# Patient Record
Sex: Female | Born: 1985 | Race: White | Hispanic: No | Smoking: Former smoker
Health system: Southern US, Community
[De-identification: ages and names within clinical notes are randomized; demographics above are authoritative.]

## PROBLEM LIST (undated history)

## (undated) ENCOUNTER — Inpatient Hospital Stay (HOSPITAL_COMMUNITY): Payer: Self-pay

## (undated) DIAGNOSIS — F32A Depression, unspecified: Secondary | ICD-10-CM

## (undated) DIAGNOSIS — I499 Cardiac arrhythmia, unspecified: Secondary | ICD-10-CM

## (undated) DIAGNOSIS — R569 Unspecified convulsions: Secondary | ICD-10-CM

## (undated) DIAGNOSIS — F419 Anxiety disorder, unspecified: Secondary | ICD-10-CM

## (undated) DIAGNOSIS — F329 Major depressive disorder, single episode, unspecified: Secondary | ICD-10-CM

## (undated) DIAGNOSIS — R5383 Other fatigue: Secondary | ICD-10-CM

## (undated) DIAGNOSIS — K648 Other hemorrhoids: Secondary | ICD-10-CM

## (undated) DIAGNOSIS — K8020 Calculus of gallbladder without cholecystitis without obstruction: Secondary | ICD-10-CM

## (undated) DIAGNOSIS — F4310 Post-traumatic stress disorder, unspecified: Secondary | ICD-10-CM

## (undated) DIAGNOSIS — R51 Headache: Secondary | ICD-10-CM

## (undated) DIAGNOSIS — E039 Hypothyroidism, unspecified: Secondary | ICD-10-CM

## (undated) DIAGNOSIS — O24419 Gestational diabetes mellitus in pregnancy, unspecified control: Secondary | ICD-10-CM

## (undated) HISTORY — DX: Calculus of gallbladder without cholecystitis without obstruction: K80.20

## (undated) HISTORY — DX: Other hemorrhoids: K64.8

## (undated) HISTORY — DX: Headache: R51

## (undated) HISTORY — DX: Hypothyroidism, unspecified: E03.9

## (undated) HISTORY — PX: CHOLECYSTECTOMY: SHX55

## (undated) HISTORY — DX: Depression, unspecified: F32.A

## (undated) HISTORY — PX: WISDOM TOOTH EXTRACTION: SHX21

## (undated) HISTORY — DX: Anxiety disorder, unspecified: F41.9

## (undated) HISTORY — DX: Major depressive disorder, single episode, unspecified: F32.9

## (undated) HISTORY — DX: Cardiac arrhythmia, unspecified: I49.9

## (undated) HISTORY — DX: Other fatigue: R53.83

## (undated) HISTORY — DX: Post-traumatic stress disorder, unspecified: F43.10

---
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
## 2005-02-12 ENCOUNTER — Emergency Department (HOSPITAL_COMMUNITY): Admission: EM | Admit: 2005-02-12 | Discharge: 2005-02-12 | Payer: Self-pay | Admitting: Emergency Medicine

## 2011-05-21 ENCOUNTER — Emergency Department (HOSPITAL_COMMUNITY)
Admission: EM | Admit: 2011-05-21 | Discharge: 2011-05-22 | Disposition: A | Discharge status: Home / Self Care | Payer: Federal, State, Local not specified - PPO | Attending: Emergency Medicine | Admitting: Emergency Medicine

## 2011-05-21 DIAGNOSIS — E669 Obesity, unspecified: Secondary | ICD-10-CM | POA: Insufficient documentation

## 2011-05-21 DIAGNOSIS — R079 Chest pain, unspecified: Secondary | ICD-10-CM | POA: Insufficient documentation

## 2011-05-21 DIAGNOSIS — G43909 Migraine, unspecified, not intractable, without status migrainosus: Secondary | ICD-10-CM | POA: Insufficient documentation

## 2011-05-21 DIAGNOSIS — E039 Hypothyroidism, unspecified: Secondary | ICD-10-CM | POA: Insufficient documentation

## 2011-05-21 DIAGNOSIS — R42 Dizziness and giddiness: Secondary | ICD-10-CM | POA: Insufficient documentation

## 2011-05-21 DIAGNOSIS — F411 Generalized anxiety disorder: Secondary | ICD-10-CM | POA: Insufficient documentation

## 2011-05-21 DIAGNOSIS — I498 Other specified cardiac arrhythmias: Secondary | ICD-10-CM | POA: Insufficient documentation

## 2011-05-22 ENCOUNTER — Emergency Department (HOSPITAL_COMMUNITY): Payer: Federal, State, Local not specified - PPO

## 2011-05-22 LAB — HCG, SERUM, QUALITATIVE: Preg, Serum: NEGATIVE

## 2011-05-22 LAB — POCT I-STAT, CHEM 8
BUN: 8 mg/dL (ref 6–23)
Calcium, Ion: 1.05 mmol/L — ABNORMAL LOW (ref 1.12–1.32)
Chloride: 110 mEq/L (ref 96–112)
Creatinine, Ser: 0.7 mg/dL (ref 0.50–1.10)
Glucose, Bld: 102 mg/dL — ABNORMAL HIGH (ref 70–99)
HCT: 35 % — ABNORMAL LOW (ref 36.0–46.0)
Hemoglobin: 11.9 g/dL — ABNORMAL LOW (ref 12.0–15.0)
Potassium: 3.8 mEq/L (ref 3.5–5.1)
Sodium: 138 mEq/L (ref 135–145)
TCO2: 18 mmol/L (ref 0–100)

## 2011-05-22 MED ORDER — IOHEXOL 300 MG/ML  SOLN
100.0000 mL | Freq: Once | INTRAMUSCULAR | Status: AC | PRN
  Administered 2011-05-22: 100 mL via INTRAVENOUS

## 2011-07-20 ENCOUNTER — Ambulatory Visit (INDEPENDENT_AMBULATORY_CARE_PROVIDER_SITE_OTHER): Payer: Federal, State, Local not specified - PPO | Admitting: Psychology

## 2011-07-20 DIAGNOSIS — F4310 Post-traumatic stress disorder, unspecified: Secondary | ICD-10-CM

## 2011-07-29 ENCOUNTER — Encounter (HOSPITAL_COMMUNITY): Payer: Federal, State, Local not specified - PPO | Admitting: Psychology

## 2011-08-03 ENCOUNTER — Encounter (INDEPENDENT_AMBULATORY_CARE_PROVIDER_SITE_OTHER): Payer: Federal, State, Local not specified - PPO | Admitting: Psychology

## 2011-08-03 DIAGNOSIS — F4310 Post-traumatic stress disorder, unspecified: Secondary | ICD-10-CM

## 2011-08-19 ENCOUNTER — Other Ambulatory Visit (HOSPITAL_COMMUNITY): Payer: Self-pay

## 2011-08-22 ENCOUNTER — Ambulatory Visit (INDEPENDENT_AMBULATORY_CARE_PROVIDER_SITE_OTHER): Payer: Federal, State, Local not specified - PPO | Admitting: Physician Assistant

## 2011-08-22 ENCOUNTER — Encounter (HOSPITAL_COMMUNITY): Payer: Self-pay | Admitting: Physician Assistant

## 2011-08-22 DIAGNOSIS — F4310 Post-traumatic stress disorder, unspecified: Secondary | ICD-10-CM

## 2011-08-22 DIAGNOSIS — F332 Major depressive disorder, recurrent severe without psychotic features: Secondary | ICD-10-CM

## 2011-08-22 DIAGNOSIS — F339 Major depressive disorder, recurrent, unspecified: Secondary | ICD-10-CM | POA: Insufficient documentation

## 2011-08-22 DIAGNOSIS — E039 Hypothyroidism, unspecified: Secondary | ICD-10-CM

## 2011-08-22 MED ORDER — ARIPIPRAZOLE 5 MG PO TABS: 5.0000 mg | ORAL_TABLET | Freq: Every day | ORAL | Status: DC

## 2011-08-22 MED ORDER — HYDROXYZINE HCL 25 MG PO TABS: 25.0000 mg | ORAL_TABLET | Freq: Three times a day (TID) | ORAL | Status: DC | PRN

## 2011-08-22 NOTE — Patient Instructions (Signed)
Pt. To get her labs today as well as her medication. She has card to contact us if her situation worsens.
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
## 2011-08-23 ENCOUNTER — Encounter (HOSPITAL_COMMUNITY): Payer: Self-pay | Admitting: Psychology

## 2011-08-23 ENCOUNTER — Ambulatory Visit (INDEPENDENT_AMBULATORY_CARE_PROVIDER_SITE_OTHER): Payer: Federal, State, Local not specified - PPO | Admitting: Psychology

## 2011-08-23 DIAGNOSIS — F4310 Post-traumatic stress disorder, unspecified: Secondary | ICD-10-CM
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
## 2011-08-30 ENCOUNTER — Telehealth (HOSPITAL_COMMUNITY): Payer: Self-pay | Admitting: Physician Assistant

## 2011-08-30 ENCOUNTER — Other Ambulatory Visit (HOSPITAL_COMMUNITY): Payer: Self-pay | Admitting: Physician Assistant

## 2011-08-31 LAB — CBC WITH DIFFERENTIAL/PLATELET
Basophils Relative: 0 % (ref 0–1)
Eosinophils Absolute: 0.1 10*3/uL (ref 0.0–0.7)
Lymphs Abs: 3.2 10*3/uL (ref 0.7–4.0)
MCH: 27.7 pg (ref 26.0–34.0)
Neutro Abs: 3.6 10*3/uL (ref 1.7–7.7)
Neutrophils Relative %: 49 % (ref 43–77)
Platelets: 333 10*3/uL (ref 150–400)
RBC: 4.59 MIL/uL (ref 3.87–5.11)

## 2011-08-31 LAB — COMPREHENSIVE METABOLIC PANEL
ALT: 26 U/L (ref 0–35)
Alkaline Phosphatase: 100 U/L (ref 39–117)
Sodium: 140 mEq/L (ref 135–145)
Total Bilirubin: 0.2 mg/dL — ABNORMAL LOW (ref 0.3–1.2)
Total Protein: 6.1 g/dL (ref 6.0–8.3)

## 2011-09-01 ENCOUNTER — Ambulatory Visit (INDEPENDENT_AMBULATORY_CARE_PROVIDER_SITE_OTHER): Payer: Federal, State, Local not specified - PPO | Admitting: Physician Assistant

## 2011-09-01 DIAGNOSIS — F4310 Post-traumatic stress disorder, unspecified: Secondary | ICD-10-CM

## 2011-09-01 MED ORDER — VITAMIN D3 1.25 MG (50000 UT) PO CAPS: 50000.0000 [IU] | ORAL_CAPSULE | ORAL | Status: DC

## 2011-09-01 MED ORDER — LEVOTHYROXINE SODIUM 112 MCG PO TABS: 100.0000 ug | ORAL_TABLET | Freq: Every day | ORAL | Status: DC

## 2011-09-06 ENCOUNTER — Ambulatory Visit (INDEPENDENT_AMBULATORY_CARE_PROVIDER_SITE_OTHER): Payer: Federal, State, Local not specified - PPO | Admitting: Psychology

## 2011-09-06 DIAGNOSIS — F4310 Post-traumatic stress disorder, unspecified: Secondary | ICD-10-CM

## 2011-09-06 NOTE — Progress Notes (Signed)
   THERAPIST PROGRESS NOTE  Session Time: 10.35am-11:20am  Participation Level: Active  Behavioral Response: Well GroomedAlertEuthymic  Type of Therapy: Individual Therapy  Treatment Goals addressed: Diagnosis: PTSD and goal1.   Interventions: CBT and Strength-based  Summary: Tekoa Hamor Rafati is a 25 y.o. female who presents with full and bright affect.  Pt reports improvement w/ her mood- less anxious, less depressed.  Pt informed that she has been sleeping better w/in the past week- sleeping at night, awake during the day- accomplished by keeping self up during the day.  Pt expressed insight that have mom out of town has been beneficial for her as feels like wanting to do things, feeling useful today w/ completing a morning routine and not worry about how mom is going to react to her going out.  Pt discussed feeling guilty when mom around following mom commenting on leaving her alone.  Pt committed to having self care activities w/ increased social activities this week.    Suicidal/Homicidal: Nowithout intent/plan  Therapist Response: assessed pt current functioning per her report.  Reflected pt improved mood and assisted in identifying contributing factors- including improved sleep.  Processed w/ pt interactions w/ mom and effect having on her mood and how to focus on her positive self care for her benefit w/ mood.   Plan: Return again in 1 weeks.  Diagnosis: Axis I: Post Traumatic Stress Disorder    Axis II: Deferred    Jaanai Salemi, LPC 09/06/2011

## 2011-09-13 ENCOUNTER — Ambulatory Visit (INDEPENDENT_AMBULATORY_CARE_PROVIDER_SITE_OTHER): Payer: Federal, State, Local not specified - PPO | Admitting: Psychology

## 2011-09-13 ENCOUNTER — Encounter (HOSPITAL_COMMUNITY): Payer: Self-pay | Admitting: Psychology

## 2011-09-13 DIAGNOSIS — F4310 Post-traumatic stress disorder, unspecified: Secondary | ICD-10-CM

## 2011-09-13 NOTE — Progress Notes (Signed)
   THERAPIST PROGRESS NOTE  Session Time: 11.30am-12:25pm  Participation Level: Active  Behavioral Response: Well GroomedAlertAnxious  Type of Therapy: Individual Therapy  Treatment Goals addressed: Diagnosis: PTSD and goal 1.  Interventions: CBT and Reframing  Summary: Maelys Kinnick Rafati is a 25 y.o. female who presents with full and bright affect.  Pt reports feeling stressed and still experiencing worry.  Pt reported w/ mom returning from out of town more stress as 'can't do anything right".  Pt reported on mom irritability and critical of what she is saying and doing.  Pt was able to increase awareness that this potential more reflection on mom's mood than on pt's actions.  Pt reported enjoying contact w/ her friend last week and felt very good to be social- w/ lack of transportation less ability this week.  Pt wants to return to school and financial barriers that getting in the way of making steps towards.  Pt discussed lack of joy about upcoming holidays as can't buy gifts for others.  Pt receptive to exploring other ways of giving that not material and creating a positive for self for the holidays.   Suicidal/Homicidal: Nowithout intent/plan  Therapist Response: Assessed pt current functioning per her report.  Processed w/pt interactions between pt and mom and explored external factors that pt not in control of.  Explored w/ pt positive self care w/ social interaction last week and how to best maintain given lack of transportation.  Processed w/ pt financial barriers and other options for assistance of attempting goals of returning to education.  Encourage pt to identify things grateful for and creating experiences for self that can look forward to.   Plan: Return again in 2-3 weeks.  Diagnosis: Axis I: Post Traumatic Stress Disorder    Axis II: Deferred    YATES,LEANNE, LPC 09/13/2011

## 2011-10-03 ENCOUNTER — Ambulatory Visit (INDEPENDENT_AMBULATORY_CARE_PROVIDER_SITE_OTHER): Payer: Federal, State, Local not specified - PPO | Admitting: Physician Assistant

## 2011-10-03 DIAGNOSIS — F4310 Post-traumatic stress disorder, unspecified: Secondary | ICD-10-CM

## 2011-10-03 MED ORDER — HYDROXYZINE HCL 25 MG PO TABS: 25.0000 mg | ORAL_TABLET | Freq: Three times a day (TID) | ORAL | Status: DC | PRN

## 2011-10-03 MED ORDER — FLUOXETINE HCL 40 MG PO CAPS: 40.0000 mg | ORAL_CAPSULE | Freq: Two times a day (BID) | ORAL | Status: DC

## 2011-10-03 NOTE — Progress Notes (Signed)
   [facility] Follow-up Outpatient Visit  Michele Armstrong 07-05-1986  Date: 10/03/11   Subjective: Michele Armstrong presents today to followup on her PTSD. She reports that she is having difficulty sleeping and when she does sleep she has nightmares. She is also having a difficult time going out in public. She especially has trouble being around men. The only social outlet she is participating in is to see her friend on a weekly basis, and when her friend's boyfriend comes over, Michele Armstrong feels the need to leave. She reports that she is once a day and that usually consists of a salad or other vegetables. She denies any suicidal or homicidal ideation. She denies any auditory or visual hallucinations. She is taking the Vistaril 3 times daily, but reports that it does not help with anxiety. She does find that it helps with her allergies. The Topamax prescribed to her by her primary care provider is for migraine prophylaxis. She has been on 40 mg of Prozac for about 7 months. She has tried Paxil in the past, but reports that that caused her to become irritable and aggressive. She also has tried Lexapro, but had side effects to it that were intolerable. She denies trying Celexa or Zoloft in the past  There were no vitals filed for this visit.  Mental Status Examination  Appearance: Well groomed and casually dressed Alert: Yes Attention: good  Cooperative: Yes Eye Contact: Good Speech: Clear and even Psychomotor Activity: Normal Memory/Concentration: Intact Oriented: person, place, time/date and situation Mood: Anxious Affect: Full Range Thought Processes and Associations: Linear Fund of Knowledge: Good Thought Content:  Insight: Fair Judgement: Good  Diagnosis: PTSD  Treatment Plan: We will increase her Prozac to a total of 80 mg a day and Michele Armstrong will return for followup in one month at that time, we may consider changing Prozac to another SSRI, and/or adding Minipress.  Jeren Dufrane, PA

## 2011-10-08 ENCOUNTER — Telehealth (HOSPITAL_COMMUNITY): Payer: Self-pay | Admitting: Physician Assistant

## 2011-10-10 NOTE — Telephone Encounter (Signed)
Left msg. For patient to call unit on Monday for results. Rona Ravens. Maninder Deboer [facility]  10/09/2011

## 2011-10-12 ENCOUNTER — Ambulatory Visit (INDEPENDENT_AMBULATORY_CARE_PROVIDER_SITE_OTHER): Payer: Federal, State, Local not specified - PPO | Admitting: Psychology

## 2011-10-12 ENCOUNTER — Other Ambulatory Visit (HOSPITAL_COMMUNITY): Payer: Self-pay | Admitting: Physician Assistant

## 2011-10-12 DIAGNOSIS — F4310 Post-traumatic stress disorder, unspecified: Secondary | ICD-10-CM

## 2011-10-12 MED ORDER — FLUOXETINE HCL 40 MG PO CAPS: 40.0000 mg | ORAL_CAPSULE | Freq: Two times a day (BID) | ORAL | Status: DC

## 2011-10-12 NOTE — Progress Notes (Addendum)
   THERAPIST PROGRESS NOTE  Session Time: 1pm- 1:50pm  Participation Level: Active  Behavioral Response: Well GroomedAlertEuthymic  Type of Therapy: Individual Therapy  Treatment Goals addressed: Diagnosis: PTSD and goal 1.  Interventions: CBT, Reframing and Other: Challenging Cognitive Distortions and Strength Based Tx.  Summary: Michele Armstrong is a 26 y.o. female who presents with full and bright affect.  Pt reports still struggling w/ anxiety, feeling also worthless and pt also reported some increased compulsive symptoms of counting.  Pt reported that she has maintained contact w/ her friend and reported visiting 2 x in past week- improvement.  Pt reported also positive interaction w/ 2 friends from past that were positive during the holidays.  Pt gained insight into the positive effects of social interactions as well as recent dancing for mood.  Pt made several statements of negative self talk in session about expectations had for self and not meeting "feelign as if failure".  Pt was able to increased awareness of negative impact of these statements, and reframe about efforts she is making.  Suicidal/Homicidal: Nowithout intent/plan  Therapist Response: Assessed pt current functioning per her report.  Processed w/pt interactions w/ friends and related to benefits for mood.  Explored w/ pt other positives that she can focus on for self to recognize efforts and challenge her cognitive distortions.  Reflected negative self statements in session pt making and encouraged pt reframing.  Plan: Return again in 1 weeks.  Diagnosis: Axis I: Post Traumatic Stress Disorder    Axis II: Deferred    Margretta Sidle 10/12/2011  [facility] Outpatient Therapist Documentation Restriction  Forde Radon, [facility] 11/15/2011

## 2011-10-19 ENCOUNTER — Ambulatory Visit (HOSPITAL_COMMUNITY): Payer: Federal, State, Local not specified - PPO | Admitting: Psychology

## 2011-10-19 DIAGNOSIS — F4310 Post-traumatic stress disorder, unspecified: Secondary | ICD-10-CM

## 2011-10-26 NOTE — Progress Notes (Signed)
Addended by: Webber Michiels M on: 10/26/2011 10:09 AM   Modules accepted: Level of Service  

## 2011-11-03 ENCOUNTER — Ambulatory Visit (INDEPENDENT_AMBULATORY_CARE_PROVIDER_SITE_OTHER): Payer: Federal, State, Local not specified - PPO | Admitting: Physician Assistant

## 2011-11-03 DIAGNOSIS — F4310 Post-traumatic stress disorder, unspecified: Secondary | ICD-10-CM

## 2011-11-03 MED ORDER — PRAZOSIN HCL 1 MG PO CAPS: 1.0000 mg | ORAL_CAPSULE | Freq: Every day | ORAL | Status: DC

## 2011-11-03 MED ORDER — SERTRALINE HCL 50 MG PO TABS: ORAL_TABLET | ORAL | Status: DC

## 2011-11-03 MED ORDER — HYDROXYZINE HCL 25 MG PO TABS: 25.0000 mg | ORAL_TABLET | Freq: Three times a day (TID) | ORAL | Status: DC | PRN

## 2011-11-07 NOTE — Progress Notes (Signed)
   [facility] Follow-up Outpatient Visit  Michele Armstrong [birth year]  Date: 11/03/11   Subjective: Michele Armstrong presents today to follow up on her medications for PTSD. She endorses increased depression and anxiety since her last visit. She reports she is not sleeping well and having nightmares. She reports that she has begun to drink thought to, approximately 1-1/2 cups, daily to assist with sleep. She denies any suicidal or homicidal ideation she denies any auditory or visual hallucinations.  There were no vitals filed for this visit.  Mental Status Examination  Appearance: Well groomed and casually dressed Alert: Yes Attention: good  Cooperative: Yes Eye Contact: Good Speech: Clear and even Psychomotor Activity: Normal Memory/Concentration: Intact Oriented: person, place, time/date and situation Mood: Anxious and Depressed Affect: Non-Congruent Thought Processes and Associations: Circumstantial and Logical Fund of Knowledge: Good Thought Content:  Insight: Fair Judgement: Good  Diagnosis: PTSD  Treatment Plan: We will change her Prozac to Zoloft, and a cross taper. We will also start her on prazosin for nightmares. She will followup in one month  Jacquan Savas, PA

## 2011-11-08 ENCOUNTER — Ambulatory Visit (INDEPENDENT_AMBULATORY_CARE_PROVIDER_SITE_OTHER): Payer: Federal, State, Local not specified - PPO | Admitting: Psychology

## 2011-11-08 DIAGNOSIS — F4310 Post-traumatic stress disorder, unspecified: Secondary | ICD-10-CM

## 2011-11-08 DIAGNOSIS — F339 Major depressive disorder, recurrent, unspecified: Secondary | ICD-10-CM
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
## 2011-11-22 ENCOUNTER — Ambulatory Visit (INDEPENDENT_AMBULATORY_CARE_PROVIDER_SITE_OTHER): Payer: Federal, State, Local not specified - PPO | Admitting: Psychology

## 2011-11-22 DIAGNOSIS — F4310 Post-traumatic stress disorder, unspecified: Secondary | ICD-10-CM

## 2011-11-22 DIAGNOSIS — F339 Major depressive disorder, recurrent, unspecified: Secondary | ICD-10-CM

## 2011-11-22 NOTE — Progress Notes (Signed)
   THERAPIST PROGRESS NOTE  Session Time: 10.35am-11:25am  Participation Level: Active  Behavioral Response: Well GroomedAlertDepressed  Type of Therapy: Individual Therapy  Treatment Goals addressed: Diagnosis: PTSD and goal 1.  Interventions: CBT and Reframing  Summary: Williemae Muriel Rafati is a 26 y.o. female who presents with depressed mood and affect.  Pt reported that struggling after recently reconnecting w/ a friend through face book and learning of his good fortunes this past year.  Pt in session was comparing herself to him and putting self down for not making same progress this year.  Pt was able to refocus w/ counselor assistance to recognize cognitive distortions, challenge self on these, identify progresses she is making and recognize positives of recent.  Pt discussed positive of getting past anxiety to get onto facebook and had good awareness of not reconnecting w/ unhealthy relationships, potential need to block others and how she is reconnecting w/ her positive supports.  Suicidal/Homicidal: Nowithout intent/plan  Therapist Response: Assessed pt current functioning per her report.  Processed w/ pt recent contact w/ friend through facebook, had pt identify cognitive distortions and assisted in reframing to focus on positives.  Warned against reconnecting w/ unhealthy relationships.  Plan: Return again in 1 weeks.  Diagnosis: Axis I: Major Depression, Recurrent  and Post Traumatic Stress Disorder    Axis II: No diagnosis    Rena Hunke, LPC 11/22/2011

## 2011-11-29 ENCOUNTER — Ambulatory Visit (HOSPITAL_COMMUNITY): Payer: Self-pay | Admitting: Psychology

## 2011-12-06 ENCOUNTER — Ambulatory Visit (INDEPENDENT_AMBULATORY_CARE_PROVIDER_SITE_OTHER): Payer: Federal, State, Local not specified - PPO | Admitting: Physician Assistant

## 2011-12-06 DIAGNOSIS — F331 Major depressive disorder, recurrent, moderate: Secondary | ICD-10-CM

## 2011-12-06 DIAGNOSIS — F4310 Post-traumatic stress disorder, unspecified: Secondary | ICD-10-CM

## 2011-12-06 MED ORDER — ALPRAZOLAM 0.5 MG PO TABS: 0.5000 mg | ORAL_TABLET | Freq: Two times a day (BID) | ORAL | Status: AC | PRN

## 2011-12-06 MED ORDER — PRAZOSIN HCL 1 MG PO CAPS: 1.0000 mg | ORAL_CAPSULE | Freq: Every day | ORAL | Status: DC

## 2011-12-06 MED ORDER — SERTRALINE HCL 100 MG PO TABS: 200.0000 mg | ORAL_TABLET | Freq: Every day | ORAL | Status: DC
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
## 2011-12-08 ENCOUNTER — Telehealth (HOSPITAL_COMMUNITY): Payer: Self-pay | Admitting: Psychology

## 2011-12-08 ENCOUNTER — Ambulatory Visit (HOSPITAL_COMMUNITY): Payer: Self-pay | Admitting: Psychology

## 2011-12-08 NOTE — Telephone Encounter (Signed)
Pt called crying and stated she is sorry to miss appointment this morning.  Pt was in hysterics when first on phone call and difficult to understand.  Pt was able to calm during phone call w/ counselor assistance.  She expressed struggling w/ a lot of anxiety anytime she attempts to leave the house.  Pt felt might need to be in hospital as anxiety so heightened.  Pt denied any SI or thoughts of life not worth living.  Counselor did inform of crisis services through assessment and how to access. Counselor encouraged pt to reschedule appointment and perhaps have a support accompany her.  Pt agreed.

## 2011-12-19 ENCOUNTER — Ambulatory Visit (INDEPENDENT_AMBULATORY_CARE_PROVIDER_SITE_OTHER): Payer: Federal, State, Local not specified - PPO | Admitting: Psychology

## 2011-12-19 DIAGNOSIS — F339 Major depressive disorder, recurrent, unspecified: Secondary | ICD-10-CM

## 2011-12-19 DIAGNOSIS — F4310 Post-traumatic stress disorder, unspecified: Secondary | ICD-10-CM
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
## 2011-12-26 ENCOUNTER — Ambulatory Visit (INDEPENDENT_AMBULATORY_CARE_PROVIDER_SITE_OTHER): Payer: Federal, State, Local not specified - PPO | Admitting: Psychology

## 2011-12-26 DIAGNOSIS — F4310 Post-traumatic stress disorder, unspecified: Secondary | ICD-10-CM

## 2011-12-26 DIAGNOSIS — F339 Major depressive disorder, recurrent, unspecified: Secondary | ICD-10-CM

## 2011-12-26 NOTE — Progress Notes (Signed)
   THERAPIST PROGRESS NOTE  Session Time: 11.25am-12:18pm  Participation Level: Active  Behavioral Response: CasualAlertDepressed and Euthymic  Type of Therapy: Individual Therapy  Treatment Goals addressed: Diagnosis: MDD, PTSD, and goal 1.  Interventions: CBT, Motivational Interviewing and Other: Positive Self Care.  Summary: Michele Armstrong is a 26 y.o. female who presents with generally bright affect.  Pt reported just getting over the stomach flu over the weekend and so feeling better, but first time out of the house.  Pt reported no longer feeling strong sexually urges.  Pt denies any manic symptoms.  Pt reports still feeling depressed moods- when counselor reflected bright affect- and not comfortable driving.  Pt did report enjoying watching criminal case on t.v. And feeling motivate to complete school and dream of forensic psychologist.  Pt however has difficulty acknowledging not making choices towards wellness w/ continued drug-alcohol use and that will eventual "pass this phase".  Pt agreed to use of positive self care w/ enjoyable activity today and need to foster contact w/ relationships of those who value her.  Suicidal/Homicidal: Nowithout intent/plan  Therapist Response: Assessed pt current functioning per her report.  Reflected pt brighter affect and potential contributing factors.  Discussed pt goals and barriers to reaching those goals.  Focused pt on identifying positive self care actions for the present and discussed need for healthy relationships.  Plan: Return again in 2 weeks. Pt to complete financial assistance paperwork for [place] and inquire about medicaid.  Diagnosis: Axis I: Post Traumatic Stress Disorder and MDD    Axis II: Deferred    Crystal Ellwood, LPC 12/26/2011

## 2012-01-09 ENCOUNTER — Ambulatory Visit (INDEPENDENT_AMBULATORY_CARE_PROVIDER_SITE_OTHER): Payer: Federal, State, Local not specified - PPO | Admitting: Psychology

## 2012-01-09 DIAGNOSIS — F339 Major depressive disorder, recurrent, unspecified: Secondary | ICD-10-CM

## 2012-01-09 DIAGNOSIS — F4310 Post-traumatic stress disorder, unspecified: Secondary | ICD-10-CM

## 2012-01-09 NOTE — Progress Notes (Signed)
   THERAPIST PROGRESS NOTE  Session Time: 10.30am-11:15am  Participation Level: Active  Behavioral Response: Well GroomedAlertEuthymic  Type of Therapy: Individual Therapy  Treatment Goals addressed: Diagnosis: MDD, PTSD and goal 1  Interventions: CBT and Other: Boundaries.  Summary: Michele Armstrong is a 26 y.o. female who presents with full and bright affect.  Pt reported that she is looking forward to the day and that it is a beautiful day. Pt reported that she has been "talking" w/ a guy met online for couple weeks now and face to face interactions for about 1.5 weeks.  Pt discussed relationship and enjoying connecting w/ him and feels could be serious relationship.  Pt denied any red flags w/ him- was aware of some w/ his mom who is around and pt reports w/ interactions and stories heard she has poor boundaries.  Pt reported that the relationship hasn't become sexual- showing affection w/ kissing and cuddling.  Pt expressed want to take slow in relationship and feels that he is receptive to this.  Pt reported wanting to get out of the house more, except when sick w/ virus, but improved self care w/ hygiene as well.  Pt denied any intrusive thoughts of trauma or feeling "on edge".    Suicidal/Homicidal: Nowithout intent/plan  Therapist Response: Assessed pt current functioning per her report.  Explored w/pt relationship developing that is potential boyfriend relationship.  Explored w/ pt boundaries and how pt is approaching relationship and any "redflags".    Plan: Return again in 2 weeks.  Diagnosis: Axis I: Major Depression, Recurrent  and Post Traumatic Stress Disorder    Axis II: Deferred    Kainen Struckman, LPC 01/09/2012

## 2012-01-10 ENCOUNTER — Ambulatory Visit (INDEPENDENT_AMBULATORY_CARE_PROVIDER_SITE_OTHER): Payer: Federal, State, Local not specified - PPO | Admitting: Physician Assistant

## 2012-01-10 DIAGNOSIS — F4310 Post-traumatic stress disorder, unspecified: Secondary | ICD-10-CM

## 2012-01-10 NOTE — Progress Notes (Signed)
   [facility] Follow-up Outpatient Visit  Michele Armstrong 02/26/1986  Date: 01/10/2012   Subjective: Michele Armstrong presents today to followup on medications prescribed for PTSD. She states that she is feeling much better since we increased her Zoloft to 200 mg daily. She states that her anxiety is much decreased and she rarely has to take a Xanax. She denies any symptoms of withdrawal from Xanax. She states that her sleep is great, and she eats one or 2 meals per day when she feels hungry. She is seeing a guy, but they have agreed to take it slow and there has been no sexual activity. She is watching a murder trial on CNN that has her very interested, and she is interested in becoming a Ecologist. She denies any suicidal or homicidal ideation she denies any auditory or visual hallucinations.  There were no vitals filed for this visit.  Mental Status Examination  Appearance: Well groomed and dressed Alert: Yes Attention: good  Cooperative: Yes Eye Contact: Good Speech: Clear and even Psychomotor Activity: Increased Memory/Concentration: Intact Oriented: person, place, time/date and situation Mood: Euphoric Affect: Bright Thought Processes and Associations: Goal Directed and Linear Fund of Knowledge: Good Thought Content: Normal Insight: Good Judgement: Good  Diagnosis: PTSD  Treatment Plan: We will continue the Zoloft at 200 mg daily, and prazosin 1 mg at bedtime. She can continue to take Xanax on an as-needed basis, with hopes that she will discontinue it. She will followup in 2 months. Angelica Wix, PA-C

## 2012-01-23 ENCOUNTER — Ambulatory Visit (INDEPENDENT_AMBULATORY_CARE_PROVIDER_SITE_OTHER): Payer: Federal, State, Local not specified - PPO | Admitting: Psychology

## 2012-01-23 DIAGNOSIS — F339 Major depressive disorder, recurrent, unspecified: Secondary | ICD-10-CM

## 2012-01-23 DIAGNOSIS — F4310 Post-traumatic stress disorder, unspecified: Secondary | ICD-10-CM

## 2012-01-23 NOTE — Progress Notes (Signed)
   THERAPIST PROGRESS NOTE  Session Time: 1:50pm-2:40pm  Participation Level: Active  Behavioral Response: Well GroomedAlertEuthymic  Type of Therapy: Individual Therapy  Treatment Goals addressed: Diagnosis: PTSD, MDD and goal 1.  Interventions: CBT and Other: Healthy Boundaries.  Summary: Michele Armstrong is a 26 y.o. female who presents with full and bright affect.  Pt reports she went to [facility] to visit a hookah bar for a reunion event of hookah bar peers from [place]. Pt reported some initial anxiety about w/ negative self talk about image and concern of being around others from past that ended relationships w/ negatively.  Pt reported w/ support of feeling connecting w/ positive friendships form past decided to go and had a very positive experience.  Pt discussed how positive interactions at reunion and had opportunity to reconnect w/ some positive supports that plans to maintain.  Pt denied any trauma related thoughts and reported feeling more social and getting out of the house.  Pt did report that her she has become sexually active w/ female who she has been hanging out w/ and interested in, but still feels that maintaining taking things slowly.    Suicidal/Homicidal: Nowithout intent/plan  Therapist Response: Assessed pt current functioning per pt report.  Processed w/pt feelings and outcomes of attending reunion and reflected positive connections made.  Explored potential for any negative interactions from past and whether any triggers for PTSD symptoms.  Encouraged pt to continue positive social interactions while maintaining healthy boundaries for safety planning and recovery.  Plan: Return again in 2 weeks.  Diagnosis: Axis I: PTSD and MDD    Axis II: Deferred    Demetrice Combes, LPC 01/23/2012

## 2012-02-06 ENCOUNTER — Ambulatory Visit (HOSPITAL_COMMUNITY): Payer: Self-pay | Admitting: Psychology

## 2012-03-12 ENCOUNTER — Ambulatory Visit (HOSPITAL_COMMUNITY): Payer: Self-pay | Admitting: Physician Assistant

## 2012-07-17 ENCOUNTER — Encounter (HOSPITAL_COMMUNITY): Payer: Self-pay | Admitting: Psychology

## 2012-07-17 DIAGNOSIS — F4310 Post-traumatic stress disorder, unspecified: Secondary | ICD-10-CM

## 2012-07-17 DIAGNOSIS — F339 Major depressive disorder, recurrent, unspecified: Secondary | ICD-10-CM

## 2012-07-17 NOTE — Progress Notes (Signed)
Outpatient Therapist Discharge Summary  DOROTHYMAE MACIVER    [birth year]   Admission Date: 07/20/11   Discharge Date:  07/17/12 Reason for Discharge:  Not active with treatment, multiple missed appointments. Diagnosis:    1. Depression, major, recurrent   2. PTSD (post-traumatic stress disorder)      Comments:  Last attended session on 01/09/12  Forde Radon

## 2014-04-23 NOTE — Progress Notes (Signed)
Patient has been dismissed.

## 2014-12-21 ENCOUNTER — Emergency Department (HOSPITAL_BASED_OUTPATIENT_CLINIC_OR_DEPARTMENT_OTHER)
Admission: EM | Admit: 2014-12-21 | Discharge: 2014-12-21 | Disposition: A | Discharge status: Home / Self Care | Payer: Federal, State, Local not specified - PPO | Attending: Emergency Medicine | Admitting: Emergency Medicine

## 2014-12-21 ENCOUNTER — Encounter (HOSPITAL_BASED_OUTPATIENT_CLINIC_OR_DEPARTMENT_OTHER): Payer: Self-pay | Admitting: Emergency Medicine

## 2014-12-21 DIAGNOSIS — Z3202 Encounter for pregnancy test, result negative: Secondary | ICD-10-CM | POA: Insufficient documentation

## 2014-12-21 DIAGNOSIS — F419 Anxiety disorder, unspecified: Secondary | ICD-10-CM | POA: Insufficient documentation

## 2014-12-21 DIAGNOSIS — Z79899 Other long term (current) drug therapy: Secondary | ICD-10-CM | POA: Insufficient documentation

## 2014-12-21 DIAGNOSIS — Z87891 Personal history of nicotine dependence: Secondary | ICD-10-CM | POA: Insufficient documentation

## 2014-12-21 DIAGNOSIS — K2970 Gastritis, unspecified, without bleeding: Secondary | ICD-10-CM

## 2014-12-21 DIAGNOSIS — F329 Major depressive disorder, single episode, unspecified: Secondary | ICD-10-CM | POA: Insufficient documentation

## 2014-12-21 DIAGNOSIS — E079 Disorder of thyroid, unspecified: Secondary | ICD-10-CM | POA: Insufficient documentation

## 2014-12-21 LAB — CBC WITH DIFFERENTIAL/PLATELET
BASOS ABS: 0 10*3/uL (ref 0.0–0.1)
Basophils Relative: 0 % (ref 0–1)
EOS ABS: 0 10*3/uL (ref 0.0–0.7)
EOS PCT: 0 % (ref 0–5)
HEMATOCRIT: 40.1 % (ref 36.0–46.0)
HEMOGLOBIN: 13.1 g/dL (ref 12.0–15.0)
LYMPHS PCT: 14 % (ref 12–46)
Lymphs Abs: 1.6 10*3/uL (ref 0.7–4.0)
MCH: 29.4 pg (ref 26.0–34.0)
MCHC: 32.7 g/dL (ref 30.0–36.0)
MCV: 89.9 fL (ref 78.0–100.0)
MONOS PCT: 6 % (ref 3–12)
Monocytes Absolute: 0.6 10*3/uL (ref 0.1–1.0)
NEUTROS ABS: 9.3 10*3/uL — AB (ref 1.7–7.7)
Neutrophils Relative %: 80 % — ABNORMAL HIGH (ref 43–77)
PLATELETS: 316 10*3/uL (ref 150–400)
RBC: 4.46 MIL/uL (ref 3.87–5.11)
RDW: 13.4 % (ref 11.5–15.5)
WBC: 11.6 10*3/uL — ABNORMAL HIGH (ref 4.0–10.5)

## 2014-12-21 LAB — URINALYSIS, ROUTINE W REFLEX MICROSCOPIC
BILIRUBIN URINE: NEGATIVE
GLUCOSE, UA: NEGATIVE mg/dL
Hgb urine dipstick: NEGATIVE
KETONES UR: 15 mg/dL — AB
LEUKOCYTES UA: NEGATIVE
Nitrite: NEGATIVE
Protein, ur: NEGATIVE mg/dL
SPECIFIC GRAVITY, URINE: 1.029 (ref 1.005–1.030)
Urobilinogen, UA: 0.2 mg/dL (ref 0.0–1.0)
pH: 6 (ref 5.0–8.0)

## 2014-12-21 LAB — COMPREHENSIVE METABOLIC PANEL
ALBUMIN: 3.6 g/dL (ref 3.5–5.2)
ALT: 14 U/L (ref 0–35)
ANION GAP: 13 (ref 5–15)
AST: 28 U/L (ref 0–37)
Alkaline Phosphatase: 75 U/L (ref 39–117)
BUN: 11 mg/dL (ref 6–23)
CALCIUM: 9.1 mg/dL (ref 8.4–10.5)
CHLORIDE: 106 mmol/L (ref 96–112)
CO2: 19 mmol/L (ref 19–32)
Creatinine, Ser: 0.68 mg/dL (ref 0.50–1.10)
GFR calc Af Amer: >90 mL/min (ref >90)
GFR calc non Af Amer: >90 mL/min (ref >90)
GLUCOSE: 163 mg/dL — AB (ref 70–99)
Potassium: 3.6 mmol/L (ref 3.5–5.1)
Sodium: 138 mmol/L (ref 135–145)
Total Bilirubin: 0.2 mg/dL — ABNORMAL LOW (ref 0.3–1.2)
Total Protein: 7.2 g/dL (ref 6.0–8.3)

## 2014-12-21 LAB — LIPASE, BLOOD: Lipase: 35 U/L (ref 11–59)

## 2014-12-21 LAB — PREGNANCY, URINE: Preg Test, Ur: NEGATIVE

## 2014-12-21 MED ORDER — LORAZEPAM 2 MG/ML IJ SOLN
1.0000 mg | Freq: Once | INTRAMUSCULAR | Status: AC
  Administered 2014-12-21: 1 mg via INTRAVENOUS
  Filled 2014-12-21: qty 1

## 2014-12-21 MED ORDER — SUCRALFATE 1 G PO TABS: 1.0000 g | ORAL_TABLET | Freq: Three times a day (TID) | ORAL | Status: DC

## 2014-12-21 MED ORDER — GI COCKTAIL ~~LOC~~
30.0000 mL | Freq: Once | ORAL | Status: AC
  Administered 2014-12-21: 30 mL via ORAL
  Filled 2014-12-21: qty 30

## 2014-12-21 MED ORDER — SODIUM CHLORIDE 0.9 % IV BOLUS (SEPSIS)
1000.0000 mL | Freq: Once | INTRAVENOUS | Status: AC
  Administered 2014-12-21: 1000 mL via INTRAVENOUS

## 2014-12-21 MED ORDER — FAMOTIDINE 20 MG PO TABS: 20.0000 mg | ORAL_TABLET | Freq: Two times a day (BID) | ORAL | Status: DC

## 2014-12-21 MED ORDER — PANTOPRAZOLE SODIUM 40 MG IV SOLR
40.0000 mg | Freq: Once | INTRAVENOUS | Status: AC
  Administered 2014-12-21: 40 mg via INTRAVENOUS
  Filled 2014-12-21: qty 40

## 2014-12-21 NOTE — ED Provider Notes (Signed)
CSN: [id number]     Arrival date & time 12/21/14  0746 History   First MD Initiated Contact with Patient 12/21/14 0809     Chief Complaint  Patient presents with  . Chest Pain     (Consider location/radiation/quality/duration/timing/severity/associated sxs/prior Treatment) HPI Comments: Patient presents with upper abdominal and chest pain. She has a history of anxiety and reflux. She states that she was previously taking anxiety medicine but she lost her insurance and is not prescribed her Klonopin anymore. She has been having an increase in anxiety but she denies any suicidal or homicidal ideations. She's been having some pain across her upper abdomen that radiates up into her chest with a burning into the center of her chest. She does have a history of gastroesophageal reflux disease. She's had some nausea and vomiting associated with the discomfort. She's had prior history of gallstones and is status post cholecystectomy. She denies any lower abdominal pain. She denies any urinary symptoms. She denies any change in her bowel habits.  Patient is a 29 y.o. female presenting with chest pain.  Chest Pain Associated symptoms: abdominal pain, nausea and vomiting   Associated symptoms: no back pain, no cough, no diaphoresis, no dizziness, no fatigue, no fever, no headache, no numbness, no shortness of breath and no weakness     Past Medical History  Diagnosis Date  . Hypothyroid   . Anxiety   . Arrhythmia   . Depression   . Fatigue   . Headache(784.0)   . PTSD (post-traumatic stress disorder)    Past Surgical History  Procedure Laterality Date  . Cholecystectomy     Family History  Problem Relation Age of Onset  . Alcohol abuse Father   . Drug abuse Father    History  Substance Use Topics  . Smoking status: Former Smoker    Types: Cigarettes  . Smokeless tobacco: Not on file  . Alcohol Use: 1.2 oz/week    2 Glasses of wine per week     Comment: Pt. notes that she smokes about  2 x a month, and has 1-2 glasses of wine every other week or so.   OB History    No data available     Review of Systems  Constitutional: Negative for fever, chills, diaphoresis and fatigue.  HENT: Negative for congestion, rhinorrhea and sneezing.   Eyes: Negative.   Respiratory: Negative for cough, chest tightness and shortness of breath.   Cardiovascular: Positive for chest pain. Negative for leg swelling.  Gastrointestinal: Positive for nausea, vomiting and abdominal pain. Negative for diarrhea and blood in stool.  Genitourinary: Negative for frequency, hematuria, flank pain and difficulty urinating.  Musculoskeletal: Negative for back pain and arthralgias.  Skin: Negative for rash.  Neurological: Negative for dizziness, speech difficulty, weakness, numbness and headaches.  Psychiatric/Behavioral: The patient is nervous/anxious.       Allergies  Morphine and related  Home Medications   Prior to Admission medications   Medication Sig Start Date End Date Taking? Authorizing Provider  Cholecalciferol (VITAMIN D3) 50000 UNITS CAPS Take 50,000 Units by mouth once a week. 09/01/11   Ruben Im, PA-C  famotidine (PEPCID) 20 MG tablet Take 1 tablet (20 mg total) by mouth 2 (two) times daily. 12/21/14   Malvin Johns, MD  hydrOXYzine (ATARAX/VISTARIL) 25 MG tablet Take 1 tablet (25 mg total) by mouth 3 (three) times daily as needed for itching. 11/03/11 11/13/11  Patrick Jupiter, PA-C  levothyroxine (SYNTHROID, LEVOTHROID) 112 MCG tablet Take 1  tablet (112 mcg total) by mouth daily. 09/01/11   Ruben Im, PA-C  metoprolol tartrate (LOPRESSOR) 25 MG tablet Take 25 mg by mouth. Pt. Takes 1/2 tablet a day.     Historical Provider, MD  Norgestimate-Ethinyl Estradiol Triphasic (ORTHO TRI-CYCLEN, 28,) 0.18/0.215/0.25 MG-35 MCG tablet Take 1 tablet by mouth daily.      Historical Provider, MD  prazosin (MINIPRESS) 1 MG capsule Take 1 capsule (1 mg total) by mouth at bedtime. 12/06/11 12/05/12   Patrick Jupiter, PA-C  sertraline (ZOLOFT) 100 MG tablet Take 2 tablets (200 mg total) by mouth daily. 12/06/11   Patrick Jupiter, PA-C  sucralfate (CARAFATE) 1 G tablet Take 1 tablet (1 g total) by mouth 4 (four) times daily -  with meals and at bedtime. 12/21/14   Malvin Johns, MD  topiramate (TOPAMAX) 100 MG tablet Take 100 mg by mouth. Pt. Takes 1 1/2 tabs a day.     Historical Provider, MD   BP 133/87 mmHg  Pulse 90  Temp(Src) 97.8 F (36.6 C) (Oral)  Resp 14  Ht 5\' 4"  (1.626 m)  Wt 200 lb (90.719 kg)  BMI 34.31 kg/m2  SpO2 99%  LMP  (LMP Unknown) Physical Exam  Constitutional: She is oriented to person, place, and time. She appears well-developed and well-nourished.  HENT:  Head: Normocephalic and atraumatic.  Eyes: Pupils are equal, round, and reactive to light.  Neck: Normal range of motion. Neck supple.  Cardiovascular: Normal rate, regular rhythm and normal heart sounds.   Pulmonary/Chest: Effort normal and breath sounds normal. No respiratory distress. She has no wheezes. She has no rales. She exhibits no tenderness (Positive tenderness to the center of the chest).  Abdominal: Soft. Bowel sounds are normal. There is tenderness (Positive tenderness to the epigastrium). There is no rebound and no guarding.  Musculoskeletal: Normal range of motion. She exhibits no edema.  No calf tenderness  Lymphadenopathy:    She has no cervical adenopathy.  Neurological: She is alert and oriented to person, place, and time.  Skin: Skin is warm and dry. No rash noted.  Psychiatric: She has a normal mood and affect.    ED Course  Procedures (including critical care time) Labs Review Labs Reviewed  COMPREHENSIVE METABOLIC PANEL - Abnormal; Notable for the following:    Glucose, Bld 163 (*)    Total Bilirubin 0.2 (*)    All other components within normal limits  CBC WITH DIFFERENTIAL/PLATELET - Abnormal; Notable for the following:    WBC 11.6 (*)    Neutrophils Relative % 80 (*)    Neutro Abs  9.3 (*)    All other components within normal limits  URINALYSIS, ROUTINE W REFLEX MICROSCOPIC - Abnormal; Notable for the following:    Ketones, ur 15 (*)    All other components within normal limits  LIPASE, BLOOD  PREGNANCY, URINE    Imaging Review No results found.   EKG Interpretation   Date/Time:  Sunday December 21 2014 07:50:40 EDT Ventricular Rate:  111 PR Interval:  136 QRS Duration: 60 QT Interval:  358 QTC Calculation: 486 R Axis:   53 Text Interpretation:  Sinus tachycardia Nonspecific ST and T wave  abnormality Abnormal ECG since last tracing no significant change  Confirmed by Donisha Hoch  MD, Nichole Keltner ([id number]) on 12/21/2014 7:55:54 AM      MDM   Final diagnoses:  Gastritis    Patient presents with epigastric pain radiating to her chest. It seems to be consistent with gastritis.  Her lipase is normal. Her glucose is a little bit elevated. Otherwise her labs are unremarkable. She is feeling much better after some IV fluids and Ativan. She also got a GI cocktail which remarkably helped her symptoms. She was discharged home in good condition. She was advised to follow-up with the primary care physician. I gave her a resource guide for outpatient follow-up. She was advised to return here if she has worsening symptoms. She was given prescription for Carafate and Pepcid.    Malvin Johns, MD 12/21/14 1352

## 2014-12-21 NOTE — Discharge Instructions (Signed)
Gastritis, Adult Gastritis is soreness and swelling (inflammation) of the lining of the stomach. Gastritis can develop as a sudden onset (acute) or long-term (chronic) condition. If gastritis is not treated, it can lead to stomach bleeding and ulcers. CAUSES  Gastritis occurs when the stomach lining is weak or damaged. Digestive juices from the stomach then inflame the weakened stomach lining. The stomach lining may be weak or damaged due to viral or bacterial infections. One common bacterial infection is the Helicobacter pylori infection. Gastritis can also result from excessive alcohol consumption, taking certain medicines, or having too much acid in the stomach.  SYMPTOMS  In some cases, there are no symptoms. When symptoms are present, they may include:  Pain or a burning sensation in the upper abdomen.  Nausea.  Vomiting.  An uncomfortable feeling of fullness after eating. DIAGNOSIS  Your caregiver may suspect you have gastritis based on your symptoms and a physical exam. To determine the cause of your gastritis, your caregiver may perform the following:  Blood or stool tests to check for the H pylori bacterium.  Gastroscopy. A thin, flexible tube (endoscope) is passed down the esophagus and into the stomach. The endoscope has a light and camera on the end. Your caregiver uses the endoscope to view the inside of the stomach.  Taking a tissue sample (biopsy) from the stomach to examine under a microscope. TREATMENT  Depending on the cause of your gastritis, medicines may be prescribed. If you have a bacterial infection, such as an H pylori infection, antibiotics may be given. If your gastritis is caused by too much acid in the stomach, H2 blockers or antacids may be given. Your caregiver may recommend that you stop taking aspirin, ibuprofen, or other nonsteroidal anti-inflammatory drugs (NSAIDs). HOME CARE INSTRUCTIONS  Only take over-the-counter or prescription medicines as directed by  your caregiver.  If you were given antibiotic medicines, take them as directed. Finish them even if you start to feel better.  Drink enough fluids to keep your urine clear or pale yellow.  Avoid foods and drinks that make your symptoms worse, such as:  Caffeine or alcoholic drinks.  Chocolate.  Peppermint or mint flavorings.  Garlic and onions.  Spicy foods.  Citrus fruits, such as oranges, lemons, or limes.  Tomato-based foods such as sauce, chili, salsa, and pizza.  Fried and fatty foods.  Eat small, frequent meals instead of large meals. SEEK IMMEDIATE MEDICAL CARE IF:   You have black or dark red stools.  You vomit blood or material that looks like coffee grounds.  You are unable to keep fluids down.  Your abdominal pain gets worse.  You have a fever.  You do not feel better after 1 week.  You have any other questions or concerns. MAKE SURE YOU:  Understand these instructions.  Will watch your condition.  Will get help right away if you are not doing well or get worse. Document Released: 09/13/2001 Document Revised: 03/20/2012 Document Reviewed: 11/02/2011 [facility] Patient Information 2015 [place], [place]. This information is not intended to replace advice given to you by your health care provider. Make sure you discuss any questions you have with your health care provider. Emergency Department Resource Guide 1) Find a Doctor and Pay Out of Pocket Although you won't have to find out who is covered by your insurance plan, it is a good idea to ask around and get recommendations. You will then need to call the office and see if the doctor you have chosen will  accept you as a new patient and what types of options they offer for patients who are self-pay. Some doctors offer discounts or will set up payment plans for their patients who do not have insurance, but you will need to ask so you aren't surprised when you get to your appointment.  2) Contact Your Local  Health Department Not all health departments have doctors that can see patients for sick visits, but many do, so it is worth a call to see if yours does. If you don't know where your local health department is, you can check in your phone book. The CDC also has a tool to help you locate your state's health department, and many state websites also have listings of all of their local health departments.  3) Find a [facility] If your illness is not likely to be very severe or complicated, you may want to try a walk in clinic. These are popping up all over the country in pharmacies, drugstores, and shopping centers. They're usually staffed by nurse practitioners or physician assistants that have been trained to treat common illnesses and complaints. They're usually fairly quick and inexpensive. However, if you have serious medical issues or chronic medical problems, these are probably not your best option.  No Primary Care Doctor: - Call Health Connect at  [phone] - they can help you locate a primary care doctor that  accepts your insurance, provides certain services, etc. - Physician Referral Service- [phone]  Chronic Pain Problems: Organization         Address     Phone             Notes  [facility]  [phone] Patients need to be referred by their primary care doctor.   Medication Assistance: Organization         Address     Phone             Notes  [facility] Medication [facility] [address] [place] [place], [place] [address] [phone] --Must be a resident of [facility] -- Must have NO insurance coverage whatsoever (no Medicaid/ Medicare, etc.) -- The pt. MUST have a primary care doctor that directs their care regularly and follows them in the community   Med[facility]ist  [phone]   [employer]  [phone]    Agencies that provide inexpensive medical care: Organization         Address     Phone             Notes  [place]  [phone]   [facility]    [phone]   [facility] [place], [place] [address] [phone]   [place] [address], [place] [phone]   Planned Parenthood    [phone]   [facility]    [phone]   [place] and [place] [address], [place] Phone:  [phone], Fax:  [phone] Hours of Operation:  9 am - 6 pm, M-F.  Also accepts Medicaid/Medicare and self-pay.  [facility] for [place], [address],  Phone: [phone], Fax: [phone]. Hours of Operation:  8:30 am - 5:30 pm, M-F.  Also accepts Medicaid and self-pay.  [facility] [address], [employer] Phone: [phone]   [place]  [address] [place], [place] [phone], Ext. [phone] Mondays & Thursdays: 7-9 AM.  First 15 patients are seen on a first come, first serve basis.   [facility] [address] [place] [address] [phone] Accepts Medicaid   [place] Providers:  Organization         Address     Phone             Notes  [facility] [address], [place] [phone] Also accepts self-pay patients.  [facility] [address] [place], [place]  [phone]   [place], [address], [place] [phone]   [facility] [address] [place] [phone]   Lucianne Lei [address], [place]   [phone] Only accepts [place] Access [place] patients after they have their name applied to their card.   Self-Pay (no insurance) in [facility]:  Organization         Address     Phone             Notes  Sickle Cell Patients, [facility] [place] [phone]   [facility] [place] [phone]   [facility] [place]  [place], [place],  [phone]   [facility]/Dr. Osei-Bonsu  [address], [place] or [address], [place] [phone] Phone number for both [place] and [place] locations is the same.  Urgent Medical and [facility] [address], [place] [phone]   [facility] [address], [place] or [address] [phone]   [facility] [address] [place] [phone], phone; [phone], fax Sees patients 1st and 3rd Saturday of every month.  Must not qualify for public or private insurance (i.e. Medicaid, Medicare, [place] Health Choice, Veterans' Benefits)  Household income should be no more than 200% of the poverty level The clinic cannot treat you if you are pregnant or think you are pregnant  Sexually transmitted diseases are not treated at the clinic.    Dental Care:  Organization         Address     Phone             Notes  [facility] Department of [facility] [place] [phone] Accepts children up to age 37 who are enrolled in [place] or [place]; pregnant women with a Medicaid card; and children who have applied for Medicaid or [place] Health Choice, but were declined, whose parents can pay a reduced fee at time of service.  [facility] Department of [facility]  [address], [place] [phone] Accepts children up to age 55 who are enrolled in [place] or [place]; pregnant women with a Medicaid card; and children who have applied for Medicaid or  Health Choice, but were declined, whose parents can pay a reduced fee at time of service.  [facility] Adult Dental Access PROGRAM  [place] [phone] Patients are seen by appointment only. Walk-ins are not accepted. [place] will see patients 105 years of age and older. Monday - Tuesday (  8am-5pm) Most Wednesdays (8:30-5pm) $30 per visit, cash only  [facility] Adult Dental Access PROGRAM  [address], [facility] [phone] Patients are seen by appointment only. Walk-ins are not accepted. [place] will see patients 85 years of age and older. One Wednesday Evening (Monthly: Volunteer Based).  $30 per visit, cash only  [place]  [phone] for adults; Children under age 89, call Graduate Pediatric Dentistry at [phone]. Children aged 41-14, please call [phone] to request a pediatric application.  Dental services are provided in all areas of dental care including fillings, crowns and bridges, complete and partial dentures, implants, gum treatment, root canals, and extractions. Preventive care is also provided. Treatment is provided to both adults and children. Patients are selected via a lottery and there is often a waiting list.   [facility] [address] [place]  [phone] [email or web address]   [facility] [address] [place], [place] [phone], Ext. [phone] Second and Fourth Thursday of each month, opens at 6:30 AM; Clinic ends at 9 AM.  Patients are seen on a first-come first-served basis, and a limited number are seen during each clinic.   [facility]  [address] [place] Danker [place], [place] [phone]   Eligibility Requirements You must have lived in [place], [place], or [place] counties for at least the last three months.   You cannot be eligible for state or federal sponsored [employer], including [employer], [place], or [employer].   You generally cannot be eligible for healthcare insurance through your employer.    How to apply: Eligibility screenings are held every Tuesday and  Wednesday afternoon from 1:00 pm until 4:00 pm. You do not need an appointment for the interview!  [facility] [address] [place], [place]   [place]  [place] Department  [place]  [phone]    Behavioral Health Resources in the Community: Intensive Outpatient Programs Organization         Address     Phone             Notes  [place]. [address], [place], [place] [phone]   [facility] [address], [place], [place]   ADS: Alcohol & Drug Svcs [address], [place], [place]   [place] [address]  [place], [place] or [phone]     Substance Abuse Resources Organization         Address     Phone             Notes  Alcohol and Drug Services  [phone]   [place]  [phone]   [facility] [place]   Chinita Pester  [phone]   Residential & Outpatient Substance Abuse Program  [phone]   Psychological Services Organization         Address     Phone             Notes  [facility] [place]  [place]  [phone]   [place] [address], [place] or [phone]    Mobile Crisis Teams Organization         Address     Phone  Notes  [facility], Mobile Crisis Care Unit  [phone]   [facility]ive Psychotherapeutic Services  [address] [place], [place]   [facility] [address] [place] [phone]    Self-Help/Support Groups Organization         Address     Phone             Notes  Mental Health Assoc. of [place] - variety of support groups  [place] Call for more information  Narcotics Anonymous (NA), [facility] [address], [place]  2 meetings at this location   Special educational needs teacher         Address     Phone             Notes  [facility] Residential Treatment [place],    [place]  [phone]   [facility]  [address], [place] [address], [place], [place]   Benoit Wright, [place] [phone] Admissions: 8am-3pm M-F  Incentives Substance [place] [address],    [place], [place] [phone]   [facility] [address] [place], [place], [place]   [facility] [address],  [place], [place]   [facility] Programs - Intensive Outpatient [address] Kristeen [address], [place], [place]   [facility] [place].,  [place], [place] [phone] or [phone]   [facility] (RTS) [address] [place], [place] Accepts Medicaid  [facility] [place] [address] [place] [address],  [place] [phone] Substance Abuse/Addiction Treatment   [facility] Organization         Address     Phone             Notes  [facility]  [phone]   Domenic Schwab, PhD [address] [place]lis [place], [place]   [phone] or [phone]   [place], [place] [phone]   [facility] [address], [place], [place] [phone] Insurance/Medicaid/sponsorship through [place] [facility] and Families [address]                                    [place], [place] [phone] [place] [address], [place] [phone]    Dr. Adele Schilder  [phone]   [facility] Dept. 1) [address] [place] 2) [place] 3)  [address], [place] [phone]  [phone]   [place] [phone] or [phone] (After Hours)

## 2014-12-21 NOTE — ED Notes (Addendum)
Pt reports abdominal pain which began 3 days ago.  Reports this pain has now raised into her chest. Reports nausea which occurred when she had abdominal pain and persists this morning.  Denies nausea at present. Reports a "heaviness in my chest" which began 2 days ago.  Reports the chest pain is mid chest and radiates down into left lower chest. Reports shortness of breath which began last night.  Reports pain on inspiration and exhalation.  Patient noted to have rapid deep breathing until laying in bed.  Patient currently displays no signs of labored breathing while laying in bed during triage.  Rate is regular-14 breaths per minute. Patient reports her normal resting heart rate is between 115-120 and that her currently HR of 88 is "low for me".

## 2016-07-06 ENCOUNTER — Inpatient Hospital Stay (HOSPITAL_COMMUNITY)
Admission: AD | Admit: 2016-07-06 | Discharge: 2016-07-06 | Disposition: A | Discharge status: Home / Self Care | Payer: Medicaid Other | Source: Ambulatory Visit | Attending: Obstetrics & Gynecology | Admitting: Obstetrics & Gynecology

## 2016-07-06 ENCOUNTER — Encounter (HOSPITAL_COMMUNITY): Payer: Self-pay | Admitting: *Deleted

## 2016-07-06 DIAGNOSIS — Z3A21 21 weeks gestation of pregnancy: Secondary | ICD-10-CM | POA: Insufficient documentation

## 2016-07-06 DIAGNOSIS — O210 Mild hyperemesis gravidarum: Secondary | ICD-10-CM | POA: Insufficient documentation

## 2016-07-06 DIAGNOSIS — K529 Noninfective gastroenteritis and colitis, unspecified: Secondary | ICD-10-CM | POA: Diagnosis not present

## 2016-07-06 DIAGNOSIS — R197 Diarrhea, unspecified: Secondary | ICD-10-CM | POA: Diagnosis present

## 2016-07-06 DIAGNOSIS — Z87891 Personal history of nicotine dependence: Secondary | ICD-10-CM | POA: Insufficient documentation

## 2016-07-06 LAB — URINALYSIS, ROUTINE W REFLEX MICROSCOPIC
Bilirubin Urine: NEGATIVE
Glucose, UA: NEGATIVE mg/dL
HGB URINE DIPSTICK: NEGATIVE
KETONES UR: NEGATIVE mg/dL
Leukocytes, UA: NEGATIVE
Nitrite: NEGATIVE
PROTEIN: NEGATIVE mg/dL
Specific Gravity, Urine: 1.015 (ref 1.005–1.030)
pH: 8 (ref 5.0–8.0)

## 2016-07-06 MED ORDER — PROMETHAZINE HCL 25 MG PO TABS
25.0000 mg | ORAL_TABLET | Freq: Once | ORAL | Status: AC
  Administered 2016-07-06: 25 mg via ORAL
  Filled 2016-07-06: qty 1

## 2016-07-06 MED ORDER — PROMETHAZINE HCL 25 MG PO TABS: 25.0000 mg | ORAL_TABLET | Freq: Four times a day (QID) | ORAL | 2 refills | Status: DC | PRN

## 2016-07-06 MED ORDER — ONDANSETRON 4 MG PO TBDP
4.0000 mg | ORAL_TABLET | Freq: Once | ORAL | Status: AC
  Administered 2016-07-06: 4 mg via ORAL
  Filled 2016-07-06: qty 1

## 2016-07-06 NOTE — MAU Provider Note (Signed)
Chief Complaint:  Emesis and Diarrhea   First Provider Initiated Contact with Patient 07/06/16 1147     HPI: Michele Armstrong is a 30 y.o. G2P0010 at 34w0dwho presents to maternity admissions reporting vomiting frequently for 4 days  Started having diarrhea today.  No sick contacts   Is able to keep down fluids.. She reports good fetal movement, denies LOF, vaginal bleeding, vaginal itching/burning, urinary symptoms, h/a, dizziness, constipation or fever/chills.  She denies headache, visual changes or RUQ abdominal pain.  Emesis   This is a new problem. The current episode started in the past 7 days. The problem occurs 5 to 10 times per day. The problem has been unchanged. There has been no fever. Associated symptoms include diarrhea. Pertinent negatives include no abdominal pain, chills, coughing, dizziness, fever, headaches or myalgias. She has tried nothing for the symptoms.  Diarrhea   This is a new problem. The current episode started today. The problem occurs 2 to 4 times per day. The problem has been unchanged. The patient states that diarrhea does not awaken her from sleep. Associated symptoms include vomiting. Pertinent negatives include no abdominal pain, chills, coughing, fever, headaches or myalgias. Nothing aggravates the symptoms. There are no known risk factors. She has tried nothing for the symptoms.   RN Note: Pt started vomiting 4 days ago, pt states "10 times a day" diarrhea started this morning, three times this morning.  Denies LOF/VB.  Past Medical History: Past Medical History:  Diagnosis Date  . Anxiety   . Arrhythmia   . Depression   . Fatigue   . Headache(784.0)   . Hypothyroid   . PTSD (post-traumatic stress disorder)     Past obstetric history: OB History  Gravida Para Term Preterm AB Living  2       1 0  SAB TAB Ectopic Multiple Live Births        0      # Outcome Date GA Lbr Len/2nd Weight Sex Delivery Anes PTL Lv  2 Current           1 AB               Past Surgical History: Past Surgical History:  Procedure Laterality Date  . CHOLECYSTECTOMY      Family History: Family History  Problem Relation Age of Onset  . Alcohol abuse Father   . Drug abuse Father     Social History: Social History  Substance Use Topics  . Smoking status: Former Smoker    Types: Cigarettes  . Smokeless tobacco: Not on file  . Alcohol use 1.2 oz/week    2 Glasses of wine per week     Comment: Pt. notes that she smokes about 2 x a month, and has 1-2 glasses of wine every other week or so.    Allergies:  Allergies  Allergen Reactions  . Morphine And Related Nausea And Vomiting    Meds:  Prescriptions Prior to Admission  Medication Sig Dispense Refill Last Dose  . Cholecalciferol (VITAMIN D3) 50000 UNITS CAPS Take 50,000 Units by mouth once a week. 8 capsule 0 Unknown at Unknown time  . famotidine (PEPCID) 20 MG tablet Take 1 tablet (20 mg total) by mouth 2 (two) times daily. 30 tablet 0   . hydrOXYzine (ATARAX/VISTARIL) 25 MG tablet Take 1 tablet (25 mg total) by mouth 3 (three) times daily as needed for itching. 90 tablet 3 Taking  . levothyroxine (SYNTHROID, LEVOTHROID) 112 MCG tablet Take 1 tablet (  112 mcg total) by mouth daily. 30 tablet 2 Unknown at Unknown time  . metoprolol tartrate (LOPRESSOR) 25 MG tablet Take 25 mg by mouth. Pt. Takes 1/2 tablet a day.    Unknown at Unknown time  . Norgestimate-Ethinyl Estradiol Triphasic (ORTHO TRI-CYCLEN, 28,) 0.18/0.215/0.25 MG-35 MCG tablet Take 1 tablet by mouth daily.     Taking  . prazosin (MINIPRESS) 1 MG capsule Take 1 capsule (1 mg total) by mouth at bedtime. 30 capsule 3 Taking  . sertraline (ZOLOFT) 100 MG tablet Take 2 tablets (200 mg total) by mouth daily. 60 tablet 3 Unknown at Unknown time  . sucralfate (CARAFATE) 1 G tablet Take 1 tablet (1 g total) by mouth 4 (four) times daily -  with meals and at bedtime. 30 tablet 0   . topiramate (TOPAMAX) 100 MG tablet Take 100 mg by mouth. Pt.  Takes 1 1/2 tabs a day.    Unknown at Unknown time    I have reviewed patient's Past Medical Hx, Surgical Hx, Family Hx, Social Hx, medications and allergies.   ROS:  Review of Systems  Constitutional: Negative for chills and fever.  Respiratory: Negative for cough.   Gastrointestinal: Positive for diarrhea and vomiting. Negative for abdominal pain.  Musculoskeletal: Negative for myalgias.  Neurological: Negative for dizziness and headaches.   Other systems negative  Physical Exam  Patient Vitals for the past 24 hrs:  BP Temp Temp src Pulse Resp SpO2  07/06/16 1118 122/57 97.2 F (36.2 C) Oral 83 16 99 %   Constitutional: Well-developed, well-nourished female in no acute distress.  Cardiovascular: normal rate and rhythm Respiratory: normal effort, clear to auscultation bilaterally GI: Abd soft, non-tender, gravid appropriate for gestational age.   No rebound or guarding. MS: Extremities nontender, no edema, normal ROM Neurologic: Alert and oriented x 4.  GU: Neg CVAT.    FHT:  145   Labs: Results for orders placed or performed during the hospital encounter of 07/06/16 (from the past 72 hour(s))  Urinalysis, Routine w reflex microscopic (not at [facility])     Status: None   Collection Time: 07/06/16 11:10 AM  Result Value Ref Range   Color, Urine YELLOW YELLOW   APPearance CLEAR CLEAR   Specific Gravity, Urine 1.015 1.005 - 1.030   pH 8.0 5.0 - 8.0   Glucose, UA NEGATIVE NEGATIVE mg/dL   Hgb urine dipstick NEGATIVE NEGATIVE   Bilirubin Urine NEGATIVE NEGATIVE   Ketones, ur NEGATIVE NEGATIVE mg/dL   Protein, ur NEGATIVE NEGATIVE mg/dL   Nitrite NEGATIVE NEGATIVE   Leukocytes, UA NEGATIVE NEGATIVE    Comment: MICROSCOPIC NOT DONE ON URINES WITH NEGATIVE PROTEIN, BLOOD, LEUKOCYTES, NITRITE, OR GLUCOSE <1000 mg/dL.    Imaging:  No results found.  MAU Course/MDM: I have ordered labs and reviewed results. Urine showed good hydration, no need for IV fluids  Treatments in  MAU included Phenergan tablet for nausea, which she vomited up,. So I gave her a Zofran ODT 4mg  which helped her keep fluids and crackers down.  .    Assessment: SIUP at [redacted]w[redacted]d Probable viral gastroenteritis, given new vomiting and diarrhea No evidence of dehydration  Plan: Discharge home Advance diet as tolerated Rx Phenergan for vomiting Preterm Labor precautions and fetal kick counts Follow up in Office for prenatal visits and recheck  Encouraged to return here or to other Urgent Care/ED if she develops worsening of symptoms, increase in pain, fever, or other concerning symptoms.   Pt stable at time of discharge.  Hansel Feinstein CNM, MSN Certified Nurse-Midwife 07/06/2016 11:47 AM

## 2016-07-06 NOTE — Discharge Instructions (Signed)

## 2016-07-06 NOTE — MAU Note (Signed)
Pt started vomiting 4 days ago, pt states "10 times a day" diarrhea started this morning, three times this morning.  Denies LOF/VB.

## 2016-07-18 ENCOUNTER — Encounter: Payer: Self-pay | Admitting: *Deleted

## 2016-07-18 ENCOUNTER — Encounter: Payer: Self-pay | Admitting: Obstetrics & Gynecology

## 2016-07-18 ENCOUNTER — Ambulatory Visit (INDEPENDENT_AMBULATORY_CARE_PROVIDER_SITE_OTHER): Payer: Medicaid Other | Admitting: Obstetrics & Gynecology

## 2016-07-18 DIAGNOSIS — Z3490 Encounter for supervision of normal pregnancy, unspecified, unspecified trimester: Secondary | ICD-10-CM | POA: Insufficient documentation

## 2016-07-18 DIAGNOSIS — Z3483 Encounter for supervision of other normal pregnancy, third trimester: Secondary | ICD-10-CM

## 2016-07-18 NOTE — Progress Notes (Signed)
   PRENATAL VISIT NOTE  Subjective:  Michele Armstrong is a 30 y.o. G2P0010 at [redacted]w[redacted]d being seen today for initiation of prenatal care with our office; transferred from [facility] as this office is closer to her and for insurance issues.  She is accompanied by her FOB. She is currently monitored for the following issues for this low-risk pregnancy and has PTSD (post-traumatic stress disorder); Depression, major, recurrent ([place]); and Hypothyroid on her problem list.  Patient reports rare pelvic cramping and bilateral breast pain. Breast pain was evaluated by her previous OB; felt to be secondary to pregnancy. No masses or abnormal drainage.  Pelvic pain was also thought to be due to round ligament stretching. Previous prenatal visits reviewed via Care Everywhere.   . Vag. Bleeding: None.  Movement: Present. Denies leaking of fluid.   The following portions of the patient's history were reviewed and updated as appropriate: allergies, current medications, past family history, past medical history, past social history, past surgical history and problem list. Problem list updated.  Objective:   Vitals:   07/18/16 1349  BP: 115/75  Pulse: (!) 112  Weight: 191 lb (86.6 kg)    Fetal Status: Fetal Heart Rate (bpm): 145lb Fundal Height: 22 cm Movement: Present     General:  Alert, oriented and cooperative. Patient is in no acute distress.  Skin: Skin is warm and dry. No rash noted.   Cardiovascular: Normal heart rate noted  Respiratory: Normal respiratory effort, no problems with respiration noted  Abdomen: Soft, gravid, appropriate for gestational age. Pain/Pressure: Absent     Pelvic:  Cervical exam deferred        Extremities: Normal range of motion.  Edema: None  Mental Status: Normal mood and affect. Normal behavior. Normal judgment and thought content.   On review of labs/studies; patient had normal prenatal labs, normal first trimester screen and quad screen, normal anatomy scan.     Assessment and Plan:  Pregnancy: G2P0010 at [redacted]w[redacted]d Encounter for supervision of other normal pregnancy in third trimester The nature of [facility] with multiple MDs and other Advanced Practice Providers was explained to patient; also emphasized that residents, students are part of our team. Preterm labor symptoms and general obstetric precautions including but not limited to vaginal bleeding, contractions, leaking of fluid and fetal movement were reviewed in detail with the patient. Please refer to After Visit Summary for other counseling recommendations.  Return in about 4 weeks (around 08/15/2016) for OB Visit.    Osborne Oman, MD

## 2016-07-18 NOTE — Patient Instructions (Signed)
Return to clinic for any scheduled appointments or obstetric concerns, or go to [facility] for evaluation  

## 2016-07-24 ENCOUNTER — Encounter (HOSPITAL_BASED_OUTPATIENT_CLINIC_OR_DEPARTMENT_OTHER): Payer: Self-pay | Admitting: *Deleted

## 2016-07-24 ENCOUNTER — Emergency Department (HOSPITAL_BASED_OUTPATIENT_CLINIC_OR_DEPARTMENT_OTHER)
Admission: EM | Admit: 2016-07-24 | Discharge: 2016-07-24 | Disposition: A | Discharge status: Home / Self Care | Payer: Medicaid Other | Attending: Emergency Medicine | Admitting: Emergency Medicine

## 2016-07-24 DIAGNOSIS — Z79899 Other long term (current) drug therapy: Secondary | ICD-10-CM | POA: Diagnosis not present

## 2016-07-24 DIAGNOSIS — Z3A23 23 weeks gestation of pregnancy: Secondary | ICD-10-CM | POA: Insufficient documentation

## 2016-07-24 DIAGNOSIS — O219 Vomiting of pregnancy, unspecified: Secondary | ICD-10-CM

## 2016-07-24 DIAGNOSIS — Z87891 Personal history of nicotine dependence: Secondary | ICD-10-CM | POA: Diagnosis not present

## 2016-07-24 DIAGNOSIS — E039 Hypothyroidism, unspecified: Secondary | ICD-10-CM | POA: Insufficient documentation

## 2016-07-24 HISTORY — DX: Unspecified convulsions: R56.9

## 2016-07-24 LAB — CBC WITH DIFFERENTIAL/PLATELET
BAND NEUTROPHILS: 1 %
Basophils Absolute: 0 10*3/uL (ref 0.0–0.1)
Basophils Relative: 0 %
Blasts: 0 %
EOS ABS: 0 10*3/uL (ref 0.0–0.7)
Eosinophils Relative: 0 %
HEMATOCRIT: 33.7 % — AB (ref 36.0–46.0)
HEMOGLOBIN: 11.3 g/dL — AB (ref 12.0–15.0)
Lymphocytes Relative: 11 %
Lymphs Abs: 1.8 10*3/uL (ref 0.7–4.0)
MCH: 30.1 pg (ref 26.0–34.0)
MCHC: 33.5 g/dL (ref 30.0–36.0)
MCV: 89.9 fL (ref 78.0–100.0)
MYELOCYTES: 1 %
Metamyelocytes Relative: 1 %
Monocytes Absolute: 0.3 10*3/uL (ref 0.1–1.0)
Monocytes Relative: 2 %
NEUTROS PCT: 84 %
NRBC: 0 /100{WBCs}
Neutro Abs: 14.2 10*3/uL — ABNORMAL HIGH (ref 1.7–7.7)
PROMYELOCYTES ABS: 0 %
Platelets: 312 10*3/uL (ref 150–400)
RBC: 3.75 MIL/uL — ABNORMAL LOW (ref 3.87–5.11)
RDW: 13.7 % (ref 11.5–15.5)
WBC: 16.3 10*3/uL — ABNORMAL HIGH (ref 4.0–10.5)

## 2016-07-24 LAB — COMPREHENSIVE METABOLIC PANEL
ALK PHOS: 61 U/L (ref 38–126)
ALT: 13 U/L — ABNORMAL LOW (ref 14–54)
AST: 16 U/L (ref 15–41)
Albumin: 2.9 g/dL — ABNORMAL LOW (ref 3.5–5.0)
Anion gap: 8 (ref 5–15)
BUN: 8 mg/dL (ref 6–20)
CO2: 21 mmol/L — AB (ref 22–32)
Calcium: 8.6 mg/dL — ABNORMAL LOW (ref 8.9–10.3)
Chloride: 108 mmol/L (ref 101–111)
Creatinine, Ser: 0.48 mg/dL (ref 0.44–1.00)
GFR calc non Af Amer: >60 mL/min (ref >60)
Glucose, Bld: 106 mg/dL — ABNORMAL HIGH (ref 65–99)
Potassium: 3.4 mmol/L — ABNORMAL LOW (ref 3.5–5.1)
SODIUM: 137 mmol/L (ref 135–145)
Total Bilirubin: 0.2 mg/dL — ABNORMAL LOW (ref 0.3–1.2)
Total Protein: 6.2 g/dL — ABNORMAL LOW (ref 6.5–8.1)

## 2016-07-24 LAB — URINALYSIS, ROUTINE W REFLEX MICROSCOPIC
Bilirubin Urine: NEGATIVE
Glucose, UA: NEGATIVE mg/dL
Hgb urine dipstick: NEGATIVE
Ketones, ur: NEGATIVE mg/dL
LEUKOCYTES UA: NEGATIVE
Nitrite: NEGATIVE
PH: 8 (ref 5.0–8.0)
Protein, ur: NEGATIVE mg/dL
Specific Gravity, Urine: 1.005 (ref 1.005–1.030)

## 2016-07-24 MED ORDER — ONDANSETRON HCL 4 MG/2ML IJ SOLN
4.0000 mg | Freq: Once | INTRAMUSCULAR | Status: AC
  Administered 2016-07-24: 4 mg via INTRAVENOUS
  Filled 2016-07-24: qty 2

## 2016-07-24 MED ORDER — PROMETHAZINE HCL 25 MG RE SUPP: 25.0000 mg | Freq: Four times a day (QID) | RECTAL | 0 refills | Status: DC | PRN

## 2016-07-24 MED ORDER — SODIUM CHLORIDE 0.9 % IV BOLUS (SEPSIS)
1000.0000 mL | Freq: Once | INTRAVENOUS | Status: AC
  Administered 2016-07-24: 1000 mL via INTRAVENOUS

## 2016-07-24 MED ORDER — ONDANSETRON 4 MG PO TBDP: 4.0000 mg | ORAL_TABLET | Freq: Three times a day (TID) | ORAL | 0 refills | Status: DC | PRN

## 2016-07-24 NOTE — ED Triage Notes (Signed)
Patient states she has had nausea and vomiting for the last four days.  States over the last 12 hours she has had constant vomiting. States she was seen at [facility] two weeks ago for a stomach virus with diarrhea, which has not resolved.  States she also fell four days ago with a positive loc, just prior to the start of the vomiting.  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

## 2016-07-24 NOTE — Discharge Instructions (Signed)
Please seek immediate medical attention if you have abdominal pain, fever, vaginal bleeding or passage of fluid.

## 2016-07-24 NOTE — Progress Notes (Signed)
Spoke with [employer]. Okay to dc fetal monitor. Says pt is feeling better and is keeping po fluids down. Will let me know when she is dc'd.

## 2016-07-24 NOTE — ED Provider Notes (Signed)
[facility] Provider Note   CSN: [id number] Arrival date & time: 07/24/16  [id number]     History   Chief Complaint Chief Complaint  Patient presents with  . Emesis    HPI Michele Armstrong is a 30 y.o. female.  30yo F at [redacted]wk gestation w/ h/o hypothyroidism, PTSD who p/w vomiting. Pt reports that 4 days ago, she was squatting down to use the toilet when her knee popped out of place which it has done previously. She fell onto hands and knees and states that she blacked out and was "in and out" for a minute because of the severity of the knee pain. She denies any head injury. Later that day she began having nausea and vomiting which has persisted. Yesterday it seemed better until it began again last night and has been worse this morning. No diarrhea. She had dysuria 1 time yesterday but none since and no hematuria. She reports some bladder "cramping." No severe abdominal pain. No vaginal bleeding or passage of fluids. No fever. Endorses some cough related to allergies. She took phenergan this morning without relief.   The history is provided by the patient.  Emesis      Past Medical History:  Diagnosis Date  . Anxiety   . Arrhythmia    tachycardia  . Depression   . Fatigue   . Headache(784.0)   . Hypothyroid   . PTSD (post-traumatic stress disorder)   . Seizures ([place])    Medication related seizure x 1    Patient Active Problem List   Diagnosis Date Noted  . Supervision of normal pregnancy 07/18/2016  . PTSD (post-traumatic stress disorder) 08/22/2011  . Depression, major, recurrent ([place]) 08/22/2011  . Hypothyroid 08/22/2011    Past Surgical History:  Procedure Laterality Date  . CHOLECYSTECTOMY    . WISDOM TOOTH EXTRACTION      OB History    Gravida Para Term Preterm AB Living   2       1 0   SAB TAB Ectopic Multiple Live Births         0         Home Medications    Prior to Admission medications   Medication Sig Start Date End Date Taking?  Authorizing Provider  Prenatal Vit-Fe Fumarate-FA (PRENATAL MULTIVITAMIN) TABS tablet Take 1 tablet by mouth daily.   Yes Historical Provider, MD  ondansetron (ZOFRAN ODT) 4 MG disintegrating tablet Take 1 tablet (4 mg total) by mouth every 8 (eight) hours as needed for nausea or vomiting. 07/24/16   Sharlett Iles, MD  promethazine (PHENERGAN) 25 MG suppository Place 1 suppository (25 mg total) rectally every 6 (six) hours as needed for refractory nausea / vomiting. 07/24/16   Sharlett Iles, MD    Family History Family History  Problem Relation Age of Onset  . Alcohol abuse Father   . Drug abuse Father   . Hypertension Father   . Hypertension Mother   . Diabetes Mother   . Leukemia Maternal Grandmother   . Breast cancer Maternal Aunt     Social History Social History  Substance Use Topics  . Smoking status: Former Smoker    Types: Cigarettes  . Smokeless tobacco: Never Used  . Alcohol use No     Allergies   Morphine and related   Review of Systems Review of Systems  Gastrointestinal: Positive for vomiting.   10 Systems reviewed and are negative for acute change except as noted in the HPI.  Physical Exam Updated Vital Signs Pulse 92   Temp 97.3 F (36.3 C) (Oral)   Resp 20   Ht 5\' 4"  (1.626 m)   Wt 190 lb (86.2 kg)   SpO2 100%   BMI 32.61 kg/m   Physical Exam  Constitutional: She is oriented to person, place, and time. She appears well-developed and well-nourished. No distress.  Awake, alert  HENT:  Head: Normocephalic and atraumatic.  Eyes: Conjunctivae and EOM are normal. Pupils are equal, round, and reactive to light.  Neck: Neck supple.  Cardiovascular: Normal rate, regular rhythm and normal heart sounds.   No murmur heard. Pulmonary/Chest: Effort normal and breath sounds normal. No respiratory distress.  Abdominal: Soft. Bowel sounds are normal. She exhibits no distension. There is no tenderness.  Uterus palpable slightly above  umbilicus  Musculoskeletal: She exhibits no edema.  Neurological: She is alert and oriented to person, place, and time. She has normal reflexes. No cranial nerve deficit. She exhibits normal muscle tone.  Fluent speech, normal finger-to-nose testing, negative pronator drift, no clonus 5/5 strength and normal sensation x all 4 extremities Normal gait  Skin: Skin is warm and dry.  Psychiatric: She has a normal mood and affect. Judgment and thought content normal.  Nursing note and vitals reviewed.    ED Treatments / Results  Labs (all labs ordered are listed, but only abnormal results are displayed) Labs Reviewed  COMPREHENSIVE METABOLIC PANEL - Abnormal; Notable for the following:       Result Value   Potassium 3.4 (*)    CO2 21 (*)    Glucose, Bld 106 (*)    Calcium 8.6 (*)    Total Protein 6.2 (*)    Albumin 2.9 (*)    ALT 13 (*)    Total Bilirubin 0.2 (*)    All other components within normal limits  CBC WITH DIFFERENTIAL/PLATELET - Abnormal; Notable for the following:    WBC 16.3 (*)    RBC 3.75 (*)    Hemoglobin 11.3 (*)    HCT 33.7 (*)    Neutro Abs 14.2 (*)    All other components within normal limits  URINALYSIS, ROUTINE W REFLEX MICROSCOPIC (NOT AT [facility])    EKG  EKG Interpretation  Date/Time:  Sunday July 24 2016 09:51:46 EDT Ventricular Rate:  79 PR Interval:    QRS Duration: 83 QT Interval:  433 QTC Calculation: 497 R Axis:   74 Text Interpretation:  Sinus rhythm Prolonged QT interval QT slightly longer than previous Confirmed by LITTLE MD, RACHEL [id number]) on 07/24/2016 9:55:46 AM Also confirmed by LITTLE MD, RACHEL [phone]), editor Rolla Plate, Joelene Millin [id number])  on 07/24/2016 10:14:10 AM       Radiology No results found.  Procedures Procedures (including critical care time)  Medications Ordered in ED Medications  ondansetron (ZOFRAN) injection 4 mg (4 mg Intravenous Given 07/24/16 0902)  sodium chloride 0.9 % bolus 1,000 mL (0 mLs Intravenous Stopped  07/24/16 1011)     Initial Impression / Assessment and Plan / ED Course  I have reviewed the triage vital signs and the nursing notes.  Pertinent labs & imaging results that were available during my care of the patient were reviewed by me and considered in my medical decision making (see chart for details).  Clinical Course   Pt at 23wk pregnancy w/ 4 days of vomiting and report of fall 4 days ago related to knee pain, no head injury at time of fall. Well appearing, ambulatory, and with normal VS  on exam. No focal abdominal tenderness. Normal neuro exam. Obtained above screening labs and gave zofran, IVF bolus.  Labs reassuring and fetal heart monitoring reassuring. Per OBGYN, no further monitoring required. Pt stated she felt much better and was able to drink juice w/ no vomiting in ED. She does have WBC 16,000 but UA normal, pt afebrile, no complaints of abd pain, and no significant respiratory sx to suggest acute infection. Leukocytosis may be related to pregnancy. I have given Rx for zofran and PR phenergan and discussed supportive care as well as f/u with OBGYN. Extensively reviewed return precautions including vaginal bleeding/discharge, abdominal pain, or fever. Patient voiced understanding and was discharged in satisfactory condition.  Final Clinical Impressions(s) / ED Diagnoses   Final diagnoses:  Nausea and vomiting in pregnancy    New Prescriptions New Prescriptions   ONDANSETRON (ZOFRAN ODT) 4 MG DISINTEGRATING TABLET    Take 1 tablet (4 mg total) by mouth every 8 (eight) hours as needed for nausea or vomiting.   PROMETHAZINE (PHENERGAN) 25 MG SUPPOSITORY    Place 1 suppository (25 mg total) rectally every 6 (six) hours as needed for refractory nausea / vomiting.     Sharlett Iles, MD 07/24/16 1055

## 2016-07-24 NOTE — Progress Notes (Signed)
Spoke with Dr. Dawna Part. Dr. Glo Herring wants pt to be dc'd with zofran or phenergan.

## 2016-07-24 NOTE — Progress Notes (Signed)
Received from [employer]. Pt is 23 4/[redacted] weeks gestation in with N&V X 4 days. Pt says she has been vomiting for the past 12 hours. No vaginal bleeding or leaking of flluid. Says she is complaining of urinary symptoms and a urine has been sent.

## 2016-07-24 NOTE — Progress Notes (Signed)
Spoke with Dr. Glo Herring. Okay to dc fetal monitor. Will need to send pt home with antiemetics.

## 2016-07-24 NOTE — Progress Notes (Signed)
Sopke with [employer]. Okay to dc cardio and leave the toco on to monitor uc's. Says she can't d/c the cardio.

## 2016-08-15 ENCOUNTER — Encounter: Payer: Self-pay | Admitting: Obstetrics & Gynecology

## 2016-08-15 ENCOUNTER — Ambulatory Visit (INDEPENDENT_AMBULATORY_CARE_PROVIDER_SITE_OTHER): Payer: Medicaid Other | Admitting: Obstetrics & Gynecology

## 2016-08-15 VITALS — BP 115/77 | HR 97 | Wt 193.0 lb

## 2016-08-15 DIAGNOSIS — Z23 Encounter for immunization: Secondary | ICD-10-CM

## 2016-08-15 DIAGNOSIS — Z3483 Encounter for supervision of other normal pregnancy, third trimester: Secondary | ICD-10-CM

## 2016-08-15 DIAGNOSIS — E038 Other specified hypothyroidism: Secondary | ICD-10-CM

## 2016-08-15 DIAGNOSIS — O99283 Endocrine, nutritional and metabolic diseases complicating pregnancy, third trimester: Secondary | ICD-10-CM

## 2016-08-15 MED ORDER — PROMETHAZINE HCL 25 MG RE SUPP: 25.0000 mg | Freq: Four times a day (QID) | RECTAL | 0 refills | Status: DC | PRN

## 2016-08-16 NOTE — Progress Notes (Signed)
   PRENATAL VISIT NOTE  Subjective:  Michele Armstrong is a 30 y.o. G2P0010 at [redacted]w[redacted]d being seen today for ongoing prenatal care.  She is currently monitored for the following issues for this high-risk pregnancy and has PTSD (post-traumatic stress disorder); Depression, major, recurrent ([place]); Hypothyroid; and Supervision of normal pregnancy on her problem list.  Patient reports back pain, pain from large breasts.  Contractions: Irritability. Vag. Bleeding: None.  Movement: Present. Denies leaking of fluid.   The following portions of the patient's history were reviewed and updated as appropriate: allergies, current medications, past family history, past medical history, past social history, past surgical history and problem list. Problem list updated.  Objective:   Vitals:   08/15/16 1409  BP: 115/77  Pulse: 97  Weight: 193 lb (87.5 kg)    Fetal Status: Fetal Heart Rate (bpm): 147 Fundal Height: 26 cm Movement: Present     General:  Alert, oriented and cooperative. Patient is in no acute distress.  Skin: Skin is warm and dry. No rash noted.   Cardiovascular: Normal heart rate noted  Respiratory: Normal respiratory effort, no problems with respiration noted  Abdomen: Soft, gravid, appropriate for gestational age. Pain/Pressure: Present     Pelvic:  Cervical exam deferred        Extremities: Normal range of motion.  Edema: None  Mental Status: Normal mood and affect. Normal behavior. Normal judgment and thought content.   Assessment and Plan:  Pregnancy: G2P0010 at [redacted]w[redacted]d  1. Encounter for supervision of other normal pregnancy in third trimester -Pt wants to transfer to [facility] office for better convenience -Pregnancy belt helping pains.  2. Other specified hypothyroidism TSH next visit at time of GCT  3. Needs flu shot - Flu Vaccine QUAD 36+ mos IM (Fluarix, Quad PF)  Preterm labor symptoms and general obstetric precautions including but not limited to vaginal bleeding,  contractions, leaking of fluid and fetal movement were reviewed in detail with the patient. Please refer to After Visit Summary for other counseling recommendations.  Return in about 3 weeks (around 09/05/2016).   Guss Bunde, MD

## 2016-09-01 ENCOUNTER — Encounter: Payer: Self-pay | Admitting: Family Medicine

## 2016-09-07 ENCOUNTER — Ambulatory Visit (INDEPENDENT_AMBULATORY_CARE_PROVIDER_SITE_OTHER): Payer: Medicaid Other | Admitting: Family Medicine

## 2016-09-07 VITALS — BP 100/63 | HR 93 | Wt 202.0 lb

## 2016-09-07 DIAGNOSIS — Z3490 Encounter for supervision of normal pregnancy, unspecified, unspecified trimester: Secondary | ICD-10-CM

## 2016-09-07 DIAGNOSIS — E038 Other specified hypothyroidism: Secondary | ICD-10-CM

## 2016-09-07 DIAGNOSIS — O99283 Endocrine, nutritional and metabolic diseases complicating pregnancy, third trimester: Secondary | ICD-10-CM

## 2016-09-07 NOTE — Addendum Note (Signed)
Addended by: Phill Myron on: 09/07/2016 02:05 PM   Modules accepted: Orders

## 2016-09-07 NOTE — Progress Notes (Signed)
   PRENATAL VISIT NOTE  Subjective:  Michele Armstrong is a 30 y.o. G2P0010 at [redacted]w[redacted]d being seen today for ongoing prenatal care.  She is currently monitored for the following issues for this low-risk pregnancy and has PTSD (post-traumatic stress disorder); Depression, major, recurrent ([place]); Hypothyroid; and Supervision of normal pregnancy on her problem list.  Patient reports no complaints.  Contractions: Irritability. Vag. Bleeding: None.  Movement: Present. Denies leaking of fluid.   The following portions of the patient's history were reviewed and updated as appropriate: allergies, current medications, past family history, past medical history, past social history, past surgical history and problem list. Problem list updated.  Objective:   Vitals:   09/07/16 1332  BP: 100/63  Pulse: 93  Weight: 202 lb (91.6 kg)    Fetal Status: Fetal Heart Rate (bpm): 143   Movement: Present     General:  Alert, oriented and cooperative. Patient is in no acute distress.  Skin: Skin is warm and dry. No rash noted.   Cardiovascular: Normal heart rate noted  Respiratory: Normal respiratory effort, no problems with respiration noted  Abdomen: Soft, gravid, appropriate for gestational age. Pain/Pressure: Present     Pelvic:  Cervical exam deferred        Extremities: Normal range of motion.  Edema: None  Mental Status: Normal mood and affect. Normal behavior. Normal judgment and thought content.   Assessment and Plan:  Pregnancy: G2P0010 at [redacted]w[redacted]d  1. Prenatal care, antepartum FHT and FH normal - Glucose Tolerance, 1 HR (50g) - CBC - HIV antibody (with reflex) - TSH - RPR  2. Other specified hypothyroidism TSH at beginning of pregnancy normal. Will repeat.   Preterm labor symptoms and general obstetric precautions including but not limited to vaginal bleeding, contractions, leaking of fluid and fetal movement were reviewed in detail with the patient. Please refer to After Visit Summary for  other counseling recommendations.  Return in about 2 weeks (around 09/21/2016).   Truett Mainland, DO

## 2016-09-07 NOTE — Progress Notes (Signed)
Patient states unable to stay for gtt today but will come back to do it later this week. Kathrene Alu RNBSN

## 2016-09-12 ENCOUNTER — Other Ambulatory Visit (INDEPENDENT_AMBULATORY_CARE_PROVIDER_SITE_OTHER): Payer: Medicaid Other

## 2016-09-12 DIAGNOSIS — Z3493 Encounter for supervision of normal pregnancy, unspecified, third trimester: Secondary | ICD-10-CM | POA: Diagnosis not present

## 2016-09-12 DIAGNOSIS — R7309 Other abnormal glucose: Secondary | ICD-10-CM

## 2016-09-12 DIAGNOSIS — Z3490 Encounter for supervision of normal pregnancy, unspecified, unspecified trimester: Secondary | ICD-10-CM

## 2016-09-12 DIAGNOSIS — Z23 Encounter for immunization: Secondary | ICD-10-CM | POA: Diagnosis not present

## 2016-09-12 NOTE — Progress Notes (Signed)
Patient here for 28 week labs. Kathrene Alu RNBSN

## 2016-09-13 LAB — OBSTETRIC PANEL
Antibody Screen: NEGATIVE
Basophils Absolute: 0 cells/uL (ref 0–200)
Basophils Relative: 0 %
EOS ABS: 125 {cells}/uL (ref 15–500)
Eosinophils Relative: 1 %
HCT: 35.2 % (ref 35.0–45.0)
HEP B S AG: NEGATIVE
Hemoglobin: 11.2 g/dL — ABNORMAL LOW (ref 11.7–15.5)
LYMPHS ABS: 2250 {cells}/uL (ref 850–3900)
Lymphocytes Relative: 18 %
MCH: 28.6 pg (ref 27.0–33.0)
MCHC: 31.8 g/dL — ABNORMAL LOW (ref 32.0–36.0)
MCV: 90 fL (ref 80.0–100.0)
MPV: 9.5 fL (ref 7.5–12.5)
Monocytes Absolute: 625 cells/uL (ref 200–950)
Monocytes Relative: 5 %
Neutro Abs: 9500 cells/uL — ABNORMAL HIGH (ref 1500–7800)
Neutrophils Relative %: 76 %
PLATELETS: 263 10*3/uL (ref 140–400)
RBC: 3.91 MIL/uL (ref 3.80–5.10)
RDW: 14.8 % (ref 11.0–15.0)
RH TYPE: POSITIVE
RUBELLA: 1.02 {index} — AB (ref <0.90)
WBC: 12.5 10*3/uL — ABNORMAL HIGH (ref 3.8–10.8)

## 2016-09-13 LAB — GLUCOSE TOLERANCE, 1 HOUR (50G) W/O FASTING: Glucose, 1 Hr, gestational: 181 mg/dL — ABNORMAL HIGH (ref <140)

## 2016-09-13 LAB — TSH: TSH: 7.41 mIU/L — ABNORMAL HIGH

## 2016-09-13 LAB — HIV ANTIBODY (ROUTINE TESTING W REFLEX): HIV 1&2 Ab, 4th Generation: NONREACTIVE

## 2016-09-14 ENCOUNTER — Telehealth: Payer: Self-pay

## 2016-09-14 NOTE — Addendum Note (Signed)
Addended by: Phill Myron on: 09/14/2016 11:21 AM   Modules accepted: Orders

## 2016-09-14 NOTE — Telephone Encounter (Signed)
Patient called and informed of elevated one hour gtt. Patient instructed to go to [facility] lab on Friday for 3 hr gtt. Orders placed. Patient made aware that she will need to be fasting the night before after midnight. Patient states understanding. Kathrene Alu RNBSN

## 2016-09-15 NOTE — Addendum Note (Signed)
Addended by: Phill Myron on: 09/15/2016 08:30 AM   Modules accepted: Orders

## 2016-09-16 LAB — T3, FREE: T3, Free: 2.4 pg/mL (ref 2.3–4.2)

## 2016-09-16 LAB — T4, FREE: FREE T4: 0.7 ng/dL — AB (ref 0.8–1.8)

## 2016-09-17 LAB — GLUCOSE TOLERANCE, 3 HOURS
GLUCOSE 3 HOUR GTT: 134 mg/dL (ref <145)
GLUCOSE, 1 HOUR-GESTATIONAL: 173 mg/dL (ref <190)
Glucose Tolerance, 2 hour: 132 mg/dL (ref <165)
Glucose Tolerance, Fasting: 79 mg/dL (ref 65–104)

## 2016-09-19 ENCOUNTER — Other Ambulatory Visit: Payer: Self-pay | Admitting: Family Medicine

## 2016-09-19 ENCOUNTER — Encounter: Payer: Self-pay | Admitting: Family Medicine

## 2016-09-19 MED ORDER — LEVOTHYROXINE SODIUM 25 MCG PO TABS: 25.0000 ug | ORAL_TABLET | Freq: Every day | ORAL | 3 refills | Status: DC

## 2016-09-21 ENCOUNTER — Ambulatory Visit (INDEPENDENT_AMBULATORY_CARE_PROVIDER_SITE_OTHER): Payer: Medicaid Other | Admitting: Family Medicine

## 2016-09-21 VITALS — BP 100/63 | HR 89 | Wt 209.0 lb

## 2016-09-21 DIAGNOSIS — Z3483 Encounter for supervision of other normal pregnancy, third trimester: Secondary | ICD-10-CM

## 2016-09-21 DIAGNOSIS — E038 Other specified hypothyroidism: Secondary | ICD-10-CM

## 2016-09-21 MED ORDER — PROMETHAZINE HCL 25 MG RE SUPP: 25.0000 mg | Freq: Four times a day (QID) | RECTAL | 0 refills | Status: DC | PRN

## 2016-09-21 MED ORDER — LEVOTHYROXINE SODIUM 25 MCG PO TABS: 25.0000 ug | ORAL_TABLET | Freq: Every day | ORAL | 3 refills | Status: DC

## 2016-09-21 MED ORDER — HYDROCORTISONE ACETATE 25 MG RE SUPP: 25.0000 mg | Freq: Two times a day (BID) | RECTAL | 1 refills | Status: DC

## 2016-09-21 NOTE — Addendum Note (Signed)
Addended by: Truett Mainland on: 09/21/2016 01:57 PM   Modules accepted: Orders

## 2016-09-21 NOTE — Progress Notes (Signed)
   PRENATAL VISIT NOTE  Subjective:  Michele Armstrong is a 30 y.o. G2P0010 at [redacted]w[redacted]d being seen today for ongoing prenatal care.  She is currently monitored for the following issues for this low-risk pregnancy and has PTSD (post-traumatic stress disorder); Depression, major, recurrent ([place]); Hypothyroid; and Supervision of normal pregnancy on her problem list.  Patient reports hemorrhoids. Not improved with wipes.  Contractions: Irritability. Vag. Bleeding: None.  Movement: Present. Denies leaking of fluid.   The following portions of the patient's history were reviewed and updated as appropriate: allergies, current medications, past family history, past medical history, past social history, past surgical history and problem list. Problem list updated.  Objective:   Vitals:   09/21/16 1341  BP: 100/63  Pulse: 89  Weight: 209 lb (94.8 kg)    Fetal Status: Fetal Heart Rate (bpm): 155 Fundal Height: 33 cm Movement: Present     General:  Alert, oriented and cooperative. Patient is in no acute distress.  Skin: Skin is warm and dry. No rash noted.   Cardiovascular: Normal heart rate noted  Respiratory: Normal respiratory effort, no problems with respiration noted  Abdomen: Soft, gravid, appropriate for gestational age. Pain/Pressure: Present     Pelvic:  Cervical exam deferred        Extremities: Normal range of motion.  Edema: None  Mental Status: Normal mood and affect. Normal behavior. Normal judgment and thought content.   Assessment and Plan:  Pregnancy: G2P0010 at [redacted]w[redacted]d  1. Encounter for supervision of other normal pregnancy in third trimester FHT and FH normal  2. Other specified hypothyroidism Pt to pick up synthroid  Preterm labor symptoms and general obstetric precautions including but not limited to vaginal bleeding, contractions, leaking of fluid and fetal movement were reviewed in detail with the patient. Please refer to After Visit Summary for other counseling  recommendations.  Return in about 2 weeks (around 10/05/2016).   Truett Mainland, DO

## 2016-10-03 HISTORY — PX: HEMORRHOID BANDING: SHX5850

## 2016-10-03 NOTE — L&D Delivery Note (Signed)
Delivery Note Pt pushed for less than 30 mins and at 3:24 AM a viable female was delivered via Vaginal, Spontaneous Delivery (Presentation: LOA  ).  APGAR: 9, 9; weight: pending. Infant dried and lifted to pt's abd; cord clamped and cut by FOB. Hospital cord blood sample collected. Placenta status: spont , intact .  Cord: 3 vessel  Anesthesia:  Epidural Episiotomy: None Lacerations: 1st degree;Vaginal Suture Repair: 3.0 monocryl Est. Blood Loss (mL): 150  Mom to postpartum.  Baby to Couplet care / Skin to Skin.  Serita Grammes CNM 11/24/2016, 3:40 AM

## 2016-10-07 ENCOUNTER — Ambulatory Visit (INDEPENDENT_AMBULATORY_CARE_PROVIDER_SITE_OTHER): Payer: Medicaid Other | Admitting: Obstetrics and Gynecology

## 2016-10-07 VITALS — BP 108/76 | HR 109 | Wt 213.0 lb

## 2016-10-07 DIAGNOSIS — E038 Other specified hypothyroidism: Secondary | ICD-10-CM

## 2016-10-07 DIAGNOSIS — O99619 Diseases of the digestive system complicating pregnancy, unspecified trimester: Secondary | ICD-10-CM

## 2016-10-07 DIAGNOSIS — O99613 Diseases of the digestive system complicating pregnancy, third trimester: Secondary | ICD-10-CM

## 2016-10-07 DIAGNOSIS — K5900 Constipation, unspecified: Secondary | ICD-10-CM | POA: Insufficient documentation

## 2016-10-07 DIAGNOSIS — Z3483 Encounter for supervision of other normal pregnancy, third trimester: Secondary | ICD-10-CM

## 2016-10-07 NOTE — Progress Notes (Signed)
Subjective:  Michele Armstrong is a 31 y.o. G2P0010 at [redacted]w[redacted]d being seen today for ongoing prenatal care.  She is currently monitored for the following issues for this low-risk pregnancy and has PTSD (post-traumatic stress disorder); Depression, major, recurrent ([place]); Hypothyroid; Supervision of normal pregnancy; and Constipation during pregnancy on her problem list.  Patient reports constipation.  Contractions: Irritability. Vag. Bleeding: None.  Movement: Present. Denies leaking of fluid.   The following portions of the patient's history were reviewed and updated as appropriate: allergies, current medications, past family history, past medical history, past social history, past surgical history and problem list. Problem list updated.  Objective:   Vitals:   10/07/16 0820  BP: 108/76  Pulse: (!) 109  Weight: 213 lb (96.6 kg)    Fetal Status: Fetal Heart Rate (bpm): 165   Movement: Present     General:  Alert, oriented and cooperative. Patient is in no acute distress.  Skin: Skin is warm and dry. No rash noted.   Cardiovascular: Normal heart rate noted  Respiratory: Normal respiratory effort, no problems with respiration noted  Abdomen: Soft, gravid, appropriate for gestational age. Pain/Pressure: Absent     Pelvic:  Cervical exam deferred        Extremities: Normal range of motion.  Edema: None  Mental Status: Normal mood and affect. Normal behavior. Normal judgment and thought content.   Urinalysis: Urine Protein: Negative Urine Glucose: Negative  Assessment and Plan:  Pregnancy: G2P0010 at [redacted]w[redacted]d  1. Encounter for supervision of other normal pregnancy in third trimester Stable  2. Other specified hypothyroidism Continue with Synthyoid  3. Constipation during pregnancy in third trimester Tx reviewed with pt.  Preterm labor symptoms and general obstetric precautions including but not limited to vaginal bleeding, contractions, leaking of fluid and fetal movement were reviewed in  detail with the patient. Please refer to After Visit Summary for other counseling recommendations.  Return in about 2 weeks (around 10/21/2016) for OB visit.   Chancy Milroy, MD

## 2016-10-07 NOTE — Patient Instructions (Signed)
Third Trimester of Pregnancy The third trimester is from week 29 through week 40 (months 7 through 9). The third trimester is a time when the unborn baby (fetus) is growing rapidly. At the end of the ninth month, the fetus is about 20 inches in length and weighs 6-10 pounds. Body changes during your third trimester Your body goes through many changes during pregnancy. The changes vary from woman to woman. During the third trimester:  Your weight will continue to increase. You can expect to gain 25-35 pounds (11-16 kg) by the end of the pregnancy.  You may begin to get stretch marks on your hips, abdomen, and breasts.  You may urinate more often because the fetus is moving lower into your pelvis and pressing on your bladder.  You may develop or continue to have heartburn. This is caused by increased hormones that slow down muscles in the digestive tract.  You may develop or continue to have constipation because increased hormones slow digestion and cause the muscles that push waste through your intestines to relax.  You may develop hemorrhoids. These are swollen veins (varicose veins) in the rectum that can itch or be painful.  You may develop swollen, bulging veins (varicose veins) in your legs.  You may have increased body aches in the pelvis, back, or thighs. This is due to weight gain and increased hormones that are relaxing your joints.  You may have changes in your hair. These can include thickening of your hair, rapid growth, and changes in texture. Some women also have hair loss during or after pregnancy, or hair that feels dry or thin. Your hair will most likely return to normal after your baby is born.  Your breasts will continue to grow and they will continue to become tender. A yellow fluid (colostrum) may leak from your breasts. This is the first milk you are producing for your baby.  Your belly button may stick out.  You may notice more swelling in your hands, face, or  ankles.  You may have increased tingling or numbness in your hands, arms, and legs. The skin on your belly may also feel numb.  You may feel short of breath because of your expanding uterus.  You may have more problems sleeping. This can be caused by the size of your belly, increased need to urinate, and an increase in your body's metabolism.  You may notice the fetus "dropping," or moving lower in your abdomen.  You may have increased vaginal discharge.  Your cervix becomes thin and soft (effaced) near your due date. What to expect at prenatal visits You will have prenatal exams every 2 weeks until week 36. Then you will have weekly prenatal exams. During a routine prenatal visit:  You will be weighed to make sure you and the fetus are growing normally.  Your blood pressure will be taken.  Your abdomen will be measured to track your baby's growth.  The fetal heartbeat will be listened to.  Any test results from the previous visit will be discussed.  You may have a cervical check near your due date to see if you have effaced. At around 36 weeks, your health care provider will check your cervix. At the same time, your health care provider will also perform a test on the secretions of the vaginal tissue. This test is to determine if a type of bacteria, Group B streptococcus, is present. Your health care provider will explain this further. Your health care provider may ask you:    What your birth plan is.  How you are feeling.  If you are feeling the baby move.  If you have had any abnormal symptoms, such as leaking fluid, bleeding, severe headaches, or abdominal cramping.  If you are using any tobacco products, including cigarettes, chewing tobacco, and electronic cigarettes.  If you have any questions. Other tests or screenings that may be performed during your third trimester include:  Blood tests that check for low iron levels (anemia).  Fetal testing to check the health,  activity level, and growth of the fetus. Testing is done if you have certain medical conditions or if there are problems during the pregnancy.  Nonstress test (NST). This test checks the health of your baby to make sure there are no signs of problems, such as the baby not getting enough oxygen. During this test, a belt is placed around your belly. The baby is made to move, and its heart rate is monitored during movement. What is false labor? False labor is a condition in which you feel small, irregular tightenings of the muscles in the womb (contractions) that eventually go away. These are called Braxton Hicks contractions. Contractions may last for hours, days, or even weeks before true labor sets in. If contractions come at regular intervals, become more frequent, increase in intensity, or become painful, you should see your health care provider. What are the signs of labor?  Abdominal cramps.  Regular contractions that start at 10 minutes apart and become stronger and more frequent with time.  Contractions that start on the top of the uterus and spread down to the lower abdomen and back.  Increased pelvic pressure and dull back pain.  A watery or bloody mucus discharge that comes from the vagina.  Leaking of amniotic fluid. This is also known as your "water breaking." It could be a slow trickle or a gush. Let your doctor know if it has a color or strange odor. If you have any of these signs, call your health care provider right away, even if it is before your due date. Follow these instructions at home: Eating and drinking  Continue to eat regular, healthy meals.  Do not eat:  Raw meat or meat spreads.  Unpasteurized milk or cheese.  Unpasteurized juice.  Store-made salad.  Refrigerated smoked seafood.  Hot dogs or deli meat, unless they are piping hot.  More than 6 ounces of albacore tuna a week.  Shark, swordfish, king mackerel, or tile fish.  Store-made salads.  Raw  sprouts, such as mung bean or alfalfa sprouts.  Take prenatal vitamins as told by your health care provider.  Take 1000 mg of calcium daily as told by your health care provider.  If you develop constipation:  Take over-the-counter or prescription medicines.  Drink enough fluid to keep your urine clear or pale yellow.  Eat foods that are high in fiber, such as fresh fruits and vegetables, whole grains, and beans.  Limit foods that are high in fat and processed sugars, such as fried and sweet foods. Activity  Exercise only as directed by your health care provider. Healthy pregnant women should aim for 2 hours and 30 minutes of moderate exercise per week. If you experience any pain or discomfort while exercising, stop.  Avoid heavy lifting.  Do not exercise in extreme heat or humidity, or at high altitudes.  Wear low-heel, comfortable shoes.  Practice good posture.  Do not travel far distances unless it is absolutely necessary and only with the approval   of your health care provider.  Wear your seat belt at all times while in a car, on a bus, or on a plane.  Take frequent breaks and rest with your legs elevated if you have leg cramps or low back pain.  Do not use hot tubs, steam rooms, or saunas.  You may continue to have sex unless your health care provider tells you otherwise. Lifestyle  Do not use any products that contain nicotine or tobacco, such as cigarettes and e-cigarettes. If you need help quitting, ask your health care provider.  Do not drink alcohol.  Do not use any medicinal herbs or unprescribed drugs. These chemicals affect the formation and growth of the baby.  If you develop varicose veins:  Wear support pantyhose or compression stockings as told by your healthcare provider.  Elevate your feet for 15 minutes, 3-4 times a day.  Wear a supportive maternity bra to help with breast tenderness. General instructions  Take over-the-counter and prescription  medicines only as told by your health care provider. There are medicines that are either safe or unsafe to take during pregnancy.  Take warm sitz baths to soothe any pain or discomfort caused by hemorrhoids. Use hemorrhoid cream or witch hazel if your health care provider approves.  Avoid cat litter boxes and soil used by cats. These carry germs that can cause birth defects in the baby. If you have a cat, ask someone to clean the litter box for you.  To prepare for the arrival of your baby:  Take prenatal classes to understand, practice, and ask questions about the labor and delivery.  Make a trial run to the hospital.  Visit the hospital and tour the maternity area.  Arrange for maternity or paternity leave through employers.  Arrange for family and friends to take care of pets while you are in the hospital.  Purchase a rear-facing car seat and make sure you know how to install it in your car.  Pack your hospital bag.  Prepare the baby's nursery. Make sure to remove all pillows and stuffed animals from the baby's crib to prevent suffocation.  Visit your dentist if you have not gone during your pregnancy. Use a soft toothbrush to brush your teeth and be gentle when you floss.  Keep all prenatal follow-up visits as told by your health care provider. This is important. Contact a health care provider if:  You are unsure if you are in labor or if your water has broken.  You become dizzy.  You have mild pelvic cramps, pelvic pressure, or nagging pain in your abdominal area.  You have lower back pain.  You have persistent nausea, vomiting, or diarrhea.  You have an unusual or bad smelling vaginal discharge.  You have pain when you urinate. Get help right away if:  You have a fever.  You are leaking fluid from your vagina.  You have spotting or bleeding from your vagina.  You have severe abdominal pain or cramping.  You have rapid weight loss or weight gain.  You have  shortness of breath with chest pain.  You notice sudden or extreme swelling of your face, hands, ankles, feet, or legs.  Your baby makes fewer than 10 movements in 2 hours.  You have severe headaches that do not go away with medicine.  You have vision changes. Summary  The third trimester is from week 29 through week 40, months 7 through 9. The third trimester is a time when the unborn baby (fetus)   is growing rapidly.  During the third trimester, your discomfort may increase as you and your baby continue to gain weight. You may have abdominal, leg, and back pain, sleeping problems, and an increased need to urinate.  During the third trimester your breasts will keep growing and they will continue to become tender. A yellow fluid (colostrum) may leak from your breasts. This is the first milk you are producing for your baby.  False labor is a condition in which you feel small, irregular tightenings of the muscles in the womb (contractions) that eventually go away. These are called Braxton Hicks contractions. Contractions may last for hours, days, or even weeks before true labor sets in.  Signs of labor can include: abdominal cramps; regular contractions that start at 10 minutes apart and become stronger and more frequent with time; watery or bloody mucus discharge that comes from the vagina; increased pelvic pressure and dull back pain; and leaking of amniotic fluid. This information is not intended to replace advice given to you by your health care provider. Make sure you discuss any questions you have with your health care provider. Document Released: 09/13/2001 Document Revised: 02/25/2016 Document Reviewed: 11/20/2012 Elsevier Interactive Patient Education  2017 [employer]  

## 2016-10-12 ENCOUNTER — Telehealth: Payer: Self-pay

## 2016-10-12 MED ORDER — HYDROCORTISONE ACETATE 25 MG RE SUPP: 25.0000 mg | Freq: Two times a day (BID) | RECTAL | 1 refills | Status: DC

## 2016-10-12 NOTE — Telephone Encounter (Signed)
Patient would like refill on suppository for hemorrhoids. Pharmacy confirmed and rx refill sent.   Patient also states she will be traveling four hours to [place] for a baby shower- encourage patient to take breaks frequently (every hour) and get out of car and stretch.patient also made aware that she should know locations of nearby hospitals since she is 35 weeks. Kathrene Alu RN BSN

## 2016-10-15 ENCOUNTER — Encounter (HOSPITAL_COMMUNITY): Payer: Self-pay | Admitting: *Deleted

## 2016-10-15 ENCOUNTER — Inpatient Hospital Stay (HOSPITAL_COMMUNITY)
Admission: AD | Admit: 2016-10-15 | Discharge: 2016-10-15 | Disposition: A | Discharge status: Home / Self Care | Payer: Medicaid Other | Source: Ambulatory Visit | Attending: Family Medicine | Admitting: Family Medicine

## 2016-10-15 DIAGNOSIS — O26893 Other specified pregnancy related conditions, third trimester: Secondary | ICD-10-CM | POA: Insufficient documentation

## 2016-10-15 DIAGNOSIS — E039 Hypothyroidism, unspecified: Secondary | ICD-10-CM | POA: Insufficient documentation

## 2016-10-15 DIAGNOSIS — O99283 Endocrine, nutritional and metabolic diseases complicating pregnancy, third trimester: Secondary | ICD-10-CM | POA: Diagnosis not present

## 2016-10-15 DIAGNOSIS — R102 Pelvic and perineal pain: Secondary | ICD-10-CM | POA: Diagnosis not present

## 2016-10-15 DIAGNOSIS — O99891 Other specified diseases and conditions complicating pregnancy: Secondary | ICD-10-CM

## 2016-10-15 DIAGNOSIS — Z3A35 35 weeks gestation of pregnancy: Secondary | ICD-10-CM

## 2016-10-15 DIAGNOSIS — Z3689 Encounter for other specified antenatal screening: Secondary | ICD-10-CM

## 2016-10-15 DIAGNOSIS — O9989 Other specified diseases and conditions complicating pregnancy, childbirth and the puerperium: Secondary | ICD-10-CM

## 2016-10-15 DIAGNOSIS — Z87891 Personal history of nicotine dependence: Secondary | ICD-10-CM | POA: Insufficient documentation

## 2016-10-15 DIAGNOSIS — N949 Unspecified condition associated with female genital organs and menstrual cycle: Secondary | ICD-10-CM

## 2016-10-15 DIAGNOSIS — M549 Dorsalgia, unspecified: Secondary | ICD-10-CM

## 2016-10-15 LAB — URINALYSIS, ROUTINE W REFLEX MICROSCOPIC
Bilirubin Urine: NEGATIVE
GLUCOSE, UA: NEGATIVE mg/dL
HGB URINE DIPSTICK: NEGATIVE
KETONES UR: NEGATIVE mg/dL
Leukocytes, UA: NEGATIVE
Nitrite: NEGATIVE
PROTEIN: NEGATIVE mg/dL
Specific Gravity, Urine: 1.011 (ref 1.005–1.030)
pH: 9 — ABNORMAL HIGH (ref 5.0–8.0)

## 2016-10-15 NOTE — MAU Note (Signed)
Patient presents to mau with c/o lower back and abdominal pain that she says is more cramping in nature, comes and goes. Also endorses lots of pelvic pressure and says that she had some "bloody show" 2 days ago when all this started. Endorses being at a labor class today and saw the preterm labor signs; proceeded to call her doctor and they told her to come in for evaluation. Denies LOF at this time. +FM.

## 2016-10-15 NOTE — Discharge Instructions (Signed)
Back Pain in Pregnancy Introduction Back pain during pregnancy is common. Back pain may be caused by several factors that are related to changes during your pregnancy. Follow these instructions at home: Managing pain, stiffness, and swelling  If directed, apply ice for sudden (acute) back pain.  Put ice in a plastic bag.  Place a towel between your skin and the bag.  Leave the ice on for 20 minutes, 2-3 times per day.  If directed, apply heat to the affected area before you exercise:  Place a towel between your skin and the heat pack or heating pad.  Leave the heat on for 20-30 minutes.  Remove the heat if your skin turns bright red. This is especially important if you are unable to feel pain, heat, or cold. You may have a greater risk of getting burned. Activity  Exercise as told by your health care provider. Exercising is the best way to prevent or manage back pain.  Listen to your body when lifting. If lifting hurts, ask for help or bend your knees. This uses your leg muscles instead of your back muscles.  Squat down when picking up something from the floor. Do not bend over.  Only use bed rest as told by your health care provider. Bed rest should only be used for the most severe episodes of back pain. Standing, Sitting, and Lying Down  Do not stand in one place for long periods of time.  Use good posture when sitting. Make sure your head rests over your shoulders and is not hanging forward. Use a pillow on your lower back if necessary.  Try sleeping on your side, preferably the left side, with a pillow or two between your legs. If you are sore after a night's rest, your bed may be too soft. A firm mattress may provide more support for your back during pregnancy. General instructions  Do not wear high heels.  Eat a healthy diet. Try to gain weight within your health care provider's recommendations.  Use a maternity girdle, elastic sling, or back brace as told by your  health care provider.  Take over-the-counter and prescription medicines only as told by your health care provider.  Keep all follow-up visits as told by your health care provider. This is important. This includes any visits with any specialists, such as a physical therapist. Contact a health care provider if:  Your back pain interferes with your daily activities.  You have increasing pain in other parts of your body. Get help right away if:  You develop numbness, tingling, weakness, or problems with the use of your arms or legs.  You develop severe back pain that is not controlled with medicine.  You have a sudden change in bowel or bladder control.  You develop shortness of breath, dizziness, or you faint.  You develop nausea, vomiting, or sweating.  You have back pain that is a rhythmic, cramping pain similar to labor pains. Labor pain is usually 1-2 minutes apart, lasts for about 1 minute, and involves a bearing down feeling or pressure in your pelvis.  You have back pain and your water breaks or you have vaginal bleeding.  You have back pain or numbness that travels down your leg.  Your back pain developed after you fell.  You develop pain on one side of your back.  You see blood in your urine.  You develop skin blisters in the area of your back pain. This information is not intended to replace advice given to  you by your health care provider. Make sure you discuss any questions you have with your health care provider. Document Released: 12/28/2005 Document Revised: 02/25/2016 Document Reviewed: 06/03/2015  2017 Elsevier Braxton Hicks Contractions Contractions of the uterus can occur throughout pregnancy. Contractions are not always a sign that you are in labor.  WHAT ARE BRAXTON HICKS CONTRACTIONS?  Contractions that occur before labor are called Braxton Hicks contractions, or false labor. Toward the end of pregnancy (32-34 weeks), these contractions can develop more  often and may become more forceful. This is not true labor because these contractions do not result in opening (dilatation) and thinning of the cervix. They are sometimes difficult to tell apart from true labor because these contractions can be forceful and people have different pain tolerances. You should not feel embarrassed if you go to the hospital with false labor. Sometimes, the only way to tell if you are in true labor is for your health care provider to look for changes in the cervix. If there are no prenatal problems or other health problems associated with the pregnancy, it is completely safe to be sent home with false labor and await the onset of true labor. HOW CAN YOU TELL THE DIFFERENCE BETWEEN TRUE AND FALSE LABOR? False Labor   The contractions of false labor are usually shorter and not as hard as those of true labor.   The contractions are usually irregular.   The contractions are often felt in the front of the lower abdomen and in the groin.   The contractions may go away when you walk around or change positions while lying down.   The contractions get weaker and are shorter lasting as time goes on.   The contractions do not usually become progressively stronger, regular, and closer together as with true labor.  True Labor   Contractions in true labor last 30-70 seconds, become very regular, usually become more intense, and increase in frequency.   The contractions do not go away with walking.   The discomfort is usually felt in the top of the uterus and spreads to the lower abdomen and low back.   True labor can be determined by your health care provider with an exam. This will show that the cervix is dilating and getting thinner.  WHAT TO REMEMBER  Keep up with your usual exercises and follow other instructions given by your health care provider.   Take medicines as directed by your health care provider.   Keep your regular prenatal appointments.    Eat and drink lightly if you think you are going into labor.   If Braxton Hicks contractions are making you uncomfortable:   Change your position from lying down or resting to walking, or from walking to resting.   Sit and rest in a tub of warm water.   Drink 2-3 glasses of water. Dehydration may cause these contractions.   Do slow and deep breathing several times an hour.  WHEN SHOULD I SEEK IMMEDIATE MEDICAL CARE? Seek immediate medical care if:  Your contractions become stronger, more regular, and closer together.   You have fluid leaking or gushing from your vagina.   You have a fever.   You pass blood-tinged mucus.   You have vaginal bleeding.   You have continuous abdominal pain.   You have low back pain that you never had before.   You feel your baby's head pushing down and causing pelvic pressure.   Your baby is not moving as much as  it used to.  This information is not intended to replace advice given to you by your health care provider. Make sure you discuss any questions you have with your health care provider. Document Released: 09/19/2005 Document Revised: 01/11/2016 Document Reviewed: 07/01/2013 Elsevier Interactive Patient Education  2017 [employer].

## 2016-10-15 NOTE — MAU Provider Note (Signed)
History     CSN: [id number]  Arrival date and time: 10/15/16 1034   First Provider Initiated Contact with Patient 10/15/16 1109      Chief Complaint  Patient presents with  . Labor Eval   G2P0010 @35 .3 weeks here with back pain and pelvic pressure. She describes back pain as constant and rates 8/10. She reports spotting 2 days ago, dark brown, she had IC 1 day prior to that. She denies urinary sx. She reports +FM. No ctx, VB or LOF. She has not used analgesics. She has used a maternity support belt with no relief. She was in a childbirth class this am and read about signs of PTL, discussed her sx with the instructor and was told to come for evaluation. Review of records reveals pregnancy has been complicated by Hypothyroidism.     OB History    Gravida Para Term Preterm AB Living   2       1 0   SAB TAB Ectopic Multiple Live Births         0        Past Medical History:  Diagnosis Date  . Anxiety   . Arrhythmia    tachycardia  . Depression   . Fatigue   . Headache(784.0)   . Hypothyroid   . PTSD (post-traumatic stress disorder)   . Seizures ([place])    Medication related seizure x 1    Past Surgical History:  Procedure Laterality Date  . CHOLECYSTECTOMY    . WISDOM TOOTH EXTRACTION      Family History  Problem Relation Age of Onset  . Alcohol abuse Father   . Drug abuse Father   . Hypertension Father   . Hypertension Mother   . Diabetes Mother   . Leukemia Maternal Grandmother   . Breast cancer Maternal Aunt     Social History  Substance Use Topics  . Smoking status: Former Smoker    Types: Cigarettes  . Smokeless tobacco: Never Used  . Alcohol use No    Allergies:  Allergies  Allergen Reactions  . Morphine And Related Nausea And Vomiting    Prescriptions Prior to Admission  Medication Sig Dispense Refill Last Dose  . calcium carbonate (TUMS - DOSED IN MG ELEMENTAL CALCIUM) 500 MG chewable tablet Chew 1-2 tablets by mouth as needed for indigestion  or heartburn.   10/15/2016 at Unknown time  . hydrocortisone (ANUSOL-HC) 25 MG suppository Place 1 suppository (25 mg total) rectally 2 (two) times daily. 12 suppository 1 Past Week at Unknown time  . levothyroxine (SYNTHROID, LEVOTHROID) 25 MCG tablet Take 1 tablet (25 mcg total) by mouth daily before breakfast. 30 tablet 3 10/15/2016 at Unknown time  . ondansetron (ZOFRAN ODT) 4 MG disintegrating tablet Take 1 tablet (4 mg total) by mouth every 8 (eight) hours as needed for nausea or vomiting. 8 tablet 0 10/15/2016 at Unknown time  . Prenat-FeAsp-Meth-FA-DHA w/o A (PRENATE PIXIE) 10-0.6-0.4-200 MG CAPS Take 1 capsule by mouth daily.   10/15/2016 at Unknown time  . promethazine (PHENERGAN) 25 MG suppository Place 1 suppository (25 mg total) rectally every 6 (six) hours as needed for refractory nausea / vomiting. 20 suppository 0 Past Week at Unknown time    Review of Systems  Genitourinary: Positive for pelvic pain (pressure). Negative for dysuria, frequency, hematuria, urgency and vaginal bleeding.  Musculoskeletal: Positive for back pain.   Physical Exam   Blood pressure 114/74, pulse 99, temperature 97.7 F (36.5 C), resp. rate 18, height 5'  4" (1.626 m), weight 93.4 kg (206 lb), SpO2 100 %, unknown if currently breastfeeding.  Physical Exam  Nursing note and vitals reviewed. Constitutional: She is oriented to person, place, and time. She appears well-developed and well-nourished. No distress (appears comfortable).  HENT:  Head: Normocephalic and atraumatic.  Neck: Normal range of motion.  Cardiovascular: Normal rate.   Respiratory: Effort normal.  GI: Soft. She exhibits no distension. There is no tenderness.  Gravid   Genitourinary:  Genitourinary Comments: SVE: closed/thick  Musculoskeletal: Normal range of motion.  Neurological: She is alert and oriented to person, place, and time.  Skin: Skin is warm and dry.  Psychiatric: She has a normal mood and affect.   EFM: 145 bpm, mod  variability, + accels, no decels Toco: none  Results for orders placed or performed during the hospital encounter of 10/15/16 (from the past 24 hour(s))  Urinalysis, Routine w reflex microscopic     Status: Abnormal   Collection Time: 10/15/16 10:43 AM  Result Value Ref Range   Color, Urine YELLOW YELLOW   APPearance HAZY (A) CLEAR   Specific Gravity, Urine 1.011 1.005 - 1.030   pH 9.0 (H) 5.0 - 8.0   Glucose, UA NEGATIVE NEGATIVE mg/dL   Hgb urine dipstick NEGATIVE NEGATIVE   Bilirubin Urine NEGATIVE NEGATIVE   Ketones, ur NEGATIVE NEGATIVE mg/dL   Protein, ur NEGATIVE NEGATIVE mg/dL   Nitrite NEGATIVE NEGATIVE   Leukocytes, UA NEGATIVE NEGATIVE   MAU Course  Procedures  MDM Labs ordered and reviewed. No evidence of UTI or PTL. Pressure likely fetal engagement. Stable for discharge home. GBS culture collected.   Assessment and Plan   1. [redacted] weeks gestation of pregnancy   2. NST (non-stress test) reactive   3. Pelvic pressure in pregnancy, antepartum, third trimester   4. Back pain affecting pregnancy in third trimester   5.      Hypothyroidism complicating pregnancy  Discharge home Follow up in office as scheduled this week PTL precautions Comfort measures for back pain  Allergies as of 10/15/2016      Reactions   Morphine And Related Nausea And Vomiting      Medication List    TAKE these medications   calcium carbonate 500 MG chewable tablet Commonly known as:  TUMS - dosed in mg elemental calcium Chew 1-2 tablets by mouth as needed for indigestion or heartburn.   hydrocortisone 25 MG suppository Commonly known as:  ANUSOL-HC Place 1 suppository (25 mg total) rectally 2 (two) times daily.   levothyroxine 25 MCG tablet Commonly known as:  SYNTHROID, LEVOTHROID Take 1 tablet (25 mcg total) by mouth daily before breakfast.   ondansetron 4 MG disintegrating tablet Commonly known as:  ZOFRAN ODT Take 1 tablet (4 mg total) by mouth every 8 (eight) hours as needed  for nausea or vomiting.   PRENATE PIXIE 10-0.6-0.4-200 MG Caps Take 1 capsule by mouth daily.   promethazine 25 MG suppository Commonly known as:  PHENERGAN Place 1 suppository (25 mg total) rectally every 6 (six) hours as needed for refractory nausea / vomiting.      Julianne Handler, CNM 10/15/2016, 11:10 AM

## 2016-10-17 LAB — CULTURE, BETA STREP (GROUP B ONLY)

## 2016-10-17 LAB — OB RESULTS CONSOLE GBS: STREP GROUP B AG: NEGATIVE

## 2016-10-19 ENCOUNTER — Encounter: Payer: Self-pay | Admitting: Family Medicine

## 2016-10-26 ENCOUNTER — Ambulatory Visit (INDEPENDENT_AMBULATORY_CARE_PROVIDER_SITE_OTHER): Payer: Medicaid Other | Admitting: Family Medicine

## 2016-10-26 VITALS — BP 112/78 | HR 100 | Wt 208.0 lb

## 2016-10-26 DIAGNOSIS — E038 Other specified hypothyroidism: Secondary | ICD-10-CM

## 2016-10-26 DIAGNOSIS — Z3483 Encounter for supervision of other normal pregnancy, third trimester: Secondary | ICD-10-CM

## 2016-10-26 NOTE — Progress Notes (Signed)
   PRENATAL VISIT NOTE  Subjective:  Michele Armstrong is a 31 y.o. G2P0010 at [redacted]w[redacted]d being seen today for ongoing prenatal care.  She is currently monitored for the following issues for this low-risk pregnancy and has PTSD (post-traumatic stress disorder); Depression, major, recurrent ([place]); Hypothyroid; Supervision of normal pregnancy; and Constipation during pregnancy on her problem list.  Patient reports no complaints.  Contractions: Irritability. Vag. Bleeding: None.  Movement: Present. Denies leaking of fluid.   The following portions of the patient's history were reviewed and updated as appropriate: allergies, current medications, past family history, past medical history, past social history, past surgical history and problem list. Problem list updated.  Objective:   Vitals:   10/26/16 1417  BP: 112/78  Pulse: 100  Weight: 208 lb (94.3 kg)    Fetal Status: Fetal Heart Rate (bpm): 145 Fundal Height: 37 cm Movement: Present  Presentation: Vertex  General:  Alert, oriented and cooperative. Patient is in no acute distress.  Skin: Skin is warm and dry. No rash noted.   Cardiovascular: Normal heart rate noted  Respiratory: Normal respiratory effort, no problems with respiration noted  Abdomen: Soft, gravid, appropriate for gestational age. Pain/Pressure: Present     Pelvic:  Cervical exam performed Dilation: 1 Effacement (%): Thick Station: Ballotable  Extremities: Normal range of motion.  Edema: None  Mental Status: Normal mood and affect. Normal behavior. Normal judgment and thought content.   Assessment and Plan:  Pregnancy: G2P0010 at [redacted]w[redacted]d  1. Encounter for supervision of other normal pregnancy in third trimester FHT and FH normal  2. Hypothyroidism Check TSH next visit  Term labor symptoms and general obstetric precautions including but not limited to vaginal bleeding, contractions, leaking of fluid and fetal movement were reviewed in detail with the patient. Please refer  to After Visit Summary for other counseling recommendations.  No Follow-up on file.   Truett Mainland, DO

## 2016-11-02 ENCOUNTER — Ambulatory Visit (INDEPENDENT_AMBULATORY_CARE_PROVIDER_SITE_OTHER): Payer: Medicaid Other | Admitting: Family Medicine

## 2016-11-02 VITALS — BP 115/54 | HR 105 | Wt 221.0 lb

## 2016-11-02 DIAGNOSIS — E038 Other specified hypothyroidism: Secondary | ICD-10-CM

## 2016-11-02 DIAGNOSIS — Z3483 Encounter for supervision of other normal pregnancy, third trimester: Secondary | ICD-10-CM

## 2016-11-02 NOTE — Progress Notes (Signed)
   PRENATAL VISIT NOTE  Subjective:  Marissah Buri is a 31 y.o. G2P0010 at [redacted]w[redacted]d being seen today for ongoing prenatal care.  She is currently monitored for the following issues for this low-risk pregnancy and has PTSD (post-traumatic stress disorder); Depression, major, recurrent ([place]); Hypothyroid; Supervision of normal pregnancy; and Constipation during pregnancy on her problem list.  Patient reports no complaints.  Contractions: Irritability. Vag. Bleeding: Scant.  Movement: Present. Denies leaking of fluid.   The following portions of the patient's history were reviewed and updated as appropriate: allergies, current medications, past family history, past medical history, past social history, past surgical history and problem list. Problem list updated.  Objective:   Vitals:   11/02/16 1423  BP: (!) 115/54  Pulse: (!) 105  Weight: 221 lb (100.2 kg)    Fetal Status: Fetal Heart Rate (bpm): 155 Fundal Height: 38 cm Movement: Present  Presentation: Vertex  General:  Alert, oriented and cooperative. Patient is in no acute distress.  Skin: Skin is warm and dry. No rash noted.   Cardiovascular: Normal heart rate noted  Respiratory: Normal respiratory effort, no problems with respiration noted  Abdomen: Soft, gravid, appropriate for gestational age. Pain/Pressure: Present     Pelvic:  Cervical exam performed Dilation: Fingertip Effacement (%): Thick Station: Ballotable  Extremities: Normal range of motion.  Edema: None  Mental Status: Normal mood and affect. Normal behavior. Normal judgment and thought content.   Assessment and Plan:  Pregnancy: G2P0010 at [redacted]w[redacted]d  1. Encounter for supervision of other normal pregnancy in third trimester FHT and FH normal  2. Other specified hypothyroidism - TSH  Term labor symptoms and general obstetric precautions including but not limited to vaginal bleeding, contractions, leaking of fluid and fetal movement were reviewed in detail with the  patient. Please refer to After Visit Summary for other counseling recommendations.  Return in about 1 week (around 11/09/2016).   Truett Mainland, DO

## 2016-11-03 LAB — TSH: TSH: 3.82 u[IU]/mL (ref 0.450–4.500)

## 2016-11-04 ENCOUNTER — Other Ambulatory Visit: Payer: Self-pay

## 2016-11-04 MED ORDER — ONDANSETRON 4 MG PO TBDP: 4.0000 mg | ORAL_TABLET | Freq: Three times a day (TID) | ORAL | 0 refills | Status: DC | PRN

## 2016-11-04 NOTE — Progress Notes (Signed)
Patient requesting a refill on Zofran due to her increase in nausea. Kathrene Alu RNBSN

## 2016-11-10 ENCOUNTER — Ambulatory Visit (INDEPENDENT_AMBULATORY_CARE_PROVIDER_SITE_OTHER): Payer: Medicaid Other | Admitting: Family Medicine

## 2016-11-10 VITALS — BP 115/78 | HR 107 | Temp 98.0°F | Wt 213.0 lb

## 2016-11-10 DIAGNOSIS — O219 Vomiting of pregnancy, unspecified: Secondary | ICD-10-CM

## 2016-11-10 DIAGNOSIS — Z3483 Encounter for supervision of other normal pregnancy, third trimester: Secondary | ICD-10-CM

## 2016-11-10 DIAGNOSIS — R1111 Vomiting without nausea: Secondary | ICD-10-CM

## 2016-11-10 MED ORDER — PROCHLORPERAZINE MALEATE 10 MG PO TABS: 10.0000 mg | ORAL_TABLET | Freq: Four times a day (QID) | ORAL | 3 refills | Status: DC | PRN

## 2016-11-10 NOTE — Progress Notes (Signed)
   PRENATAL VISIT NOTE  Subjective:  Michele Armstrong is a 31 y.o. G2P0010 at [redacted]w[redacted]d being seen today for ongoing prenatal care.  She is currently monitored for the following issues for this low-risk pregnancy and has PTSD (post-traumatic stress disorder); Depression, major, recurrent ([place]); Hypothyroid; Supervision of normal pregnancy; and Constipation during pregnancy on her problem list.  Patient reports vomiting. Occurs nightly, starts around 2 am and last for a few hours. Phenergan suppositories are helpful, although hemorroids are worsening. Contractions: Irritability. Vag. Bleeding: None.  Movement: Present. Denies leaking of fluid.   The following portions of the patient's history were reviewed and updated as appropriate: allergies, current medications, past family history, past medical history, past social history, past surgical history and problem list. Problem list updated.  Objective:   Vitals:   11/10/16 0959  BP: 115/78  Pulse: (!) 107  Temp: 98 F (36.7 C)  Weight: 213 lb (96.6 kg)    Fetal Status: Fetal Heart Rate (bpm): 143 Fundal Height: 40 cm Movement: Present  Presentation: Vertex  General:  Alert, oriented and cooperative. Patient is in no acute distress.  Skin: Skin is warm and dry. No rash noted.   Cardiovascular: Normal heart rate noted  Respiratory: Normal respiratory effort, no problems with respiration noted  Abdomen: Soft, gravid, appropriate for gestational age. Pain/Pressure: Present     Pelvic:  Cervical exam performed Dilation: 1 Effacement (%): 50 Station: Ballotable  Extremities: Normal range of motion.  Edema: None  Mental Status: Normal mood and affect. Normal behavior. Normal judgment and thought content.   Assessment and Plan:  Pregnancy: G2P0010 at [redacted]w[redacted]d  1. Encounter for supervision of other normal pregnancy in third trimester FHT and FH normal  2. Vomiting without nausea, intractability of vomiting not specified, unspecified vomiting  type Compazine prescribed. Pt to notify if not helpful.  Term labor symptoms and general obstetric precautions including but not limited to vaginal bleeding, contractions, leaking of fluid and fetal movement were reviewed in detail with the patient. Please refer to After Visit Summary for other counseling recommendations.  No Follow-up on file.   Truett Mainland, DO

## 2016-11-10 NOTE — Progress Notes (Signed)
Pt states that she has had vomiting every night for 2 or 3 hours. She states that she has no fever and no other symptoms.

## 2016-11-16 ENCOUNTER — Telehealth (HOSPITAL_COMMUNITY): Payer: Self-pay | Admitting: *Deleted

## 2016-11-16 ENCOUNTER — Ambulatory Visit (INDEPENDENT_AMBULATORY_CARE_PROVIDER_SITE_OTHER): Payer: Medicaid Other | Admitting: Family Medicine

## 2016-11-16 VITALS — BP 115/73 | HR 89 | Wt 214.0 lb

## 2016-11-16 DIAGNOSIS — Z3483 Encounter for supervision of other normal pregnancy, third trimester: Secondary | ICD-10-CM

## 2016-11-16 NOTE — Telephone Encounter (Signed)
Preadmission screen  

## 2016-11-16 NOTE — Progress Notes (Signed)
   PRENATAL VISIT NOTE  Subjective:  Michele Armstrong is a 32 y.o. G2P0010 at [redacted]w[redacted]d being seen today for ongoing prenatal care.  She is currently monitored for the following issues for this low-risk pregnancy and has PTSD (post-traumatic stress disorder); Depression, major, recurrent ([place]); Hypothyroid; Supervision of normal pregnancy; and Constipation during pregnancy on her problem list.  Patient reports a lot of cramping since last appt..  Contractions: Irritability. Vag. Bleeding: None.  Movement: Present. Denies leaking of fluid.   The following portions of the patient's history were reviewed and updated as appropriate: allergies, current medications, past family history, past medical history, past social history, past surgical history and problem list. Problem list updated.  Objective:   Vitals:   11/16/16 1448  BP: 115/73  Pulse: 89  Weight: 214 lb (97.1 kg)    Fetal Status: Fetal Heart Rate (bpm): 122 (Simultaneous filing. User may not have seen previous data.)   Movement: Present  Presentation: Vertex  General:  Alert, oriented and cooperative. Patient is in no acute distress.  Skin: Skin is warm and dry. No rash noted.   Cardiovascular: Normal heart rate noted  Respiratory: Normal respiratory effort, no problems with respiration noted  Abdomen: Soft, gravid, appropriate for gestational age. Pain/Pressure: Present     Pelvic:  Cervical exam performed Dilation: 1.5 Effacement (%): 60 Station: -3  Extremities: Normal range of motion.  Edema: Trace  Mental Status: Normal mood and affect. Normal behavior. Normal judgment and thought content.   Assessment and Plan:  Pregnancy: G2P0010 at [redacted]w[redacted]d  1. Encounter for supervision of other normal pregnancy in third trimester FH normal. FHT lower than normal - NST done and reactive. Induction scheduled for 41 weeks   Term labor symptoms and general obstetric precautions including but not limited to vaginal bleeding, contractions,  leaking of fluid and fetal movement were reviewed in detail with the patient. Please refer to After Visit Summary for other counseling recommendations.  No Follow-up on file.   Truett Mainland, DO

## 2016-11-21 ENCOUNTER — Encounter: Payer: Self-pay | Admitting: Family Medicine

## 2016-11-23 ENCOUNTER — Inpatient Hospital Stay (HOSPITAL_COMMUNITY): Payer: Medicaid Other | Admitting: Anesthesiology

## 2016-11-23 ENCOUNTER — Inpatient Hospital Stay (HOSPITAL_COMMUNITY)
Admission: RE | Admit: 2016-11-23 | Discharge: 2016-11-25 | DRG: 775 | Disposition: A | Discharge status: Home / Self Care | Payer: Medicaid Other | Source: Ambulatory Visit | Attending: Obstetrics & Gynecology | Admitting: Obstetrics & Gynecology

## 2016-11-23 ENCOUNTER — Encounter (HOSPITAL_COMMUNITY): Payer: Self-pay

## 2016-11-23 DIAGNOSIS — Z87891 Personal history of nicotine dependence: Secondary | ICD-10-CM | POA: Diagnosis not present

## 2016-11-23 DIAGNOSIS — R12 Heartburn: Secondary | ICD-10-CM | POA: Diagnosis not present

## 2016-11-23 DIAGNOSIS — O480 Post-term pregnancy: Principal | ICD-10-CM | POA: Diagnosis present

## 2016-11-23 DIAGNOSIS — O99284 Endocrine, nutritional and metabolic diseases complicating childbirth: Secondary | ICD-10-CM | POA: Diagnosis present

## 2016-11-23 DIAGNOSIS — Z3A41 41 weeks gestation of pregnancy: Secondary | ICD-10-CM

## 2016-11-23 DIAGNOSIS — O99344 Other mental disorders complicating childbirth: Secondary | ICD-10-CM | POA: Diagnosis present

## 2016-11-23 DIAGNOSIS — F339 Major depressive disorder, recurrent, unspecified: Secondary | ICD-10-CM | POA: Diagnosis present

## 2016-11-23 DIAGNOSIS — E039 Hypothyroidism, unspecified: Secondary | ICD-10-CM | POA: Diagnosis present

## 2016-11-23 LAB — TYPE AND SCREEN
ABO/RH(D): O POS
Antibody Screen: NEGATIVE

## 2016-11-23 LAB — CBC
HCT: 32 % — ABNORMAL LOW (ref 36.0–46.0)
Hemoglobin: 10.8 g/dL — ABNORMAL LOW (ref 12.0–15.0)
MCH: 28.6 pg (ref 26.0–34.0)
MCHC: 33.8 g/dL (ref 30.0–36.0)
MCV: 84.9 fL (ref 78.0–100.0)
PLATELETS: 240 10*3/uL (ref 150–400)
RBC: 3.77 MIL/uL — AB (ref 3.87–5.11)
RDW: 15.7 % — ABNORMAL HIGH (ref 11.5–15.5)
WBC: 11 10*3/uL — ABNORMAL HIGH (ref 4.0–10.5)

## 2016-11-23 LAB — ABO/RH: ABO/RH(D): O POS

## 2016-11-23 MED ORDER — LACTATED RINGERS IV SOLN
INTRAVENOUS | Status: DC
  Administered 2016-11-23 (×3): via INTRAVENOUS

## 2016-11-23 MED ORDER — OXYTOCIN BOLUS FROM INFUSION
500.0000 mL | Freq: Once | INTRAVENOUS | Status: AC
  Administered 2016-11-24: 500 mL via INTRAVENOUS

## 2016-11-23 MED ORDER — SOD CITRATE-CITRIC ACID 500-334 MG/5ML PO SOLN
30.0000 mL | ORAL | Status: DC | PRN
  Administered 2016-11-23 – 2016-11-24 (×4): 30 mL via ORAL
  Filled 2016-11-23 (×4): qty 15

## 2016-11-23 MED ORDER — LIDOCAINE HCL (PF) 1 % IJ SOLN
INTRAMUSCULAR | Status: DC | PRN
  Administered 2016-11-23 (×2): 6 mL via EPIDURAL

## 2016-11-23 MED ORDER — LACTATED RINGERS IV SOLN: 500.0000 mL | INTRAVENOUS | Status: DC | PRN

## 2016-11-23 MED ORDER — OXYTOCIN 40 UNITS IN LACTATED RINGERS INFUSION - SIMPLE MED
1.0000 m[IU]/min | INTRAVENOUS | Status: DC
  Administered 2016-11-23: 2 m[IU]/min via INTRAVENOUS

## 2016-11-23 MED ORDER — PROMETHAZINE HCL 25 MG/ML IJ SOLN
25.0000 mg | Freq: Four times a day (QID) | INTRAMUSCULAR | Status: DC | PRN
Start: 2016-11-23 — End: 2016-11-24

## 2016-11-23 MED ORDER — LEVOTHYROXINE SODIUM 25 MCG PO TABS
25.0000 ug | ORAL_TABLET | Freq: Every day | ORAL | Status: DC
  Administered 2016-11-24 – 2016-11-25 (×2): 25 ug via ORAL
  Filled 2016-11-23 (×5): qty 1

## 2016-11-23 MED ORDER — LACTATED RINGERS IV SOLN
500.0000 mL | Freq: Once | INTRAVENOUS | Status: AC
  Administered 2016-11-23: 500 mL via INTRAVENOUS

## 2016-11-23 MED ORDER — PHENYLEPHRINE 40 MCG/ML (10ML) SYRINGE FOR IV PUSH (FOR BLOOD PRESSURE SUPPORT)
80.0000 ug | PREFILLED_SYRINGE | INTRAVENOUS | Status: DC | PRN
  Filled 2016-11-23: qty 10
  Filled 2016-11-23: qty 5

## 2016-11-23 MED ORDER — PHENYLEPHRINE 40 MCG/ML (10ML) SYRINGE FOR IV PUSH (FOR BLOOD PRESSURE SUPPORT)
80.0000 ug | PREFILLED_SYRINGE | INTRAVENOUS | Status: DC | PRN
  Filled 2016-11-23: qty 5

## 2016-11-23 MED ORDER — EPHEDRINE 5 MG/ML INJ
10.0000 mg | INTRAVENOUS | Status: DC | PRN
  Filled 2016-11-23: qty 4

## 2016-11-23 MED ORDER — ONDANSETRON HCL 4 MG/2ML IJ SOLN
4.0000 mg | Freq: Four times a day (QID) | INTRAMUSCULAR | Status: DC | PRN
  Administered 2016-11-23: 4 mg via INTRAVENOUS
  Filled 2016-11-23: qty 2

## 2016-11-23 MED ORDER — MISOPROSTOL 25 MCG QUARTER TABLET
25.0000 ug | ORAL_TABLET | ORAL | Status: DC | PRN
  Administered 2016-11-23: 25 ug via VAGINAL
  Filled 2016-11-23: qty 1
  Filled 2016-11-23: qty 0.25

## 2016-11-23 MED ORDER — ACETAMINOPHEN 325 MG PO TABS: 650.0000 mg | ORAL_TABLET | ORAL | Status: DC | PRN

## 2016-11-23 MED ORDER — TERBUTALINE SULFATE 1 MG/ML IJ SOLN
0.2500 mg | Freq: Once | INTRAMUSCULAR | Status: DC | PRN
  Filled 2016-11-23: qty 1

## 2016-11-23 MED ORDER — LIDOCAINE HCL (PF) 1 % IJ SOLN
30.0000 mL | INTRAMUSCULAR | Status: DC | PRN
  Filled 2016-11-23: qty 30

## 2016-11-23 MED ORDER — FAMOTIDINE IN NACL 20-0.9 MG/50ML-% IV SOLN: 20.0000 mg | Freq: Once | INTRAVENOUS | Status: DC

## 2016-11-23 MED ORDER — OXYTOCIN 40 UNITS IN LACTATED RINGERS INFUSION - SIMPLE MED
2.5000 [IU]/h | INTRAVENOUS | Status: DC
  Filled 2016-11-23: qty 1000

## 2016-11-23 MED ORDER — DIPHENHYDRAMINE HCL 50 MG/ML IJ SOLN: 12.5000 mg | INTRAMUSCULAR | Status: DC | PRN

## 2016-11-23 MED ORDER — FENTANYL 2.5 MCG/ML BUPIVACAINE 1/10 % EPIDURAL INFUSION (WH - ANES)
14.0000 mL/h | INTRAMUSCULAR | Status: DC | PRN
  Administered 2016-11-23 – 2016-11-24 (×3): 14 mL/h via EPIDURAL
  Filled 2016-11-23 (×3): qty 100

## 2016-11-23 NOTE — Anesthesia Preprocedure Evaluation (Signed)
Anesthesia Evaluation  Patient identified by MRN, date of birth, ID band Patient awake    Reviewed: Allergy & Precautions, H&P , NPO status , Patient's Chart, lab work & pertinent test results  Airway Mallampati: II  TM Distance: >3 FB Neck ROM: full    Dental no notable dental hx.    Pulmonary former smoker,    Pulmonary exam normal        Cardiovascular negative cardio ROS Normal cardiovascular exam     Neuro/Psych    GI/Hepatic negative GI ROS, Neg liver ROS,   Endo/Other    Renal/GU negative Renal ROS     Musculoskeletal   Abdominal (+) + obese,   Peds  Hematology negative hematology ROS (+)   Anesthesia Other Findings   Reproductive/Obstetrics (+) Pregnancy                             Anesthesia Physical Anesthesia Plan  ASA: II  Anesthesia Plan: Epidural   Post-op Pain Management:    Induction:   Airway Management Planned:   Additional Equipment:   Intra-op Plan:   Post-operative Plan:   Informed Consent: I have reviewed the patients History and Physical, chart, labs and discussed the procedure including the risks, benefits and alternatives for the proposed anesthesia with the patient or authorized representative who has indicated his/her understanding and acceptance.     Plan Discussed with:   Anesthesia Plan Comments:         Anesthesia Quick Evaluation

## 2016-11-23 NOTE — Anesthesia Procedure Notes (Signed)
Epidural Patient location during procedure: OB Start time: 11/23/2016 1:45 PM End time: 11/23/2016 1:47 PM  Staffing Anesthesiologist: Lyn Hollingshead Performed: anesthesiologist   Preanesthetic Checklist Completed: patient identified, surgical consent, pre-op evaluation, timeout performed, IV checked, risks and benefits discussed and monitors and equipment checked  Epidural Patient position: sitting Prep: site prepped and draped and DuraPrep Patient monitoring: continuous pulse ox and blood pressure Approach: midline Injection technique: LOR air  Needle:  Needle type: Tuohy  Needle gauge: 17 G Needle length: 9 cm and 9 Needle insertion depth: 5 cm cm Catheter type: closed end flexible Catheter size: 19 Gauge Catheter at skin depth: 10 cm Test dose: negative and Other  Assessment Sensory level: T9 Events: blood not aspirated, injection not painful, no injection resistance, negative IV test and no paresthesia  Additional Notes Reason for block:procedure for pain

## 2016-11-23 NOTE — Progress Notes (Signed)
Michele Armstrong is a 31 y.o. G2P0010 at [redacted]w[redacted]d  admitted for induction of labor due to post dates.  Subjective:  Feeling well. Some nausea, heartburn and cramping. Would like meds for heartburn and nausea.  Objective: BP 123/82   Pulse 92   Temp 98.7 F (37.1 C) (Oral)   Resp 18   Ht 5\' 4"  (1.626 m)   Wt 215 lb (97.5 kg)   BMI 36.90 kg/m  No intake/output data recorded. No intake/output data recorded.  FHT:  FHR: 145 bpm, variability: moderate,  accelerations:  Present,  decelerations:  Absent UC:   irregular, every 10+ minutes SVE:   Dilation: 1.5 Effacement (%): 30 Station: -2 Exam by:: raney gagnon rnc  Labs: Lab Results  Component Value Date   WBC 11.0 (H) 11/23/2016   HGB 10.8 (L) 11/23/2016   HCT 32.0 (L) 11/23/2016   MCV 84.9 11/23/2016   PLT 240 11/23/2016    Assessment / Plan: IOL, cervical ripening with cytotec   Labor: cervical ripening  Preeclampsia:  NA Fetal Wellbeing:  Category I Pain Control:  Labor support without medications I/D:  n/a Anticipated MOD:  NSVD  Mathis Bud 11/23/2016, 10:35 AM

## 2016-11-23 NOTE — Anesthesia Pain Management Evaluation Note (Signed)
  CRNA Pain Management Visit Note  Patient: Michele Armstrong, 31 y.o., female  "Hello I am a member of the anesthesia team at [facility]. We have an anesthesia team available at all times to provide care throughout the hospital, including epidural management and anesthesia for C-section. I don't know your plan for the delivery whether it a natural birth, water birth, IV sedation, nitrous supplementation, doula or epidural, but we want to meet your pain goals."   1.Was your pain managed to your expectations on prior hospitalizations?   No prior hospitalizations  2.What is your expectation for pain management during this hospitalization?     Epidural  3.How can we help you reach that goal? Epidural at some point.  Record the patient's initial score and the patient's pain goal.   Pain: 4  Pain Goal: 8 [facility] wants you to be able to say your pain was always managed very well.  Arantxa Piercey 11/23/2016
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
## 2016-11-24 ENCOUNTER — Encounter (HOSPITAL_COMMUNITY): Payer: Self-pay

## 2016-11-24 DIAGNOSIS — Z3A41 41 weeks gestation of pregnancy: Secondary | ICD-10-CM

## 2016-11-24 DIAGNOSIS — O480 Post-term pregnancy: Secondary | ICD-10-CM

## 2016-11-24 LAB — RPR: RPR Ser Ql: NONREACTIVE

## 2016-11-24 MED ORDER — BENZOCAINE-MENTHOL 20-0.5 % EX AERO
1.0000 "application " | INHALATION_SPRAY | CUTANEOUS | Status: DC | PRN
  Administered 2016-11-24: 1 via TOPICAL
  Filled 2016-11-24: qty 56

## 2016-11-24 MED ORDER — FAMOTIDINE 20 MG PO TABS
10.0000 mg | ORAL_TABLET | Freq: Every day | ORAL | Status: DC
  Administered 2016-11-24 – 2016-11-25 (×2): 10 mg via ORAL
  Filled 2016-11-24 (×2): qty 1

## 2016-11-24 MED ORDER — IBUPROFEN 600 MG PO TABS
600.0000 mg | ORAL_TABLET | Freq: Four times a day (QID) | ORAL | Status: DC
  Administered 2016-11-24 – 2016-11-25 (×5): 600 mg via ORAL
  Filled 2016-11-24 (×6): qty 1

## 2016-11-24 MED ORDER — ZOLPIDEM TARTRATE 5 MG PO TABS: 5.0000 mg | ORAL_TABLET | Freq: Every evening | ORAL | Status: DC | PRN

## 2016-11-24 MED ORDER — DIPHENHYDRAMINE HCL 25 MG PO CAPS: 25.0000 mg | ORAL_CAPSULE | Freq: Four times a day (QID) | ORAL | Status: DC | PRN

## 2016-11-24 MED ORDER — TETANUS-DIPHTH-ACELL PERTUSSIS 5-2.5-18.5 LF-MCG/0.5 IM SUSP: 0.5000 mL | Freq: Once | INTRAMUSCULAR | Status: DC

## 2016-11-24 MED ORDER — SENNOSIDES-DOCUSATE SODIUM 8.6-50 MG PO TABS
2.0000 | ORAL_TABLET | ORAL | Status: DC
  Administered 2016-11-25: 2 via ORAL
  Filled 2016-11-24: qty 2

## 2016-11-24 MED ORDER — PRENATAL MULTIVITAMIN CH
1.0000 | ORAL_TABLET | Freq: Every day | ORAL | Status: DC
  Administered 2016-11-24 – 2016-11-25 (×2): 1 via ORAL
  Filled 2016-11-24 (×2): qty 1

## 2016-11-24 MED ORDER — WITCH HAZEL-GLYCERIN EX PADS: 1.0000 "application " | MEDICATED_PAD | CUTANEOUS | Status: DC | PRN

## 2016-11-24 MED ORDER — ACETAMINOPHEN 325 MG PO TABS
650.0000 mg | ORAL_TABLET | ORAL | Status: DC | PRN
  Administered 2016-11-24: 650 mg via ORAL
  Filled 2016-11-24: qty 2

## 2016-11-24 MED ORDER — COCONUT OIL OIL: 1.0000 "application " | TOPICAL_OIL | Status: DC | PRN

## 2016-11-24 MED ORDER — ONDANSETRON HCL 4 MG/2ML IJ SOLN: 4.0000 mg | INTRAMUSCULAR | Status: DC | PRN

## 2016-11-24 MED ORDER — VITAMIN K1 1 MG/0.5ML IJ SOLN
INTRAMUSCULAR | Status: AC
  Filled 2016-11-24: qty 0.5

## 2016-11-24 MED ORDER — ONDANSETRON HCL 4 MG PO TABS: 4.0000 mg | ORAL_TABLET | ORAL | Status: DC | PRN

## 2016-11-24 MED ORDER — SIMETHICONE 80 MG PO CHEW: 80.0000 mg | CHEWABLE_TABLET | ORAL | Status: DC | PRN

## 2016-11-24 MED ORDER — DIBUCAINE 1 % RE OINT: 1.0000 "application " | TOPICAL_OINTMENT | RECTAL | Status: DC | PRN

## 2016-11-24 NOTE — Lactation Note (Signed)
This note was copied from a baby's chart. Lactation Consultation Note  Mom is eager for baby to eat but baby is spitting and gaggy. Explained to parents how to help baby move the secretions and reviewed use of the bulb syringe.  Encouraged holding the baby upright for now.  Informed RN of current status. Follow-up later this evening  Patient Name: Michele Armstrong [id number] Date: 11/24/2016     Maternal Data    Feeding    LATCH Score/Interventions                      Lactation Tools Discussed/Used     Consult Status      [name] 11/24/2016, 3:31 PM

## 2016-11-24 NOTE — Lactation Note (Signed)
This note was copied from a baby's chart. Lactation Consultation Note  Patient Name: Michele Armstrong [id number] Date: 11/24/2016   Follow-up with patient since baby has been very spitty and not latching today.  RN in room with patient when [facility] came in.  RN stated baby is beginning to show more interest in feeding but infant was still spitting at the present.  Encouraged mom to keep trying to feed with frequent burping.  Encouraged mom to call for assistance as 3rd shift LC will be present tonight and RN has been assisting.  Mom can easily hand express milk to give baby but baby too spitty.      Merlene Laughter 11/24/2016, 10:18 PM

## 2016-11-24 NOTE — Lactation Note (Addendum)
This note was copied from a baby's chart. Lactation Consultation Note New mom has extremely large breast that are positioned down by side to moms side almost to her legs. everted Nipples at bottom end of breast, needs to be turned up to latch. Elevated breast w/wash clothes and pillows. Positioned  Baby in football position w/pillows to elevate breast. Breast tissue thin and stretched at the top, lots of glandular tissue at the bottom. Mom stated she has been leaking colostrum.  mom doesn't wear a bra at home, only when she is out. Encouraged mom that she will need to wear a bra.  Rt. Breast at areola and nipple has edema, compressed nipples flattens. Reverse pressure helpful. Hand expression colostrum helped as well. LC does not feel that NS is an option. Hand expression taught w/9.54ml collected. Encouraged to be careful not to lay breast on top of baby d/t heavy.  Mom has difficulty holding baby d/t breast large and reaching around, doesn't pick breast up well, can't see if baby is latching well, only by feeling. Baby has recessed chin, needs chin tug. FOB taught. Asked mom if pumping and bottle feeding colostrum and BM would be an option for her. Mom stated she was going to introduced a bottle around 2 months old, but she really just wants to BF for now if possible. Mom stated after she rest and learns to lift her breast better she feels she will be able to BF. LC gave more tips and ides for latching assistance. Also if cradle feeds or cross cradle, mom would have to prop breast on pillow, lay baby over to breast and BF that way. That would be an option, nipples turn pointing outwards.  LC elevated breast on pillows by moms side, lowered HOB to rest. Encouraged mom to wear a bra for support. Mom states she has difficulty buying a bra d/t size. Mom has to order on line. Mom states she wear a size "R". Before mom is d/c LC will evaluate bra fitting.  Reported to RN to set up DEBP. Mom encouraged to feed  baby 8-12 times/24 hours and with feeding cues. Educated about newborn behavior, encouraged STS, I&O. [place] brochure given w/resources, support groups and [place] services. Patient Name: Boy Samatha Lombera [id number] Date: 11/24/2016 Reason for consult: Initial assessment   Maternal Data Has patient been taught Hand Expression?: Yes Does the patient have breastfeeding experience prior to this delivery?: No  Feeding Feeding Type: Breast Fed Length of feed: 5 min  LATCH Score/Interventions Latch: Repeated attempts needed to sustain latch, nipple held in mouth throughout feeding, stimulation needed to elicit sucking reflex. Intervention(s): Adjust position;Breast massage;Breast compression  Audible Swallowing: A few with stimulation Intervention(s): Skin to skin;Hand expression Intervention(s): Hand expression;Alternate breast massage  Type of Nipple: Everted at rest and after stimulation  Comfort (Breast/Nipple): Soft / non-tender     Hold (Positioning): Full assist, staff holds infant at breast Intervention(s): Breastfeeding basics reviewed;Support Pillows;Position options;Skin to skin  LATCH Score: 6  Lactation Tools Discussed/Used     Consult Status Consult Status: Follow-up Date: 11/24/16 Follow-up type: In-patient    Theodoro Kalata 11/24/2016, 6:57 AM

## 2016-11-24 NOTE — Progress Notes (Signed)
Patient ID: Michele Armstrong, female   DOB: Apr 06, 1986, 31 y.o.   MRN: [id number]  Feeling some pressure, but overall comfortable w/ epidural  VSS, afeb FHR 140-150s, +accels, occ mi variables Ctx q 3-4 mins w/ Pit @ 41mu/min Cx 9/100/0 w/ recent SROM  IUP@term  Active labor/transition  Plan to recheck cx in 1-2 hrs or sooner prn Anticipate SVD  SHAW, KIMBERLY CNM 11/24/2016 1:28 AM

## 2016-11-24 NOTE — Anesthesia Postprocedure Evaluation (Signed)
Anesthesia Post Note  Patient: Leisure centre manager  Procedure(s) Performed: * No procedures listed *  Patient location during evaluation: Mother Baby Anesthesia Type: Epidural Level of consciousness: awake and alert and oriented Pain management: pain level controlled Vital Signs Assessment: post-procedure vital signs reviewed and stable Respiratory status: spontaneous breathing and nonlabored ventilation Cardiovascular status: blood pressure returned to baseline and stable Postop Assessment: no headache, epidural receding, patient able to bend at knees, adequate PO intake, no backache and no signs of nausea or vomiting Anesthetic complications: no        Last Vitals:  Vitals:   11/24/16 0515 11/24/16 0608  BP:  119/66  Pulse:  (!) 106  Resp:    Temp: 37 C     Last Pain:  Vitals:   11/24/16 0608  TempSrc:   PainSc: 0-No pain   Pain Goal:                 Jabier Mutton

## 2016-11-25 ENCOUNTER — Ambulatory Visit: Payer: Self-pay

## 2016-11-25 MED ORDER — NORETHINDRONE 0.35 MG PO TABS: 1.0000 | ORAL_TABLET | Freq: Every day | ORAL | 11 refills | Status: DC

## 2016-11-25 MED ORDER — IBUPROFEN 600 MG PO TABS: 600.0000 mg | ORAL_TABLET | Freq: Four times a day (QID) | ORAL | 0 refills | Status: DC

## 2016-11-25 NOTE — Lactation Note (Signed)
This note was copied from a baby's chart. Lactation Consultation Note  Patient Name: Michele Armstrong [id number] Date: 11/25/2016   Checked in with Mom.  Baby sleeping swaddled in crib.  Mom states baby just started BFing, and finished 20 minutes prior.  Mom has EBM at bedside, saying that baby wouldn't take the syringe after breastfeeding.  Mom has pumped once, and encouraged her to continue pumping after baby BFs.  Encouraged Mom to place baby STS again in an hour to help baby breastfeed every 2 hrs today.   Asked RN to finger feed baby the EBM while Mom pumps.   To assist with next BFing.  Mom to ask for help.      Michele Armstrong 11/25/2016, 12:35 PM

## 2016-11-25 NOTE — Discharge Instructions (Signed)

## 2016-11-25 NOTE — Lactation Note (Signed)
This note was copied from a baby's chart. Lactation Consultation Note Baby has been very spitty w/no interest in BF. Mom and baby sleeping. Sign on door saying Do not disturb. Confirmed w/RN mom is sleeping.  Baby's out put is 6 stools, 4 voids, 6 emesis.  Mom has XX large breast. Baby wouldn't even take colostrum.  LC will f/u today, hoping baby is more receptive to BF. Patient Name: Michele Armstrong [id number] Date: 11/25/2016     Maternal Data    Feeding Feeding Type: Breast Fed Length of feed: 5 min  LATCH Score/Interventions Latch:  (Not called to assess LATCH 2300-1100)                    Lactation Tools Discussed/Used     Consult Status      Michele Armstrong, Elta Gu[place] 11/25/2016, 6:29 AM

## 2016-11-25 NOTE — Lactation Note (Signed)
This note was copied from a baby's chart. Lactation Consultation Note  Patient Name: Michele Armstrong [id number] Date: 11/25/2016 Reason for consult: Follow-up assessment;Difficult latch Mom called for assist. Baby fussy trying to latch. Mom had baby positioned on pillow in front of breast. LC had Mom support her breast and using breast compression baby latched without much difficulty and demonstrated good suckling bursts. Mom was very pleased. Did not use nipple shield on left breast. Encouraged Mom to keep working with latch. Praised Mom for her positioning, stressed importance of supporting breasts. Advised if during the night Mom has difficulty with latch call RN for assist. If after 10 minutes of trying to latch baby stays fussy and will not latch then pump and supplement and offer breast next feeding. Also discussed giving baby an appetizer (5-63ml) of EBM/formula if baby fussy with latch. This may help baby to settle down and coordinate suck. Cluster feeding discussed. Encouraged to ask for assist as needed.   Maternal Data    Feeding Feeding Type: Breast Fed Length of feed: 5 min  LATCH Score/Interventions Latch: Repeated attempts needed to sustain latch, nipple held in mouth throughout feeding, stimulation needed to elicit sucking reflex. Intervention(s): Adjust position;Assist with latch;Breast massage;Breast compression  Audible Swallowing: A few with stimulation  Type of Nipple: Everted at rest and after stimulation  Comfort (Breast/Nipple): Soft / non-tender     Hold (Positioning): Assistance needed to correctly position infant at breast and maintain latch.  LATCH Score: 7  Lactation Tools Discussed/Used Tools: Pump Breast pump type: Double-Electric Breast Pump   Consult Status Consult Status: Follow-up Date: 11/26/16 Follow-up type: In-patient    Katrine Coho 11/25/2016, 10:18 PM

## 2016-11-25 NOTE — Lactation Note (Signed)
This note was copied from a baby's chart. Lactation Consultation Note  Patient Name: Michele Armstrong [id number] Date: 11/25/2016 Reason for consult: Follow-up assessment;Difficult latch  Mom called for assistance with feeding.  Mom with very heavy, large and pendulous breasts.  Nipples erect, and areola compressible.  Hand expression for drops of colostrum.   Tried in football position and side lying position, using pillows for support and cloth diaper for breast support.  Mom tends to pulls breast away from baby's mouth to see the latch, but this is pulling breast out of baby's mouth.  Showed Mom how to sandwich breast.  Baby unable to sustain a latch.  Also baby pushing against breast, but is also thrusting tongue and coming off the breast.  Baby has a slightly recessed chin also. Switched to cross cradle hold.  FOB assisting with trying to latch baby by sandwiching breast, and helping calm baby and talk with him while he is feeding.  Tried swaddling baby and baby seemed to calm down.  Initiated a 20 mm nipple shield to help facilitate sustaining the latch.  Baby stayed on for 3 minutes of sucking, no swallows audible.  Then baby was able to latch for 2 minutes without nipple shield, Mom feeling a tug.   Talked about pumping and offering baby her EBM to baby.  Recommended offering baby Alimentum formula if EBM volume not 7-12 ml.  RN to show parents how to pace feed using the bottle.   Mom exhausted.  To call for assistance as needed.     Consult Status Consult Status: Follow-up Date: 11/26/16 Follow-up type: In-patient    Broadus John 11/25/2016, 4:49 PM

## 2016-11-25 NOTE — Discharge Summary (Signed)
Obstetric Discharge Summary Reason for Admission: IOL with pitocin at 41 weeks Prenatal Procedures: ultrasound Intrapartum Procedures: spontaneous vaginal delivery Postpartum Procedures: none Complications-Operative and Postpartum: 1st degree perineal laceration Hemoglobin  Date Value Ref Range Status  11/23/2016 10.8 (L) 12.0 - 15.0 g/dL Final   HCT  Date Value Ref Range Status  11/23/2016 32.0 (L) 36.0 - 46.0 % Final    Physical Exam:  General: alert Lochia: appropriate Uterine Fundus: firm and NT at U-2 DVT Evaluation: No evidence of DVT seen on physical exam.  Discharge Diagnoses: Term Pregnancy-delivered  Discharge Information: Date: 11/25/2016 Activity: pelvic rest Diet: routine Medications: PNV, Ibuprofen and Micronor- start in 2 weeks, same time every day Condition: stable Instructions: refer to practice specific booklet Discharge to: home   Newborn Data: Live born female  Birth Weight: 7 lb 2.5 oz (3246 g) APGAR: 9, 9  Home with mother.  Michele Armstrong 11/25/2016, 6:44 AM

## 2016-11-26 ENCOUNTER — Ambulatory Visit: Payer: Self-pay
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
## 2016-11-26 NOTE — Lactation Note (Signed)
This note was copied from a baby's chart. Lactation Consultation Note  Patient Name: Boy Shelton Furniss [id number] Date: 11/26/2016 Reason for consult: Follow-up assessment   Mother stated that she has been successfully able to latch baby now by holding breast from underneath. Mother demonstrated how she latches baby but baby recently was given pumped breastmilk so he is not hungry at this time. Provided paperwork for [facility] loaner. Faxed WIC pump referral. Reviewed engorgement care and monitoring voids/stools. Mom encouraged to feed baby 8-12 times/24 hours and with feeding cues.  Reminded parents to call if they have further concerns.  Offered OP appt but parents state they will call.    Maternal Data    Feeding Feeding Type: Breast Milk  LATCH Score/Interventions                      Lactation Tools Discussed/Used     Consult Status Consult Status: Complete    Carlye Grippe 11/26/2016, 9:41 AM

## 2016-11-28 ENCOUNTER — Encounter: Payer: Self-pay | Admitting: Family Medicine

## 2016-11-28 ENCOUNTER — Ambulatory Visit (INDEPENDENT_AMBULATORY_CARE_PROVIDER_SITE_OTHER): Payer: Medicaid Other | Admitting: Family Medicine

## 2016-11-28 VITALS — BP 113/74 | HR 116 | Temp 98.7°F | Ht 64.0 in | Wt 201.0 lb

## 2016-11-28 DIAGNOSIS — R300 Dysuria: Secondary | ICD-10-CM | POA: Diagnosis not present

## 2016-11-28 MED ORDER — LIDOCAINE HCL 4 % EX GEL: 1.0000 "application " | Freq: Four times a day (QID) | CUTANEOUS | 3 refills | Status: DC | PRN

## 2016-11-28 NOTE — Progress Notes (Signed)
Patient states that she is crying every time she is going to the restroom. Patient states that vaginal area burns with urination. Patient had first degree tear. Kathrene Alu RNBSN

## 2016-11-28 NOTE — Progress Notes (Signed)
   Subjective:    Patient ID: Michele Armstrong, female    DOB: [birth year], 31 y.o.   MRN: [id number]  HPI Patient seen 4 days pp from SVD with 1st degree laceration with repair. Having vaginal burning when she urinates. Extremely painful - cries when she has to use the bathroom. No fevers, chills, nausea, vomiting. Lochia normal.    Review of Systems     Objective:   Physical Exam  Constitutional: She appears well-developed and well-nourished.  Abdominal: Soft. There is no tenderness. There is no rebound and no guarding.  Genitourinary:    There is no rash, tenderness or lesion on the right labia. There is no rash, tenderness, lesion or injury on the left labia.  Skin: Skin is warm and dry.  Psychiatric: She has a normal mood and affect. Her behavior is normal. Judgment and thought content normal.      Assessment & Plan:  1. Dysuria Patient has history of frequent UTIs. Unlikely, but will send cx per patient request. Lidocaine gel prescribed. Return if worsens. - CULTURE, URINE COMPREHENSIVE

## 2016-11-30 LAB — CULTURE, URINE COMPREHENSIVE

## 2016-12-03 ENCOUNTER — Emergency Department (HOSPITAL_BASED_OUTPATIENT_CLINIC_OR_DEPARTMENT_OTHER)
Admission: EM | Admit: 2016-12-03 | Discharge: 2016-12-03 | Disposition: A | Discharge status: Home / Self Care | Payer: Medicaid Other | Attending: Emergency Medicine | Admitting: Emergency Medicine

## 2016-12-03 ENCOUNTER — Encounter (HOSPITAL_BASED_OUTPATIENT_CLINIC_OR_DEPARTMENT_OTHER): Payer: Self-pay | Admitting: Emergency Medicine

## 2016-12-03 DIAGNOSIS — Z87891 Personal history of nicotine dependence: Secondary | ICD-10-CM | POA: Insufficient documentation

## 2016-12-03 DIAGNOSIS — Z79899 Other long term (current) drug therapy: Secondary | ICD-10-CM | POA: Diagnosis not present

## 2016-12-03 DIAGNOSIS — Z791 Long term (current) use of non-steroidal anti-inflammatories (NSAID): Secondary | ICD-10-CM | POA: Insufficient documentation

## 2016-12-03 DIAGNOSIS — E039 Hypothyroidism, unspecified: Secondary | ICD-10-CM | POA: Insufficient documentation

## 2016-12-03 DIAGNOSIS — R300 Dysuria: Secondary | ICD-10-CM | POA: Diagnosis present

## 2016-12-03 DIAGNOSIS — N3001 Acute cystitis with hematuria: Secondary | ICD-10-CM | POA: Diagnosis not present

## 2016-12-03 LAB — URINALYSIS, ROUTINE W REFLEX MICROSCOPIC
Bilirubin Urine: NEGATIVE
GLUCOSE, UA: NEGATIVE mg/dL
Ketones, ur: NEGATIVE mg/dL
NITRITE: NEGATIVE
PH: 7 (ref 5.0–8.0)
PROTEIN: NEGATIVE mg/dL
Specific Gravity, Urine: 1.011 (ref 1.005–1.030)

## 2016-12-03 LAB — URINALYSIS, MICROSCOPIC (REFLEX)

## 2016-12-03 MED ORDER — CEPHALEXIN 500 MG PO CAPS: 500.0000 mg | ORAL_CAPSULE | Freq: Two times a day (BID) | ORAL | 0 refills | Status: DC

## 2016-12-03 NOTE — ED Notes (Signed)
ED Provider at bedside. 

## 2016-12-03 NOTE — ED Triage Notes (Signed)
Vag itching and d/c. States, " My urethra hurts when I sit down" Denies actual dysuria , had a vag delivery  on 2/22 and had a vag tear .

## 2016-12-03 NOTE — ED Provider Notes (Signed)
[place] DEPT [facility] Provider Note   CSN: [id number] Arrival date & time: 12/03/16  1251  By signing my name below, I, Avnee Patel, attest that this documentation has been prepared under the direction and in the presence of Blanchie Dessert, MD  Electronically Signed: Delton Prairie, ED Scribe. 12/03/16. 3:32 PM.   History   Chief Complaint Chief Complaint  Patient presents with  . Dysuria   The history is provided by the patient. No language interpreter was used.   HPI Comments:  Michele Armstrong is a 31 y.o. female who presents to the Emergency Department complaining of acute onset, moderate vaginal itching and pressurized vaginal pain onset several days. Her symptoms are worse when sitting. She also reports acute onset, intermittent episodes of cramping and shooting abdominal pain which last from 10-15 minutes that began today. Pt reports she gave birth on 11/24/2016 and has a first degree vaginal tear which required 2 stiches. No alleviating factors noted. Pt denies witch hazel use, a allergy to antibiotics or any other associated symptoms. No other complaints noted.   Past Medical History:  Diagnosis Date  . Anxiety   . Arrhythmia    tachycardia  . Depression   . Fatigue   . Headache(784.0)   . Hypothyroid   . PTSD (post-traumatic stress disorder)   . Seizures ([place])    Medication related seizure x 1    Patient Active Problem List   Diagnosis Date Noted  . Constipation during pregnancy 10/07/2016  . Supervision of normal pregnancy 07/18/2016  . PTSD (post-traumatic stress disorder) 08/22/2011  . Depression, major, recurrent ([place]) 08/22/2011  . Hypothyroid 08/22/2011    Past Surgical History:  Procedure Laterality Date  . CHOLECYSTECTOMY    . WISDOM TOOTH EXTRACTION      OB History    Gravida Para Term Preterm AB Living   2 1 1   1 1    SAB TAB Ectopic Multiple Live Births         0 1       Home Medications    Prior to Admission medications   Medication  Sig Start Date End Date Taking? Authorizing Provider  levothyroxine (SYNTHROID, LEVOTHROID) 25 MCG tablet Take 1 tablet (25 mcg total) by mouth daily before breakfast. 09/21/16  Yes Tanna Savoy Stinson, DO  Prenat-FeAsp-Meth-FA-DHA w/o A (PRENATE PIXIE) 10-0.6-0.4-200 MG CAPS Take 1 capsule by mouth daily.   Yes Historical Provider, MD  calcium carbonate (TUMS - DOSED IN MG ELEMENTAL CALCIUM) 500 MG chewable tablet Chew 1-2 tablets by mouth as needed for indigestion or heartburn.    Historical Provider, MD  ibuprofen (ADVIL,MOTRIN) 600 MG tablet Take 1 tablet (600 mg total) by mouth every 6 (six) hours. Patient not taking: Reported on 11/28/2016 11/25/16   Emily Filbert, MD  Lidocaine HCl 4 % GEL Apply 1 application topically 4 (four) times daily as needed. 11/28/16   Truett Mainland, DO  norethindrone (MICRONOR,CAMILA,ERRIN) 0.35 MG tablet Take 1 tablet (0.35 mg total) by mouth daily. Patient not taking: Reported on 11/28/2016 11/25/16   Emily Filbert, MD    Family History Family History  Problem Relation Age of Onset  . Alcohol abuse Father   . Drug abuse Father   . Hypertension Father   . Hypertension Mother   . Diabetes Mother   . Leukemia Maternal Grandmother   . Breast cancer Maternal Aunt     Social History Social History  Substance Use Topics  . Smoking status: Former Smoker  Types: Cigarettes  . Smokeless tobacco: Never Used  . Alcohol use No     Allergies   Morphine and related and Latex   Review of Systems Review of Systems  Gastrointestinal: Positive for abdominal pain.  Genitourinary: Positive for vaginal discharge and vaginal pain.       +vaginal itching   All other systems reviewed and are negative.  Physical Exam Updated Vital Signs BP 135/92 (BP Location: Left Arm)   Pulse 64   Temp 98.5 F (36.9 C) (Oral)   Resp 22   Ht 5\' 4"  (1.626 m)   Wt 199 lb (90.3 kg)   SpO2 100%   BMI 34.16 kg/m   Physical Exam  Constitutional: She is oriented to person, place,  and time. She appears well-developed and well-nourished. No distress.  HENT:  Head: Normocephalic and atraumatic.  Eyes: Conjunctivae are normal.  Cardiovascular: Normal rate.   Pulmonary/Chest: Effort normal.  Abdominal: She exhibits no distension. There is tenderness in the suprapubic area.  Genitourinary: Vaginal discharge found.  Genitourinary Comments: Well healing first degree vaginal tear at 5 o clock. Minimal yellow discharge. No significant swelling or discharge consistent to yeast.  Neurological: She is alert and oriented to person, place, and time.  Skin: Skin is warm and dry.  Psychiatric: She has a normal mood and affect.  Nursing note and vitals reviewed.  ED Treatments / Results  DIAGNOSTIC STUDIES:  Oxygen Saturation is 100% on RA, normal by my interpretation.    COORDINATION OF CARE:  3:14 PM Discussed treatment plan with pt at bedside and pt agreed to plan.  Labs (all labs ordered are listed, but only abnormal results are displayed) Labs Reviewed  URINALYSIS, ROUTINE W REFLEX MICROSCOPIC - Abnormal; Notable for the following:       Result Value   APPearance CLOUDY (*)    Hgb urine dipstick LARGE (*)    Leukocytes, UA LARGE (*)    All other components within normal limits  URINALYSIS, MICROSCOPIC (REFLEX) - Abnormal; Notable for the following:    Bacteria, UA MANY (*)    Squamous Epithelial / LPF 6-30 (*)    All other components within normal limits    EKG  EKG Interpretation None       Radiology No results found.  Procedures Procedures (including critical care time)  Medications Ordered in ED Medications - No data to display   Initial Impression / Assessment and Plan / ED Course  I have reviewed the triage vital signs and the nursing notes.  Pertinent labs & imaging results that were available during my care of the patient were reviewed by me and considered in my medical decision making (see chart for details).     Pt with uncomplicated  UTI today.  Will give kelfex.  No sx suggestive pyelo and low suspicion for vaginal source.  Final Clinical Impressions(s) / ED Diagnoses   Final diagnoses:  Acute cystitis with hematuria    New Prescriptions New Prescriptions   CEPHALEXIN (KEFLEX) 500 MG CAPSULE    Take 1 capsule (500 mg total) by mouth 2 (two) times daily.   I personally performed the services described in this documentation, which was scribed in my presence.  The recorded information has been reviewed and considered.     Blanchie Dessert, MD 12/03/16 1535

## 2016-12-23 ENCOUNTER — Ambulatory Visit (INDEPENDENT_AMBULATORY_CARE_PROVIDER_SITE_OTHER): Payer: Medicaid Other | Admitting: Family Medicine

## 2016-12-23 ENCOUNTER — Encounter: Payer: Self-pay | Admitting: Family Medicine

## 2016-12-23 VITALS — BP 103/63 | HR 78 | Ht 64.0 in | Wt 200.0 lb

## 2016-12-23 DIAGNOSIS — K649 Unspecified hemorrhoids: Secondary | ICD-10-CM

## 2016-12-23 MED ORDER — PRAMOXINE HCL 1 % RE FOAM: 1.0000 "application " | Freq: Three times a day (TID) | RECTAL | 2 refills | Status: DC | PRN

## 2016-12-23 NOTE — Patient Instructions (Signed)

## 2016-12-23 NOTE — Progress Notes (Signed)
   Subjective:    Patient ID: Michele Armstrong, female    DOB: [birth year], 31 y.o.   MRN: [id number]  HPI Patient seen for Hemorrhoid. She had hemorrhoids after delivery, but has started to have some prolapsing hemorrhoids when having a bowel movement. They take a few moments to return. She's been using the Hytrin is on suppositories, which are wobbly helpful.   Review of Systems     Objective:   Physical Exam  Constitutional: She appears well-developed and well-nourished.  Genitourinary:    There is no rash, tenderness or lesion on the right labia. There is no rash, tenderness or lesion on the left labia.        Assessment & Plan:  1. Hemorrhoids, unspecified hemorrhoid type Proctofoam in addition to Anusol. Sitz baths. No thrombosed hemorrhoids seen.

## 2017-01-02 ENCOUNTER — Telehealth: Payer: Self-pay

## 2017-01-02 NOTE — Telephone Encounter (Signed)
Patient pharmacy calling stating that Proctofoam is not covered by [facility] and will cost patient 52.00. Is there another replacement medication? Will route to provider. Kathrene Alu RNBSN

## 2017-01-05 ENCOUNTER — Ambulatory Visit: Payer: Self-pay | Admitting: Family Medicine

## 2017-01-06 ENCOUNTER — Ambulatory Visit (INDEPENDENT_AMBULATORY_CARE_PROVIDER_SITE_OTHER): Payer: Medicaid Other | Admitting: Family Medicine

## 2017-01-06 VITALS — Ht 64.0 in | Wt 204.0 lb

## 2017-01-06 DIAGNOSIS — Z3202 Encounter for pregnancy test, result negative: Secondary | ICD-10-CM | POA: Diagnosis not present

## 2017-01-06 DIAGNOSIS — K649 Unspecified hemorrhoids: Secondary | ICD-10-CM

## 2017-01-06 DIAGNOSIS — Z01812 Encounter for preprocedural laboratory examination: Secondary | ICD-10-CM

## 2017-01-06 DIAGNOSIS — Z30430 Encounter for insertion of intrauterine contraceptive device: Secondary | ICD-10-CM

## 2017-01-06 LAB — POCT URINE PREGNANCY: Preg Test, Ur: NEGATIVE

## 2017-01-06 MED ORDER — LEVONORGESTREL 18.6 MCG/DAY IU IUD
INTRAUTERINE_SYSTEM | Freq: Once | INTRAUTERINE | Status: AC
  Administered 2017-01-06: 11:00:00 via INTRAUTERINE

## 2017-01-06 MED ORDER — HYDROCORTISONE ACETATE 25 MG RE SUPP: 25.0000 mg | Freq: Two times a day (BID) | RECTAL | 0 refills | Status: DC

## 2017-01-06 NOTE — Progress Notes (Signed)
Post Partum Exam  Michele Armstrong is a 31 y.o. G66P1011 female who presents for a postpartum visit. She is 6 weeks postpartum following a spontaneous vaginal delivery. I have fully reviewed the prenatal and intrapartum course. The delivery was at 41.1  gestational weeks.  Anesthesia: epidural. Postpartum course has been has be complicated by hemorrhoids and breast feeding pain . Baby's course has been uneventful. Baby is feeding by breast. Bleeding moderate lochia. Bowel function is normal. Bladder function is normal. Patient is not sexually active. Contraception method is IUD. Postpartum depression screening:score 5.  Had tender breast that were really engorged. Saw Dr Jacqulyn Cane - prescribed propranolol and trazodone. Still having hemorrhoids - very uncomfortable. Not able to afford proctofoam. Taking fiber supplement.  The following portions of the patient's history were reviewed and updated as appropriate: allergies, current medications, past family history, past medical history, past social history, past surgical history and problem list.  Review of Systems Pertinent items noted in HPI and remainder of comprehensive ROS otherwise negative.    Objective:  Height 5\' 4"  (1.626 m), weight 204 lb (92.5 kg), last menstrual period 01/01/2017, currently breastfeeding.  General:  alert, cooperative and no distress  Lungs: clear to auscultation bilaterally  Heart:  regular rate and rhythm, S1, S2 normal, no murmur, click, rub or gallop  Abdomen: soft, non-tender; bowel sounds normal; no masses,  no organomegaly   Vulva:  normal  Vagina: normal vagina, no discharge, exudate, lesion, or erythema  Cervix:  multiparous appearance and no cervical motion tenderness  Rectal Exam: external hemorrhoids        Assessment:    Normal postpartum exam. Hemorrhoids.   IUD Procedure Note Patient identified, informed consent performed, signed copy in chart, time out was performed.  Urine pregnancy test  negative.  Speculum placed in the vagina.  Cervix visualized.  Cleaned with Betadine x 2.  Grasped anteriorly with a single tooth tenaculum.  Uterus sounded to 9 cm.  Liletta  IUD placed per manufacturer's recommendations.  Strings trimmed to 3 cm. Tenaculum was removed, good hemostasis noted.  Patient tolerated procedure well.   Patient given post procedure instructions and Liletta care card with expiration date.  Patient is asked to check IUD strings periodically and follow up in 4-6 weeks for IUD check.   Plan:   1. Contraception: IUD - placed today 2. Refer to GI for hemorrhoids. Will restart hydrocortisone suppositories. 3. Follow up in: 1 month or as needed.

## 2017-01-10 DIAGNOSIS — N62 Hypertrophy of breast: Secondary | ICD-10-CM | POA: Insufficient documentation

## 2017-01-10 DIAGNOSIS — O9279 Other disorders of lactation: Secondary | ICD-10-CM | POA: Insufficient documentation

## 2017-01-10 DIAGNOSIS — R52 Pain, unspecified: Secondary | ICD-10-CM | POA: Insufficient documentation

## 2017-02-03 ENCOUNTER — Encounter: Payer: Self-pay | Admitting: Obstetrics & Gynecology

## 2017-02-03 ENCOUNTER — Other Ambulatory Visit (HOSPITAL_COMMUNITY)
Admission: RE | Admit: 2017-02-03 | Discharge: 2017-02-03 | Disposition: A | Discharge status: Home / Self Care | Payer: Medicaid Other | Source: Ambulatory Visit | Attending: Obstetrics & Gynecology | Admitting: Obstetrics & Gynecology

## 2017-02-03 ENCOUNTER — Ambulatory Visit (INDEPENDENT_AMBULATORY_CARE_PROVIDER_SITE_OTHER): Payer: Medicaid Other | Admitting: Obstetrics & Gynecology

## 2017-02-03 VITALS — BP 97/55 | HR 66 | Ht 64.0 in | Wt 210.0 lb

## 2017-02-03 DIAGNOSIS — Z01419 Encounter for gynecological examination (general) (routine) without abnormal findings: Secondary | ICD-10-CM

## 2017-02-03 DIAGNOSIS — Z124 Encounter for screening for malignant neoplasm of cervix: Secondary | ICD-10-CM

## 2017-02-03 DIAGNOSIS — Z0000 Encounter for general adult medical examination without abnormal findings: Secondary | ICD-10-CM

## 2017-02-03 DIAGNOSIS — Z1151 Encounter for screening for human papillomavirus (HPV): Secondary | ICD-10-CM | POA: Diagnosis not present

## 2017-02-03 MED ORDER — HYDROCORTISONE ACETATE 25 MG RE SUPP: 25.0000 mg | Freq: Two times a day (BID) | RECTAL | 2 refills | Status: DC

## 2017-02-03 NOTE — Progress Notes (Signed)
   Subjective:    Patient ID: Michele Armstrong, female    DOB: 14-Jul-1986, 31 y.o.   MRN: [id number]  HPI 31 yo P1 here for a pap and string check. She has had some small to moderate amount of bleeding since the IUD was placed. She was worried that she could not use a tampon.  She would like a refill of anusol hemorrhoid supps. They are getting smaller.  Review of Systems     Objective:   Physical Exam [id number] Breathing, conversing, and ambulating normally Very tiny external hemorrhoids IUD strings seen       Assessment & Plan:  Preventative care- pap smear done, rec referral to [place] [facility]- reassurance given regarding Liletta and tampons/irregular bleeding

## 2017-02-03 NOTE — Progress Notes (Signed)
Has had vomiting 1-2x a week since IUD insertion.  Light bleeding x 2 weeks, usually amount for panty liner. Only small amount of cramping.

## 2017-02-07 LAB — CYTOLOGY - PAP
DIAGNOSIS: NEGATIVE
HPV: NOT DETECTED

## 2017-03-29 ENCOUNTER — Encounter: Payer: Self-pay | Admitting: Gastroenterology

## 2017-03-31 ENCOUNTER — Ambulatory Visit: Payer: Self-pay | Admitting: Family Medicine

## 2017-04-06 ENCOUNTER — Ambulatory Visit: Payer: Self-pay | Admitting: Obstetrics & Gynecology

## 2017-04-13 ENCOUNTER — Ambulatory Visit (INDEPENDENT_AMBULATORY_CARE_PROVIDER_SITE_OTHER): Payer: Medicaid Other | Admitting: Family Medicine

## 2017-04-13 ENCOUNTER — Encounter: Payer: Self-pay | Admitting: Family Medicine

## 2017-04-13 VITALS — BP 120/57 | HR 76 | Ht 64.0 in | Wt 205.0 lb

## 2017-04-13 DIAGNOSIS — Z30431 Encounter for routine checking of intrauterine contraceptive device: Secondary | ICD-10-CM

## 2017-04-13 NOTE — Progress Notes (Signed)
Patient complaining bad cramping with IUD for about a month. Kathrene Alu RNBSN

## 2017-04-13 NOTE — Progress Notes (Signed)
   Subjective:    Patient ID: Michele Armstrong, female    DOB: 04-11-1986, 31 y.o.   MRN: [id number]  HPI  Patient seen for IUD check. Patient has been having intermittent sharp cramping since IUD placed on 01/06/17. She had a little bit of bleeding after placement, then no bleeding until she had a very heavy period with cramping a week ago. She wanted to make sure that the IUD was still placed  Review of Systems     Objective:   Physical Exam  Constitutional: She appears well-developed.  HENT:  Head: Normocephalic and atraumatic.  Abdominal: Soft. She exhibits no distension. There is no tenderness. There is no rebound.  Genitourinary: There is no rash, tenderness, lesion or injury on the right labia. There is no rash, tenderness, lesion or injury on the left labia. There is bleeding in the vagina. No tenderness in the vagina. No foreign body in the vagina. No vaginal discharge found.    Skin: Skin is warm and dry.  Psychiatric: She has a normal mood and affect. Her behavior is normal. Judgment and thought content normal.      Assessment & Plan:  1. Encounter for routine checking of intrauterine contraceptive device (IUD) No evidence that IUD has moved. NSAIDs for cramping. Reassured pt - cramping and bleeding can occur in the first few months of the iud (up to 6 months). She will follow up in 3-4 months if still having cramping.

## 2017-04-22 ENCOUNTER — Emergency Department (HOSPITAL_BASED_OUTPATIENT_CLINIC_OR_DEPARTMENT_OTHER)
Admission: EM | Admit: 2017-04-22 | Discharge: 2017-04-22 | Disposition: A | Discharge status: Home / Self Care | Payer: Medicaid Other | Attending: Emergency Medicine | Admitting: Emergency Medicine

## 2017-04-22 ENCOUNTER — Encounter (HOSPITAL_BASED_OUTPATIENT_CLINIC_OR_DEPARTMENT_OTHER): Payer: Self-pay

## 2017-04-22 DIAGNOSIS — N390 Urinary tract infection, site not specified: Secondary | ICD-10-CM

## 2017-04-22 DIAGNOSIS — Z87891 Personal history of nicotine dependence: Secondary | ICD-10-CM | POA: Diagnosis not present

## 2017-04-22 DIAGNOSIS — Z79899 Other long term (current) drug therapy: Secondary | ICD-10-CM | POA: Insufficient documentation

## 2017-04-22 DIAGNOSIS — Z91040 Latex allergy status: Secondary | ICD-10-CM | POA: Diagnosis not present

## 2017-04-22 DIAGNOSIS — R350 Frequency of micturition: Secondary | ICD-10-CM

## 2017-04-22 DIAGNOSIS — E039 Hypothyroidism, unspecified: Secondary | ICD-10-CM | POA: Insufficient documentation

## 2017-04-22 LAB — URINALYSIS, ROUTINE W REFLEX MICROSCOPIC
BILIRUBIN URINE: NEGATIVE
Glucose, UA: NEGATIVE mg/dL
HGB URINE DIPSTICK: NEGATIVE
Ketones, ur: NEGATIVE mg/dL
Leukocytes, UA: NEGATIVE
Nitrite: NEGATIVE
PH: 7 (ref 5.0–8.0)
Protein, ur: 30 mg/dL — AB
SPECIFIC GRAVITY, URINE: 1.027 (ref 1.005–1.030)

## 2017-04-22 LAB — PREGNANCY, URINE: Preg Test, Ur: NEGATIVE

## 2017-04-22 LAB — URINALYSIS, MICROSCOPIC (REFLEX)

## 2017-04-22 MED ORDER — IBUPROFEN 600 MG PO TABS: 600.0000 mg | ORAL_TABLET | Freq: Four times a day (QID) | ORAL | 0 refills | Status: DC | PRN

## 2017-04-22 MED ORDER — CEPHALEXIN 500 MG PO CAPS: 500.0000 mg | ORAL_CAPSULE | Freq: Four times a day (QID) | ORAL | 0 refills | Status: AC

## 2017-04-22 NOTE — ED Provider Notes (Signed)
[place] DEPT [facility] Provider Note   CSN: [id number] Arrival date & time: 04/22/17  1051     History   Chief Complaint Chief Complaint  Patient presents with  . Urinary Frequency    HPI Michele Armstrong is a 31 y.o. female.  HPI  31 yo F with PMHx as below including recurrent chemical and bacterial cystitis here with dysuria. Pt states she used a new soap several days ago, and has since had persistent dysuria and urinary frequency. She has also had mild nausea. No fevers or flank pain. No vaginal bleeding, itching, or discharge. She has a h/o similar episodes and frequent UTIs, several of which are c/b pyelo. She reports no dyspareunia. No alleviating or aggravating factors.  Past Medical History:  Diagnosis Date  . Anxiety   . Arrhythmia    tachycardia  . Depression   . Fatigue   . Headache(784.0)   . Hypothyroid   . PTSD (post-traumatic stress disorder)   . Seizures ([place])    Medication related seizure x 1    Patient Active Problem List   Diagnosis Date Noted  . PTSD (post-traumatic stress disorder) 08/22/2011  . Depression, major, recurrent ([place]) 08/22/2011  . Hypothyroid 08/22/2011    Past Surgical History:  Procedure Laterality Date  . CHOLECYSTECTOMY    . WISDOM TOOTH EXTRACTION      OB History    Gravida Para Term Preterm AB Living   2 1 1   1 1    SAB TAB Ectopic Multiple Live Births         0 1       Home Medications    Prior to Admission medications   Medication Sig Start Date End Date Taking? Authorizing Provider  calcium carbonate (TUMS - DOSED IN MG ELEMENTAL CALCIUM) 500 MG chewable tablet Chew 1-2 tablets by mouth as needed for indigestion or heartburn.    [provider]  cephALEXin (KEFLEX) 500 MG capsule Take 1 capsule (500 mg total) by mouth 4 (four) times daily. 04/22/17 04/27/17  Duffy Bruce, MD  hydrocortisone (ANUSOL-HC) 25 MG suppository Place 1 suppository (25 mg total) rectally 2 (two) times daily. Patient not  taking: Reported on 04/13/2017 02/03/17   Emily Filbert, MD  ibuprofen (ADVIL,MOTRIN) 600 MG tablet Take 1 tablet (600 mg total) by mouth every 6 (six) hours as needed for moderate pain. 04/22/17   Duffy Bruce, MD  levonorgestrel (MIRENA) 20 MCG/24HR IUD 1 each by Intrauterine route once.    [provider]  levothyroxine (SYNTHROID, LEVOTHROID) 25 MCG tablet Take 1 tablet (25 mcg total) by mouth daily before breakfast. Patient not taking: Reported on 04/13/2017 09/21/16   Truett Mainland, DO  Lidocaine HCl 4 % GEL Apply 1 application topically 4 (four) times daily as needed. 11/28/16   Truett Mainland, DO  pramoxine (PROCTOFOAM) 1 % foam Place 1 application rectally 3 (three) times daily as needed for itching. Patient not taking: Reported on 04/13/2017 12/23/16   Truett Mainland, DO  Prenat-FeAsp-Meth-FA-DHA w/o A (PRENATE PIXIE) 10-0.6-0.4-200 MG CAPS Take 1 capsule by mouth daily.    [provider]  propranolol (INDERAL) 10 MG tablet Take 10 mg by mouth. 01/05/17 02/04/17  [provider]  propranolol (INDERAL) 10 MG tablet Take 10 mg by mouth. 01/05/17   [provider]  traZODone (DESYREL) 50 MG tablet Take 25 mg by mouth. 01/05/17   [provider]    Family History Family History  Problem Relation Age of Onset  .  Alcohol abuse Father   . Drug abuse Father   . Hypertension Father   . Hypertension Mother   . Diabetes Mother   . Leukemia Maternal Grandmother   . Breast cancer Maternal Aunt     Social History Social History  Substance Use Topics  . Smoking status: Former Smoker    Types: Cigarettes  . Smokeless tobacco: Never Used  . Alcohol use 1.2 oz/week    2 Glasses of wine per week     Allergies   Morphine and related and Latex   Review of Systems Review of Systems  Constitutional: Negative for chills and fever.  HENT: Negative for congestion, rhinorrhea and sore throat.   Eyes: Negative for visual disturbance.  Respiratory:  Negative for cough, shortness of breath and wheezing.   Cardiovascular: Negative for chest pain and leg swelling.  Gastrointestinal: Negative for abdominal pain, diarrhea, nausea and vomiting.  Genitourinary: Positive for dysuria and frequency. Negative for flank pain, vaginal bleeding and vaginal discharge.  Musculoskeletal: Negative for neck pain.  Skin: Negative for rash.  Allergic/Immunologic: Negative for immunocompromised state.  Neurological: Negative for syncope and headaches.  Hematological: Does not bruise/bleed easily.  All other systems reviewed and are negative.    Physical Exam Updated Vital Signs BP 107/83 (BP Location: Right Arm)   Pulse 90   Temp 98.4 F (36.9 C) (Oral)   Resp 16   Ht 5\' 4"  (1.626 m)   Wt 93 kg (205 lb)   LMP 04/10/2017   SpO2 100%   BMI 35.19 kg/m   Physical Exam  Constitutional: She is oriented to person, place, and time. She appears well-developed and well-nourished. No distress.  HENT:  Head: Normocephalic and atraumatic.  Eyes: Conjunctivae are normal.  Neck: Neck supple.  Cardiovascular: Normal rate, regular rhythm and normal heart sounds.  Exam reveals no friction rub.   No murmur heard. Pulmonary/Chest: Effort normal and breath sounds normal. No respiratory distress. She has no wheezes. She has no rales.  Abdominal: Soft. Bowel sounds are normal. She exhibits no distension. There is tenderness (mild, suprapubic). There is no rebound and no guarding.  Musculoskeletal: She exhibits no edema.  Neurological: She is alert and oriented to person, place, and time. She exhibits normal muscle tone.  Skin: Skin is warm. Capillary refill takes less than 2 seconds.  Psychiatric: She has a normal mood and affect.  Nursing note and vitals reviewed.    ED Treatments / Results  Labs (all labs ordered are listed, but only abnormal results are displayed) Labs Reviewed  URINALYSIS, ROUTINE W REFLEX MICROSCOPIC - Abnormal; Notable for the  following:       Result Value   Color, Urine AMBER (*)    Protein, ur 30 (*)    All other components within normal limits  URINALYSIS, MICROSCOPIC (REFLEX) - Abnormal; Notable for the following:    Bacteria, UA RARE (*)    Squamous Epithelial / LPF 6-30 (*)    All other components within normal limits  PREGNANCY, URINE    EKG  EKG Interpretation None       Radiology No results found.  Procedures Procedures (including critical care time)  Medications Ordered in ED Medications - No data to display   Initial Impression / Assessment and Plan / ED Course  I have reviewed the triage vital signs and the nursing notes.  Pertinent labs & imaging results that were available during my care of the patient were reviewed by me and considered in my  medical decision making (see chart for details).     31 yo F with PMHx as above here with urinary frequency and urgency. While I suspect this is 2/2 chemical urethritis versus IC given her chronic issues, she does have some bacteria in her urine, is adamant that her sx will turn into a UTI. No signs of pyel or sepsis. No vaginal discharge or s/s to suggest PID. Will give her Rx for Keflex if sx persist, advise NSAIDs, and outpt follow-up.  Final Clinical Impressions(s) / ED Diagnoses   Final diagnoses:  Lower urinary tract infectious disease  Urinary frequency    New Prescriptions Discharge Medication List as of 04/22/2017 12:40 PM    START taking these medications   Details  cephALEXin (KEFLEX) 500 MG capsule Take 1 capsule (500 mg total) by mouth 4 (four) times daily., Starting Sat 04/22/2017, Until Thu 04/27/2017, Print         Duffy Bruce, MD 04/22/17 2047

## 2017-04-22 NOTE — ED Triage Notes (Signed)
Pt c/o of frequency and burning with urination that started this AM.

## 2017-04-22 NOTE — Discharge Instructions (Signed)
-   I have looked into Azo (pyridium) - do NOT take this while breastfeeding - Instead, take the antibiotic as Ibuprofen as needed - Your pain should improve in 2-3 days

## 2017-05-16 ENCOUNTER — Encounter: Payer: Self-pay | Admitting: Gastroenterology

## 2017-05-16 ENCOUNTER — Ambulatory Visit (INDEPENDENT_AMBULATORY_CARE_PROVIDER_SITE_OTHER): Payer: Medicaid Other | Admitting: Gastroenterology

## 2017-05-16 VITALS — BP 96/64 | HR 80 | Ht 64.0 in | Wt 208.8 lb

## 2017-05-16 DIAGNOSIS — K649 Unspecified hemorrhoids: Secondary | ICD-10-CM

## 2017-05-16 DIAGNOSIS — K625 Hemorrhage of anus and rectum: Secondary | ICD-10-CM

## 2017-05-16 NOTE — Progress Notes (Signed)
HPI: This is a  very pleasant 31-year-old woman  who was referred to me by Stinson, Jacob J, DO  to evaluate  rectal bleeding, hemorrhoids .    Chief complaint is rectal bleeding, hemorrhoids  Late February delivery. During pregnancy hemorrhoids were quite bothersome for her. Since her delivery she feels the external components of her hemorrhoids have resolved however she still feels intermittent protruding of a large soft internal  Hemorrhoid and she will have itching, burning after a BM for half an hour to an hour. This will often bleed.  They can bleed intermittnently.  Suppositories helped temporarily only.  Issues with burning itching after  every BM.  Normally has BM 2 times per day. Pretty easy to get out.  Only rare pushing and straining to move her bowels  Overall stable weight.       Review of systems: Pertinent positive and negative review of systems were noted in the above HPI section. All other review negative.   Past Medical History:  Diagnosis Date  . Anxiety   . Arrhythmia    tachycardia  . Depression   . Fatigue   . Headache(784.0)   . Hypothyroid   . PTSD (post-traumatic stress disorder)   . Seizures (HCC)    Medication related seizure x 1    Past Surgical History:  Procedure Laterality Date  . CHOLECYSTECTOMY    . WISDOM TOOTH EXTRACTION      Current Outpatient Prescriptions  Medication Sig Dispense Refill  . calcium carbonate (TUMS - DOSED IN MG ELEMENTAL CALCIUM) 500 MG chewable tablet Chew 1-2 tablets by mouth as needed for indigestion or heartburn.    . ibuprofen (ADVIL,MOTRIN) 600 MG tablet Take 1 tablet (600 mg total) by mouth every 6 (six) hours as needed for moderate pain. 20 tablet 0  . levonorgestrel (MIRENA) 20 MCG/24HR IUD 1 each by Intrauterine route once.    . levothyroxine (SYNTHROID, LEVOTHROID) 25 MCG tablet Take 1 tablet (25 mcg total) by mouth daily before breakfast. 30 tablet 3  . pramoxine (PROCTOFOAM) 1 % foam Place 1  application rectally 3 (three) times daily as needed for itching. 15 g 2  . Prenat-FeAsp-Meth-FA-DHA w/o A (PRENATE PIXIE) 10-0.6-0.4-200 MG CAPS Take 1 capsule by mouth daily.     No current facility-administered medications for this visit.     Allergies as of 05/16/2017 - Review Complete 05/16/2017  Allergen Reaction Noted  . Morphine and related Nausea And Vomiting 12/21/2014  . Latex Rash 11/23/2016    Family History  Problem Relation Age of Onset  . Alcohol abuse Father   . Drug abuse Father   . Hypertension Father   . Hypertension Mother   . Diabetes Mother   . Leukemia Maternal Grandmother   . Breast cancer Maternal Aunt     Social History   Social History  . Marital status: Married    Spouse name: N/A  . Number of children: N/A  . Years of education: N/A   Occupational History  . homemaker    Social History Main Topics  . Smoking status: Former Smoker    Types: Cigarettes  . Smokeless tobacco: Never Used  . Alcohol use 1.2 oz/week    2 Glasses of wine per week  . Drug use: No     Comment: states "to help with anxiety"  . Sexual activity: Yes    Partners: Male   Other Topics Concern  . Not on file   Social History Narrative  . No narrative   on file     Physical Exam: BP 96/64   Pulse 80   Ht 5' 4" (1.626 m)   Wt 208 lb 12.8 oz (94.7 kg)   BMI 35.84 kg/m  Constitutional: generally well-appearing Psychiatric: alert and oriented x3 Eyes: extraocular movements intact Mouth: oral pharynx moist, no lesions Neck: supple no lymphadenopathy Cardiovascular: heart regular rate and rhythm Lungs: clear to auscultation bilaterally Abdomen: soft, nontender, nondistended, no obvious ascites, no peritoneal signs, normal bowel sounds Extremities: no lower extremity edema bilaterally Skin: no lesions on visible extremities Rectal examination with female assistant in the room: Small deflated anterior external anal hemorrhoid tissue. No obvious anal fissures.  Digital rectal exam revealed soft distal rectal tissue that felt like swollen internal hemorrhoids. There was no tenderness. No mass lesions. Stool was brown and not checked for Hemoccult.  Assessment and plan: 31 y.o. female with  hemorrhoids, intermittent rectal bleeding  The external anterior hemorrhoid seems to have resolved. There is also deflated tissue there now. It does feel like she has internal anal hemorrhoids especially posteriorly 2 digital rectal exam. No other obvious pathology on DRE. I recommended flexible sigmoidoscopy to exclude other causes of rectal bleeding such as neoplasm which I think is unlikely. If that is indeed negative then I will refer her to one of my partners to consider hemorrhoidal banding. In the meantime we'll try to bulk her stools up a bit to see if that helps her symptomatically.    Please see the "Patient Instructions" section for addition details about the plan.   Daniel Jacobs, MD [facility] 05/16/2017, 9:16 AM  Cc: Stinson, Jacob J, DO  

## 2017-05-16 NOTE — Patient Instructions (Addendum)
Please start taking citrucel (orange flavored) powder fiber supplement.  This may cause some bloating at first but that usually goes away. Begin with a small spoonful and work your way up to a large, heaping spoonful daily over a week.  Flex sigmoidoscopy at [facility] on 06/01/17.  If no other issues are noted during the flexible sigmoidoscopy then will arrange for your to meet Dr. Carlean Purl for hemorrhoidal banding procedure.  Normal BMI (Body Mass Index- based on height and weight) is between 19 and 25. Your BMI today is Body mass index is 35.84 kg/m. Marland Kitchen Please consider follow up  regarding your BMI with your Primary Care Provider.

## 2017-06-01 ENCOUNTER — Telehealth: Payer: Self-pay

## 2017-06-01 ENCOUNTER — Encounter (HOSPITAL_COMMUNITY)
Admission: RE | Disposition: A | Discharge status: Home / Self Care | Payer: Self-pay | Source: Ambulatory Visit | Attending: Gastroenterology

## 2017-06-01 ENCOUNTER — Ambulatory Visit (HOSPITAL_COMMUNITY)
Admission: RE | Admit: 2017-06-01 | Discharge: 2017-06-01 | Disposition: A | Discharge status: Home / Self Care | Payer: Medicaid Other | Source: Ambulatory Visit | Attending: Gastroenterology | Admitting: Gastroenterology

## 2017-06-01 ENCOUNTER — Encounter (HOSPITAL_COMMUNITY): Payer: Self-pay | Admitting: Gastroenterology

## 2017-06-01 DIAGNOSIS — Z8249 Family history of ischemic heart disease and other diseases of the circulatory system: Secondary | ICD-10-CM | POA: Diagnosis not present

## 2017-06-01 DIAGNOSIS — Z833 Family history of diabetes mellitus: Secondary | ICD-10-CM | POA: Insufficient documentation

## 2017-06-01 DIAGNOSIS — K625 Hemorrhage of anus and rectum: Secondary | ICD-10-CM

## 2017-06-01 DIAGNOSIS — F4310 Post-traumatic stress disorder, unspecified: Secondary | ICD-10-CM | POA: Diagnosis not present

## 2017-06-01 DIAGNOSIS — K644 Residual hemorrhoidal skin tags: Secondary | ICD-10-CM | POA: Insufficient documentation

## 2017-06-01 DIAGNOSIS — Z811 Family history of alcohol abuse and dependence: Secondary | ICD-10-CM | POA: Diagnosis not present

## 2017-06-01 DIAGNOSIS — Z87891 Personal history of nicotine dependence: Secondary | ICD-10-CM | POA: Diagnosis not present

## 2017-06-01 DIAGNOSIS — Z806 Family history of leukemia: Secondary | ICD-10-CM | POA: Diagnosis not present

## 2017-06-01 DIAGNOSIS — Z813 Family history of other psychoactive substance abuse and dependence: Secondary | ICD-10-CM | POA: Diagnosis not present

## 2017-06-01 DIAGNOSIS — Z975 Presence of (intrauterine) contraceptive device: Secondary | ICD-10-CM | POA: Diagnosis not present

## 2017-06-01 DIAGNOSIS — Z9889 Other specified postprocedural states: Secondary | ICD-10-CM | POA: Diagnosis not present

## 2017-06-01 DIAGNOSIS — E039 Hypothyroidism, unspecified: Secondary | ICD-10-CM | POA: Diagnosis not present

## 2017-06-01 DIAGNOSIS — Z9049 Acquired absence of other specified parts of digestive tract: Secondary | ICD-10-CM | POA: Diagnosis not present

## 2017-06-01 DIAGNOSIS — Z803 Family history of malignant neoplasm of breast: Secondary | ICD-10-CM | POA: Diagnosis not present

## 2017-06-01 DIAGNOSIS — K921 Melena: Secondary | ICD-10-CM

## 2017-06-01 DIAGNOSIS — K648 Other hemorrhoids: Secondary | ICD-10-CM | POA: Diagnosis not present

## 2017-06-01 DIAGNOSIS — K649 Unspecified hemorrhoids: Secondary | ICD-10-CM

## 2017-06-01 HISTORY — PX: FLEXIBLE SIGMOIDOSCOPY: SHX5431

## 2017-06-01 SURGERY — SIGMOIDOSCOPY, FLEXIBLE
Anesthesia: Moderate Sedation

## 2017-06-01 MED ORDER — MIDAZOLAM HCL 5 MG/ML IJ SOLN
INTRAMUSCULAR | Status: AC
  Filled 2017-06-01: qty 2

## 2017-06-01 MED ORDER — FENTANYL CITRATE (PF) 100 MCG/2ML IJ SOLN
INTRAMUSCULAR | Status: DC | PRN
  Administered 2017-06-01 (×2): 25 ug via INTRAVENOUS

## 2017-06-01 MED ORDER — DIPHENHYDRAMINE HCL 50 MG/ML IJ SOLN
INTRAMUSCULAR | Status: AC
  Filled 2017-06-01: qty 1

## 2017-06-01 MED ORDER — SODIUM CHLORIDE 0.9 % IV SOLN: INTRAVENOUS | Status: DC

## 2017-06-01 MED ORDER — MIDAZOLAM HCL 10 MG/2ML IJ SOLN
INTRAMUSCULAR | Status: DC | PRN
  Administered 2017-06-01: 2 mg via INTRAVENOUS
  Administered 2017-06-01: 1 mg via INTRAVENOUS
  Administered 2017-06-01: 2 mg via INTRAVENOUS

## 2017-06-01 MED ORDER — FENTANYL CITRATE (PF) 100 MCG/2ML IJ SOLN
INTRAMUSCULAR | Status: AC
  Filled 2017-06-01: qty 2

## 2017-06-01 NOTE — Discharge Instructions (Signed)

## 2017-06-01 NOTE — Telephone Encounter (Signed)
-----   Message from Milus Banister, MD sent at 06/01/2017  3:07 PM EDT ----- Can you arrange for her to meet with first available MD for hemorroidal banding.  Thanks

## 2017-06-01 NOTE — Interval H&P Note (Signed)
History and Physical Interval Note:  06/01/2017 1:48 PM  Michele Armstrong  has presented today for surgery, with the diagnosis of hemorroids   The various methods of treatment have been discussed with the patient and family. After consideration of risks, benefits and other options for treatment, the patient has consented to  Procedure(s): FLEXIBLE SIGMOIDOSCOPY (N/A) as a surgical intervention .  The patient's history has been reviewed, patient examined, no change in status, stable for surgery.  I have reviewed the patient's chart and labs.  Questions were answered to the patient's satisfaction.     Milus Banister

## 2017-06-01 NOTE — Telephone Encounter (Signed)
Banding appt with Carlean Purl on 06/06/17 at 3:15 pm

## 2017-06-01 NOTE — Op Note (Signed)
[facility] Patient Name: Michele Armstrong Procedure Date: 06/01/2017 MRN: [id number] Attending MD: Milus Banister , MD Date of Birth: 1985-12-30 CSN: [id number] Age: 31 Admit Type: Outpatient Procedure:                Flexible Sigmoidoscopy Indications:              Hematochezia Providers:                Milus Banister, MD, Elmer Ramp. Hinson, RN, [employer], Janie Billups, Merchant navy officer Referring MD:              Medicines:                Fentanyl 50 micrograms IV, Midazolam 5 mg IV Complications:            No immediate complications. Estimated blood loss:                            None. Estimated Blood Loss:     Estimated blood loss: none. Procedure:                Pre-Anesthesia Assessment:                           - Prior to the procedure, a History and Physical                            was performed, and patient medications and                            allergies were reviewed. The patient's tolerance of                            previous anesthesia was also reviewed. The risks                            and benefits of the procedure and the sedation                            options and risks were discussed with the patient.                            All questions were answered, and informed consent                            was obtained. Prior Anticoagulants: The patient has                            taken no previous anticoagulant or antiplatelet                            agents. ASA Grade Assessment: II - A patient with  mild systemic disease. After reviewing the risks                            and benefits, the patient was deemed in                            satisfactory condition to undergo the procedure.                           After obtaining informed consent, the scope was                            passed under direct vision. The EC-[id number] ([id number])                            scope was  introduced through the anus and advanced                            to the the splenic flexure. The flexible                            sigmoidoscopy was accomplished without difficulty.                            The patient tolerated the procedure well. The                            quality of the bowel preparation was excellent. Scope In: 2:56:36 PM Scope Out: 3:00:02 PM Total Procedure Duration: 0 hours 3 minutes 26 seconds  Findings:      Deflated external hemorrhoid tissue.      Single trunk of internal hemorrhoids, small to medium sized.      The exam was otherwise without abnormality. Impression:               - Deflated external hemorrhoid.                           - Single trunk of internal hemorrhoids (small to                            medium sized) which is prolapsing and bleeding                            intermittently Moderate Sedation:      Moderate (conscious) sedation was administered by the endoscopy nurse       and supervised by the endoscopist. The following parameters were       monitored: oxygen saturation, heart rate, blood pressure, and response       to care. Total physician intraservice time was 15 minutes. Recommendation:           - Discharge patient to home (ambulatory).                           - Referral for in office hemorrhoidal banding at  [facility]. Procedure Code(s):        --- Professional ---                           [phone], Sigmoidoscopy, flexible; diagnostic,                            including collection of specimen(s) by brushing or                            washing, when performed (separate procedure)                           99152, Moderate sedation services provided by the                            same physician or other qualified health care                            professional performing the diagnostic or                            therapeutic service that the sedation supports,                             requiring the presence of an independent trained                            observer to assist in the monitoring of the                            patient's level of consciousness and physiological                            status; initial 15 minutes of intraservice time,                            patient age 40 years or older Diagnosis Code(s):        --- Professional ---                           K64.8, Other hemorrhoids                           K92.1, Melena (includes Hematochezia) CPT copyright 2016 American Medical Association. All rights reserved. The codes documented in this report are preliminary and upon coder review may  be revised to meet current compliance requirements. Milus Banister, MD 06/01/2017 3:05:55 PM This report has been signed electronically. Number of Addenda: 0

## 2017-06-01 NOTE — H&P (View-Only) (Signed)
HPI: This is a  very pleasant 31 year old woman  who was referred to me by Truett Mainland, DO  to evaluate  rectal bleeding, hemorrhoids .    Chief complaint is rectal bleeding, hemorrhoids  Late February delivery. During pregnancy hemorrhoids were quite bothersome for her. Since her delivery she feels the external components of her hemorrhoids have resolved however she still feels intermittent protruding of a large soft internal  Hemorrhoid and she will have itching, burning after a BM for half an hour to an hour. This will often bleed.  They can bleed intermittnently.  Suppositories helped temporarily only.  Issues with burning itching after  every BM.  Normally has BM 2 times per day. Pretty easy to get out.  Only rare pushing and straining to move her bowels  Overall stable weight.       Review of systems: Pertinent positive and negative review of systems were noted in the above HPI section. All other review negative.   Past Medical History:  Diagnosis Date  . Anxiety   . Arrhythmia    tachycardia  . Depression   . Fatigue   . Headache(784.0)   . Hypothyroid   . PTSD (post-traumatic stress disorder)   . Seizures ([place])    Medication related seizure x 1    Past Surgical History:  Procedure Laterality Date  . CHOLECYSTECTOMY    . WISDOM TOOTH EXTRACTION      Current Outpatient Prescriptions  Medication Sig Dispense Refill  . calcium carbonate (TUMS - DOSED IN MG ELEMENTAL CALCIUM) 500 MG chewable tablet Chew 1-2 tablets by mouth as needed for indigestion or heartburn.    Marland Kitchen ibuprofen (ADVIL,MOTRIN) 600 MG tablet Take 1 tablet (600 mg total) by mouth every 6 (six) hours as needed for moderate pain. 20 tablet 0  . levonorgestrel (MIRENA) 20 MCG/24HR IUD 1 each by Intrauterine route once.    Marland Kitchen levothyroxine (SYNTHROID, LEVOTHROID) 25 MCG tablet Take 1 tablet (25 mcg total) by mouth daily before breakfast. 30 tablet 3  . pramoxine (PROCTOFOAM) 1 % foam Place 1  application rectally 3 (three) times daily as needed for itching. 15 g 2  . Prenat-FeAsp-Meth-FA-DHA w/o A (PRENATE PIXIE) 10-0.6-0.4-200 MG CAPS Take 1 capsule by mouth daily.     No current facility-administered medications for this visit.     Allergies as of 05/16/2017 - Review Complete 05/16/2017  Allergen Reaction Noted  . Morphine and related Nausea And Vomiting 12/21/2014  . Latex Rash 11/23/2016    Family History  Problem Relation Age of Onset  . Alcohol abuse Father   . Drug abuse Father   . Hypertension Father   . Hypertension Mother   . Diabetes Mother   . Leukemia Maternal Grandmother   . Breast cancer Maternal Aunt     Social History   Social History  . Marital status: Married    Spouse name: N/A  . Number of children: N/A  . Years of education: N/A   Occupational History  . homemaker    Social History Main Topics  . Smoking status: Former Smoker    Types: Cigarettes  . Smokeless tobacco: Never Used  . Alcohol use 1.2 oz/week    2 Glasses of wine per week  . Drug use: No     Comment: states "to help with anxiety"  . Sexual activity: Yes    Partners: Male   Other Topics Concern  . Not on file   Social History Narrative  . No narrative  on file     Physical Exam: BP 96/64   Pulse 80   Ht 5\' 4"  (1.626 m)   Wt 208 lb 12.8 oz (94.7 kg)   BMI 35.84 kg/m  Constitutional: generally well-appearing Psychiatric: alert and oriented x3 Eyes: extraocular movements intact Mouth: oral pharynx moist, no lesions Neck: supple no lymphadenopathy Cardiovascular: heart regular rate and rhythm Lungs: clear to auscultation bilaterally Abdomen: soft, nontender, nondistended, no obvious ascites, no peritoneal signs, normal bowel sounds Extremities: no lower extremity edema bilaterally Skin: no lesions on visible extremities Rectal examination with female assistant in the room: Small deflated anterior external anal hemorrhoid tissue. No obvious anal fissures.  Digital rectal exam revealed soft distal rectal tissue that felt like swollen internal hemorrhoids. There was no tenderness. No mass lesions. Stool was brown and not checked for Hemoccult.  Assessment and plan: 31 y.o. female with  hemorrhoids, intermittent rectal bleeding  The external anterior hemorrhoid seems to have resolved. There is also deflated tissue there now. It does feel like she has internal anal hemorrhoids especially posteriorly 2 digital rectal exam. No other obvious pathology on DRE. I recommended flexible sigmoidoscopy to exclude other causes of rectal bleeding such as neoplasm which I think is unlikely. If that is indeed negative then I will refer her to one of my partners to consider hemorrhoidal banding. In the meantime we'll try to bulk her stools up a bit to see if that helps her symptomatically.    Please see the "Patient Instructions" section for addition details about the plan.   Owens Loffler, MD [facility] 05/16/2017, 9:16 AM  Cc: Truett Mainland, DO

## 2017-06-02 ENCOUNTER — Encounter (HOSPITAL_COMMUNITY): Payer: Self-pay | Admitting: Gastroenterology

## 2017-06-06 ENCOUNTER — Encounter: Payer: Self-pay | Admitting: Internal Medicine

## 2017-06-06 ENCOUNTER — Ambulatory Visit (INDEPENDENT_AMBULATORY_CARE_PROVIDER_SITE_OTHER): Payer: Medicaid Other | Admitting: Internal Medicine

## 2017-06-06 VITALS — BP 104/76 | HR 90 | Ht 64.0 in | Wt 211.0 lb

## 2017-06-06 DIAGNOSIS — Z91040 Latex allergy status: Secondary | ICD-10-CM | POA: Diagnosis not present

## 2017-06-06 DIAGNOSIS — K641 Second degree hemorrhoids: Secondary | ICD-10-CM | POA: Diagnosis not present

## 2017-06-06 NOTE — Progress Notes (Signed)
   HEMORRHOID LIGATION: sxs - prolapse, some bleeding, s/p recent flex sig confirming hemorrhoids  Magdalene River, CMA present throughout exam and procedure  Rectal - NL anoderm, nontender, no mass  Anoscopy:  Grade 2 internal hemorrhoids all 3 positions w/ RA most prominent and inflamed  PROCEDURE NOTE: LigationThe patient presents with symptomatic grade 2  hemorrhoids, requesting rubber band ligation of his/her hemorrhoidal disease.  All risks, benefits and alternative forms of therapy were described and informed consent was obtained.   The anorectum was pre-medicated with 0.125% NTG and 5% lidocaine The decision was made to band the RA internal hemorrhoid, and the [place] was used to perform band ligation without complication. LATEX FREE BAND USED Digital anorectal examination was then performed to assure proper positioning of the band, and to adjust the banded tissue as required.  The patient was discharged home without pain or other issues.  Dietary and behavioral recommendations were given and along with follow-up instructions.       The patient will return 06/21/17 for  follow-up and possible additional banding as required. No complications were encountered and the patient tolerated the procedure well.LATEX FREE BANDS TO BE USED

## 2017-06-06 NOTE — Patient Instructions (Signed)
HEMORRHOID BANDING PROCEDURE    FOLLOW-UP CARE   1. The procedure you have had should have been relatively painless since the banding of the area involved does not have nerve endings and there is no pain sensation.  The rubber band cuts off the blood supply to the hemorrhoid and the band may fall off as soon as 48 hours after the banding (the band may occasionally be seen in the toilet bowl following a bowel movement). You may notice a temporary feeling of fullness in the rectum which should respond adequately to plain Tylenol or Motrin.  2. Following the banding, avoid strenuous exercise that evening and resume full activity the next day.  A sitz bath (soaking in a warm tub) or bidet is soothing, and can be useful for cleansing the area after bowel movements.     3. To avoid constipation, take two tablespoons of natural wheat bran, natural oat bran, flax, Benefiber or any over the counter fiber supplement and increase your water intake to 7-8 glasses daily.    4. Unless you have been prescribed anorectal medication, do not put anything inside your rectum for two weeks: No suppositories, enemas, fingers, etc.  5. Occasionally, you may have more bleeding than usual after the banding procedure.  This is often from the untreated hemorrhoids rather than the treated one.  Don't be concerned if there is a tablespoon or so of blood.  If there is more blood than this, lie flat with your bottom higher than your head and apply an ice pack to the area. If the bleeding does not stop within a half an hour or if you feel faint, call our office at [phone] or go to the emergency room.  6. Problems are not common; however, if there is a substantial amount of bleeding, severe pain, chills, fever or difficulty passing urine (very rare) or other problems, you should call us at [phone] or report to the nearest emergency room.  7. Do not stay seated continuously for more than 2-3 hours for a day or two  after the procedure.  Tighten your buttock muscles 10-15 times every two hours and take 10-15 deep breaths every 1-2 hours.  Do not spend more than a few minutes on the toilet if you cannot empty your bowel; instead re-visit the toilet at a later time.    Please follow up with Dr Carlean Purl for more banding 06/21/17 at 11:15AM.    I appreciate the opportunity to care for you. Silvano Rusk, MD, [facility]

## 2017-06-07 ENCOUNTER — Encounter: Payer: Self-pay | Admitting: Internal Medicine

## 2017-06-07 DIAGNOSIS — K641 Second degree hemorrhoids: Secondary | ICD-10-CM | POA: Insufficient documentation

## 2017-06-07 DIAGNOSIS — Z91040 Latex allergy status: Secondary | ICD-10-CM | POA: Insufficient documentation

## 2017-06-07 NOTE — Assessment & Plan Note (Signed)
RA banded Return 9/1/9 repeat banding NOTE use Latex free bands

## 2017-06-08 ENCOUNTER — Encounter: Payer: Self-pay | Admitting: Internal Medicine

## 2017-06-21 ENCOUNTER — Encounter: Payer: Self-pay | Admitting: Internal Medicine

## 2017-06-21 ENCOUNTER — Ambulatory Visit (INDEPENDENT_AMBULATORY_CARE_PROVIDER_SITE_OTHER): Payer: Medicaid Other | Admitting: Internal Medicine

## 2017-06-21 DIAGNOSIS — K641 Second degree hemorrhoids: Secondary | ICD-10-CM | POA: Diagnosis not present

## 2017-06-21 NOTE — Assessment & Plan Note (Addendum)
RP, LL banded Latex free Return as needed

## 2017-06-21 NOTE — Patient Instructions (Signed)
If you are age 31 or older, your body mass index should be between 23-30. Your There is no height or weight on file to calculate BMI. If this is out of the aforementioned range listed, please consider follow up with your Primary Care Provider.  If you are age 89 or younger, your body mass index should be between 19-25. Your There is no height or weight on file to calculate BMI. If this is out of the aformentioned range listed, please consider follow up with your Primary Care Provider.   HEMORRHOID BANDING PROCEDURE    FOLLOW-UP CARE   1. The procedure you have had should have been relatively painless since the banding of the area involved does not have nerve endings and there is no pain sensation.  The rubber band cuts off the blood supply to the hemorrhoid and the band may fall off as soon as 48 hours after the banding (the band may occasionally be seen in the toilet bowl following a bowel movement). You may notice a temporary feeling of fullness in the rectum which should respond adequately to plain Tylenol or Motrin.  2. Following the banding, avoid strenuous exercise that evening and resume full activity the next day.  A sitz bath (soaking in a warm tub) or bidet is soothing, and can be useful for cleansing the area after bowel movements.     3. To avoid constipation, take two tablespoons of natural wheat bran, natural oat bran, flax, Benefiber or any over the counter fiber supplement and increase your water intake to 7-8 glasses daily.    4. Unless you have been prescribed anorectal medication, do not put anything inside your rectum for two weeks: No suppositories, enemas, fingers, etc.  5. Occasionally, you may have more bleeding than usual after the banding procedure.  This is often from the untreated hemorrhoids rather than the treated one.  Don't be concerned if there is a tablespoon or so of blood.  If there is more blood than this, lie flat with your bottom higher than your head and  apply an ice pack to the area. If the bleeding does not stop within a half an hour or if you feel faint, call our office at [phone] or go to the emergency room.  6. Problems are not common; however, if there is a substantial amount of bleeding, severe pain, chills, fever or difficulty passing urine (very rare) or other problems, you should call us at [phone] or report to the nearest emergency room.  7. Do not stay seated continuously for more than 2-3 hours for a day or two after the procedure.  Tighten your buttock muscles 10-15 times every two hours and take 10-15 deep breaths every 1-2 hours.  Do not spend more than a few minutes on the toilet if you cannot empty your bowel; instead re-visit the toilet at a later time.    Follow as needed with Dr. Carlean Purl.

## 2017-06-21 NOTE — Progress Notes (Signed)
   Hemorrhoidal ligation  Returns, status post ligation of right anterior column that December 4 with significant improvement in symptoms. Only minimal prolapse since then.  June McMurray CMA present  PROCEDURE NOTE: The patient presents with symptomatic grade 2 hemorrhoids, requesting rubber band ligation of his/her hemorrhoidal disease.  All risks, benefits and alternative forms of therapy were described and informed consent was obtained.   The anorectum was pre-medicated with 0.125% nitroglycerin topical The decision was made to band the left lateral and right posterior internal hemorrhoids using latex free bands, and the Vincent was used to perform band ligation without complication.  Digital anorectal examination was then performed to assure proper positioning of the band, and to adjust the banded tissue as required.  The patient was discharged home without pain or other issues.  Dietary and behavioral recommendations were given and along with follow-up instructions.      The patient will return in as needed for  follow-up and possible additional banding as required. No complications were encountered and the patient tolerated the procedure well.  I appreciate the opportunity to care for this patient. CC: Robyne Peers, MD Dr. Oretha Caprice

## 2017-08-28 DIAGNOSIS — M549 Dorsalgia, unspecified: Secondary | ICD-10-CM | POA: Insufficient documentation

## 2017-08-28 DIAGNOSIS — G8929 Other chronic pain: Secondary | ICD-10-CM | POA: Insufficient documentation

## 2017-08-28 DIAGNOSIS — M25561 Pain in right knee: Secondary | ICD-10-CM | POA: Insufficient documentation

## 2017-08-28 DIAGNOSIS — G4700 Insomnia, unspecified: Secondary | ICD-10-CM | POA: Insufficient documentation

## 2017-10-24 DIAGNOSIS — D2261 Melanocytic nevi of right upper limb, including shoulder: Secondary | ICD-10-CM | POA: Insufficient documentation

## 2017-10-27 ENCOUNTER — Ambulatory Visit (INDEPENDENT_AMBULATORY_CARE_PROVIDER_SITE_OTHER): Payer: Medicaid Other | Admitting: Family Medicine

## 2017-10-27 ENCOUNTER — Encounter: Payer: Self-pay | Admitting: Family Medicine

## 2017-10-27 VITALS — BP 119/71 | HR 70 | Ht 64.0 in

## 2017-10-27 DIAGNOSIS — R319 Hematuria, unspecified: Secondary | ICD-10-CM

## 2017-10-27 DIAGNOSIS — N393 Stress incontinence (female) (male): Secondary | ICD-10-CM

## 2017-10-27 LAB — POCT URINALYSIS DIPSTICK
Bilirubin, UA: NEGATIVE
LEUKOCYTES UA: NEGATIVE
NITRITE UA: NEGATIVE
PH UA: 6 (ref 5.0–8.0)
PROTEIN UA: NEGATIVE
SPEC GRAV UA: 1.01 (ref 1.010–1.025)
UROBILINOGEN UA: 1 U/dL

## 2017-10-27 NOTE — Progress Notes (Signed)
Patient states that she can go to the restroom and then has to cough,sneeze, or blow her nose and then has the urge to urinate again. Kathrene Alu RNBSN

## 2017-10-27 NOTE — Progress Notes (Signed)
   Subjective:    Patient ID: Michele Armstrong, female    DOB: 1986/02/20, 32 y.o.   MRN: [id number]  HPI Patient seen for stress incontinence that occurred since the time of delivery. She is approximately 1 year post SVD with 1st degree vaginal laceration. Incontinence occurs with laughing, coughing, sneezing, and can occur after she has just urinated. Symptoms are worsening.   Review of Systems     Objective:   Physical Exam  Constitutional: She is oriented to person, place, and time.  Cardiovascular: Normal rate.  Pulmonary/Chest: Effort normal.  Abdominal: Hernia confirmed negative in the right inguinal area and confirmed negative in the left inguinal area.  Genitourinary: There is no rash, tenderness, lesion or injury on the right labia. There is no rash, tenderness, lesion or injury on the left labia. Cervix exhibits no friability. No erythema, tenderness or bleeding in the vagina. No foreign body in the vagina. No signs of injury around the vagina. No vaginal discharge found.    Genitourinary Comments: No evidence of fistula  Lymphadenopathy:       Right: No inguinal adenopathy present.       Left: No inguinal adenopathy present.  Neurological: She is alert and oriented to person, place, and time.  Skin: Skin is warm and dry.  Psychiatric: She has a normal mood and affect. Her behavior is normal. Judgment and thought content normal.       Assessment & Plan:  1. Stress incontinence, female Will send culture. No structural abnormality. Will refer initially to PT. If this is not helpful, will refer to urology. - POCT Urinalysis Dipstick - Urine Culture - Ambulatory referral to Physical Therapy  2. Hematuria, unspecified type - Urine Culture

## 2017-10-27 NOTE — Patient Instructions (Signed)
Urinary Incontinence Urinary incontinence is the involuntary loss of urine from your bladder. What are the causes? There are many causes of urinary incontinence. They include:  Medicines.  Infections.  Prostatic enlargement, leading to overflow of urine from your bladder.  Surgery.  Neurological diseases.  Emotional factors.  What are the signs or symptoms? Urinary Incontinence can be divided into four types: 1. Urge incontinence. Urge incontinence is the involuntary loss of urine before you have the opportunity to go to the bathroom. There is a sudden urge to void but not enough time to reach a bathroom. 2. Stress incontinence. Stress incontinence is the sudden loss of urine with any activity that forces urine to pass. It is commonly caused by anatomical changes to the pelvis and sphincter areas of your body. 3. Overflow incontinence. Overflow incontinence is the loss of urine from an obstructed opening to your bladder. This results in a backup of urine and a resultant buildup of pressure within the bladder. When the pressure within the bladder exceeds the closing pressure of the sphincter, the urine overflows, which causes incontinence, similar to water overflowing a dam. 4. Total incontinence. Total incontinence is the loss of urine as a result of the inability to store urine within your bladder.  How is this diagnosed? Evaluating the cause of incontinence may require:  A thorough and complete medical and obstetric history.  A complete physical exam.  Laboratory tests such as a urine culture and sensitivities.  When additional tests are indicated, they can include:  An ultrasound exam.  Kidney and bladder X-rays.  Cystoscopy. This is an exam of the bladder using a narrow scope.  Urodynamic testing to test the nerve function to the bladder and sphincter areas.  How is this treated? Treatment for urinary incontinence depends on the cause:  For urge incontinence caused  by a bacterial infection, antibiotics will be prescribed. If the urge incontinence is related to medicines you take, your health care provider may have you change the medicine.  For stress incontinence, surgery to re-establish anatomical support to the bladder or sphincter, or both, will often correct the condition.  For overflow incontinence caused by an enlarged prostate, an operation to open the channel through the enlarged prostate will allow the flow of urine out of the bladder. In women with fibroids, a hysterectomy may be recommended.  For total incontinence, surgery on your urinary sphincter may help. An artificial urinary sphincter (an inflatable cuff placed around the urethra) may be required. In women who have developed a hole-like passage between their bladder and vagina (vesicovaginal fistula), surgery to close the fistula often is required.  Follow these instructions at home:  Normal daily hygiene and the use of pads or adult diapers that are changed regularly will help prevent odors and skin damage.  Avoid caffeine. It can overstimulate your bladder.  Use the bathroom regularly. Try about every 2-3 hours to go to the bathroom, even if you do not feel the need to do so. Take time to empty your bladder completely. After urinating, wait a minute. Then try to urinate again.  For causes involving nerve dysfunction, keep a log of the medicines you take and a journal of the times you go to the bathroom. Contact a health care provider if:  You experience worsening of pain instead of improvement in pain after your procedure.  Your incontinence becomes worse instead of better. Get help right away if:  You experience fever or shaking chills.  You are unable to   pass your urine.  You have redness spreading into your groin or down into your thighs. This information is not intended to replace advice given to you by your health care provider. Make sure you discuss any questions you have  with your health care provider. Document Released: 10/27/2004 Document Revised: 04/29/2016 Document Reviewed: 02/26/2013 Elsevier Interactive Patient Education  2018 [employer]  

## 2017-10-29 LAB — URINE CULTURE

## 2018-01-27 ENCOUNTER — Emergency Department (HOSPITAL_BASED_OUTPATIENT_CLINIC_OR_DEPARTMENT_OTHER): Payer: Medicaid Other

## 2018-01-27 ENCOUNTER — Other Ambulatory Visit: Payer: Self-pay

## 2018-01-27 ENCOUNTER — Encounter (HOSPITAL_BASED_OUTPATIENT_CLINIC_OR_DEPARTMENT_OTHER): Payer: Self-pay | Admitting: *Deleted

## 2018-01-27 ENCOUNTER — Emergency Department (HOSPITAL_BASED_OUTPATIENT_CLINIC_OR_DEPARTMENT_OTHER)
Admission: EM | Admit: 2018-01-27 | Discharge: 2018-01-27 | Disposition: A | Discharge status: Home / Self Care | Payer: Medicaid Other | Attending: Emergency Medicine | Admitting: Emergency Medicine

## 2018-01-27 DIAGNOSIS — E039 Hypothyroidism, unspecified: Secondary | ICD-10-CM | POA: Diagnosis not present

## 2018-01-27 DIAGNOSIS — Z87891 Personal history of nicotine dependence: Secondary | ICD-10-CM | POA: Insufficient documentation

## 2018-01-27 DIAGNOSIS — R05 Cough: Secondary | ICD-10-CM | POA: Diagnosis present

## 2018-01-27 DIAGNOSIS — R112 Nausea with vomiting, unspecified: Secondary | ICD-10-CM | POA: Insufficient documentation

## 2018-01-27 DIAGNOSIS — J189 Pneumonia, unspecified organism: Secondary | ICD-10-CM | POA: Insufficient documentation

## 2018-01-27 LAB — URINALYSIS, ROUTINE W REFLEX MICROSCOPIC
Glucose, UA: NEGATIVE mg/dL
HGB URINE DIPSTICK: NEGATIVE
KETONES UR: 15 mg/dL — AB
NITRITE: NEGATIVE
PH: 6.5 (ref 5.0–8.0)
Protein, ur: 30 mg/dL — AB
SPECIFIC GRAVITY, URINE: 1.025 (ref 1.005–1.030)

## 2018-01-27 LAB — URINALYSIS, MICROSCOPIC (REFLEX): RBC / HPF: NONE SEEN RBC/hpf (ref 0–5)

## 2018-01-27 LAB — PREGNANCY, URINE: PREG TEST UR: NEGATIVE

## 2018-01-27 MED ORDER — ONDANSETRON 4 MG PO TBDP: 4.0000 mg | ORAL_TABLET | Freq: Three times a day (TID) | ORAL | 0 refills | Status: DC | PRN

## 2018-01-27 MED ORDER — ALBUTEROL SULFATE HFA 108 (90 BASE) MCG/ACT IN AERS: 1.0000 | INHALATION_SPRAY | Freq: Four times a day (QID) | RESPIRATORY_TRACT | 0 refills | Status: DC | PRN

## 2018-01-27 MED ORDER — AZITHROMYCIN 250 MG PO TABS: 250.0000 mg | ORAL_TABLET | Freq: Every day | ORAL | 0 refills | Status: DC

## 2018-01-27 MED ORDER — BENZONATATE 100 MG PO CAPS: 100.0000 mg | ORAL_CAPSULE | Freq: Three times a day (TID) | ORAL | 0 refills | Status: DC

## 2018-01-27 NOTE — Discharge Instructions (Signed)
We believe that your symptoms are caused today by pneumonia, an infection in your lung(s).  Fortunately you should start to improve quickly after taking your antibiotics.  Please take the full course of antibiotics as prescribed and drink plenty of fluids.   ° °Follow up with your doctor within 1-2 days.  If you develop any new or worsening symptoms, including but not limited to fever in spite of taking over-the-counter ibuprofen and/or Tylenol, persistent vomiting, worsening shortness of breath, or other symptoms that concern you, please return to the Emergency Department immediately.  ° ° °Pneumonia °Pneumonia is an infection of the lungs.  °CAUSES °Pneumonia may be caused by bacteria or a virus. Usually, these infections are caused by breathing infectious particles into the lungs (respiratory tract). °SIGNS AND SYMPTOMS  °Cough. °Fever. °Chest pain. °Increased rate of breathing. °Wheezing. °Mucus production. °DIAGNOSIS  °If you have the common symptoms of pneumonia, your health care provider will typically confirm the diagnosis with a chest X-ray. The X-ray will show an abnormality in the lung (pulmonary infiltrate) if you have pneumonia. Other tests of your blood, urine, or sputum may be done to find the specific cause of your pneumonia. Your health care provider may also do tests (blood gases or pulse oximetry) to see how well your lungs are working. °TREATMENT  °Some forms of pneumonia may be spread to other people when you cough or sneeze. You may be asked to wear a mask before and during your exam. Pneumonia that is caused by bacteria is treated with antibiotic medicine. Pneumonia that is caused by the influenza virus may be treated with an antiviral medicine. Most other viral infections must run their course. These infections will not respond to antibiotics.  °HOME CARE INSTRUCTIONS  °Cough suppressants may be used if you are losing too much rest. However, coughing protects you by clearing your lungs. You  should avoid using cough suppressants if you can. °Your health care provider may have prescribed medicine if he or she thinks your pneumonia is caused by bacteria or influenza. Finish your medicine even if you start to feel better. °Your health care provider may also prescribe an expectorant. This loosens the mucus to be coughed up. °Take medicines only as directed by your health care provider. °Do not smoke. Smoking is a common cause of bronchitis and can contribute to pneumonia. If you are a smoker and continue to smoke, your cough may last several weeks after your pneumonia has cleared. °A cold steam vaporizer or humidifier in your room or home may help loosen mucus. °Coughing is often worse at night. Sleeping in a semi-upright position in a recliner or using a couple pillows under your head will help with this. °Get rest as you feel it is needed. Your body will usually let you know when you need to rest. °PREVENTION °A pneumococcal shot (vaccine) is available to prevent a common bacterial cause of pneumonia. This is usually suggested for: °People over 65 years old. °Patients on chemotherapy. °People with chronic lung problems, such as bronchitis or emphysema. °People with immune system problems. °If you are over 65 or have a high risk condition, you may receive the pneumococcal vaccine if you have not received it before. In some countries, a routine influenza vaccine is also recommended. This vaccine can help prevent some cases of pneumonia. You may be offered the influenza vaccine as part of your care. °If you smoke, it is time to quit. You may receive instructions on how to stop smoking. Your   health care provider can provide medicines and counseling to help you quit. °SEEK MEDICAL CARE IF: °You have a fever. °SEEK IMMEDIATE MEDICAL CARE IF:  °Your illness becomes worse. This is especially true if you are elderly or weakened from any other disease. °You cannot control your cough with suppressants and are losing  sleep. °You begin coughing up blood. °You develop pain which is getting worse or is uncontrolled with medicines. °Any of the symptoms which initially brought you in for treatment are getting worse rather than better. °You develop shortness of breath or chest pain. °MAKE SURE YOU:  °Understand these instructions. °Will watch your condition. °Will get help right away if you are not doing well or get worse. °Document Released: 09/19/2005 Document Revised: 02/03/2014 Document Reviewed: 12/09/2010 °ExitCare® Patient Information ©2015 [employer]. This information is not intended to replace advice given to you by your health care provider. Make sure you discuss any questions you have with your health care provider. ° ° ° °

## 2018-01-27 NOTE — ED Notes (Signed)
Alert, NAD, calm, interactive, resps e/u, speaking in clear complete sentences, no dyspnea noted, skin W&D, VSS, HA improved, 4/10. Family at [facility].

## 2018-01-27 NOTE — ED Provider Notes (Signed)
Emergency Department Provider Note   I have reviewed the triage vital signs and the nursing notes.   HISTORY  Chief Complaint Cough   HPI Dalana Pfahler is a 32 y.o. female with PMH of anxiety, PTSD, and history of being a former smoker presents to the emergency department for evaluation of worsening respiratory symptoms including cough, congestion, with intermittent nausea and vomiting.  Patient states her child was diagnosed with an ear infection.  She began having multiple episodes of nausea and vomiting during days 1 and 2 of the illness.  Her nausea and vomiting have improved but she still will have occasional vomiting.  Over the past 4 to 5 days her difficulty breathing, cough, congestion symptoms have worsened.  She denies any hemoptysis.  No associated diarrhea.  She denies any nausea currently. No chest or abdominal pain. Mild sore throat. No radiation of symptoms or modifying factors.   Past Medical History:  Diagnosis Date  . Anxiety   . Arrhythmia    tachycardia  . Depression   . Fatigue   . Headache(784.0)   . Hypothyroid   . Internal hemorrhoids   . PTSD (post-traumatic stress disorder)   . Seizures ([place])    Medication related seizure x 1    Patient Active Problem List   Diagnosis Date Noted  . Stress incontinence, female 10/27/2017  . Prolapsed internal hemorrhoids, grade 2 06/07/2017  . Latex allergy 06/07/2017  . Rectal bleeding   . PTSD (post-traumatic stress disorder) 08/22/2011  . Depression, major, recurrent ([place]) 08/22/2011  . Hypothyroid 08/22/2011    Past Surgical History:  Procedure Laterality Date  . CHOLECYSTECTOMY    . FLEXIBLE SIGMOIDOSCOPY N/A 06/01/2017   Procedure: FLEXIBLE SIGMOIDOSCOPY;  Surgeon: Milus Banister, MD;  Location: [facility];  Service: Endoscopy;  Laterality: N/A;  . HEMORRHOID BANDING  2018  . WISDOM TOOTH EXTRACTION      Current Outpatient Rx  . Order #: [id number] Class: Historical Med  . Order #:  [id number] Class: Historical Med  . Order #: [id number] Class: Historical Med  . Order #: [id number] Class: Print  . Order #: [id number] Class: Print  . Order #: [id number] Class: Print  . Order #: [id number] Class: Historical Med  . Order #: [id number] Class: Historical Med  . Order #: [id number] Class: Normal  . Order #: [id number] Class: Print    Allergies Morphine and related and Latex  Family History  Problem Relation Age of Onset  . Alcohol abuse Father   . Drug abuse Father   . Hypertension Father   . Hypertension Mother   . Diabetes Mother   . Leukemia Maternal Grandmother   . Breast cancer Maternal Aunt   . Colon cancer Neg Hx   . Stomach cancer Neg Hx     Social History Social History   Tobacco Use  . Smoking status: Former Smoker    Types: Cigarettes  . Smokeless tobacco: Never Used  Substance Use Topics  . Alcohol use: Yes    Alcohol/week: 1.2 oz    Types: 2 Glasses of wine per week  . Drug use: No    Frequency: 4.0 times per week    Types: Marijuana, Other-see comments    Comment: states "to help with anxiety"    Review of Systems  Constitutional: Positive fever.  Eyes: No visual changes. ENT: Positive mild sore throat. Positive runny nose.  Cardiovascular: Denies chest pain. Respiratory: Denies shortness of breath. Positive cough.  Gastrointestinal: No abdominal pain. Positive nausea and vomiting.  No diarrhea.  No constipation. Genitourinary:  Negative for dysuria. Musculoskeletal: Negative for back pain. Skin: Negative for rash. Neurological: Negative for headaches, focal weakness or numbness.  10-point ROS otherwise negative.  ____________________________________________   PHYSICAL EXAM:  VITAL SIGNS: ED Triage Vitals  Enc Vitals Group     BP 01/27/18 2021 124/82     Pulse Rate 01/27/18 2021 98     Resp 01/27/18 2021 20     Temp 01/27/18 2021 98.3 F (36.8 C)     Temp src --      SpO2 01/27/18 2021 98 %     Weight 01/27/18 2021 200 lb (90.7 kg)       Height 01/27/18 2021 5\' 4"  (1.626 m)     Pain Score 01/27/18 2022 1   Constitutional: Alert and oriented. Well appearing and in no acute distress. Eyes: Conjunctivae are normal. Head: Atraumatic. Nose: No congestion/rhinnorhea. Mouth/Throat: Mucous membranes are moist.  Oropharynx with mild erythema. No exudate.  Neck: No stridor.   Cardiovascular: Normal rate, regular rhythm. Good peripheral circulation. Grossly normal heart sounds.   Respiratory: Normal respiratory effort.  No retractions. Lungs CTAB. Gastrointestinal: Soft and nontender. No distention.  Musculoskeletal: No lower extremity tenderness nor edema. No gross deformities of extremities. Neurologic:  Normal speech and language. No gross focal neurologic deficits are appreciated.  Skin:  Skin is warm, dry and intact. No rash noted.  ____________________________________________   LABS (all labs ordered are listed, but only abnormal results are displayed)  Labs Reviewed  URINALYSIS, ROUTINE W REFLEX MICROSCOPIC - Abnormal; Notable for the following components:      Result Value   APPearance CLOUDY (*)    Bilirubin Urine SMALL (*)    Ketones, ur 15 (*)    Protein, ur 30 (*)    Leukocytes, UA TRACE (*)    All other components within normal limits  URINALYSIS, MICROSCOPIC (REFLEX) - Abnormal; Notable for the following components:   Bacteria, UA MANY (*)    All other components within normal limits  PREGNANCY, URINE   ____________________________________________  RADIOLOGY  Dg Chest 2 View  Result Date: 01/27/2018 CLINICAL DATA:  Initial evaluation for acute cough, congestion. EXAM: CHEST - 2 VIEW COMPARISON:  Prior CT from 05/22/2011. FINDINGS: The cardiac and mediastinal silhouettes are stable in size and contour, and remain within normal limits. The lungs are normally inflated. Hazy and somewhat linear densities present within the peripheral left lung base, which may reflect atelectasis or small infiltrate. No  other focal airspace disease. No pulmonary edema or pleural effusion. No pneumothorax. No acute osseous abnormality identified. IMPRESSION: Hazy opacity at the peripheral left lung base, which could reflect atelectasis or infiltrate. Electronically Signed   By: Jeannine Boga M.D.   On: 01/27/2018 21:06    ____________________________________________   PROCEDURES  Procedure(s) performed:   Procedures  None ____________________________________________   INITIAL IMPRESSION / ASSESSMENT AND PLAN / ED COURSE  Pertinent labs & imaging results that were available during my care of the patient were reviewed by me and considered in my medical decision making (see chart for details).  Patient presents to the emergency department for evaluation of respiratory infection symptoms with fever, cough, and congestion.  Early in the course of illness she had significant nausea and vomiting but the symptoms have improved.  She denies any nausea currently.  On exam is largely unremarkable.  She does have frequent coughing.  Plan for chest x-ray, UA, and reassess.  She has no fever, tachycardia, hypoxemia.  UA and plain film reviewed. Possible  infiltrate which correlates clinically. No hypoxemia. Plan for azithromycin, albuterol, tessalon, and Zofran. Patient with no nausea in the ED.   At this time, I do not feel there is any life-threatening condition present. I have reviewed and discussed all results (EKG, imaging, lab, urine as appropriate), exam findings with patient. I have reviewed nursing notes and appropriate previous records.  I feel the patient is safe to be discharged home without further emergent workup. Discussed usual and customary return precautions. Patient and family (if present) verbalize understanding and are comfortable with this plan.  Patient will follow-up with their primary care provider. If they do not have a primary care provider, information for follow-up has been provided to  them. All questions have been answered.  ____________________________________________  FINAL CLINICAL IMPRESSION(S) / ED DIAGNOSES  Final diagnoses:  Community acquired pneumonia, unspecified laterality  Non-intractable vomiting with nausea, unspecified vomiting type     MEDICATIONS GIVEN DURING THIS VISIT:  Medications - No data to display   NEW OUTPATIENT MEDICATIONS STARTED DURING THIS VISIT:  Discharge Medication List as of 01/27/2018  9:15 PM    START taking these medications   Details  albuterol (PROVENTIL HFA;VENTOLIN HFA) 108 (90 Base) MCG/ACT inhaler Inhale 1-2 puffs into the lungs every 6 (six) hours as needed for wheezing or shortness of breath., Starting Sat 01/27/2018, Print    azithromycin (ZITHROMAX) 250 MG tablet Take 1 tablet (250 mg total) by mouth daily. Take first 2 tablets together, then 1 every day until finished., Starting Sat 01/27/2018, Print    benzonatate (TESSALON) 100 MG capsule Take 1 capsule (100 mg total) by mouth every 8 (eight) hours., Starting Sat 01/27/2018, Print    ondansetron (ZOFRAN ODT) 4 MG disintegrating tablet Take 1 tablet (4 mg total) by mouth every 8 (eight) hours as needed for nausea or vomiting., Starting Sat 01/27/2018, Print        Note:  This document was prepared using Dragon voice recognition software and may include unintentional dictation errors.  Nanda Quinton, MD Emergency Medicine    Long, [facility], MD 01/27/18 [phone]

## 2018-01-27 NOTE — ED Notes (Signed)
ED Provider at bedside. 

## 2018-01-27 NOTE — ED Triage Notes (Signed)
Pt states she has been sick X 1 week. Describes cough, congestion, N/V, fever today. Other family members sick as well.

## 2018-05-30 DIAGNOSIS — K649 Unspecified hemorrhoids: Secondary | ICD-10-CM | POA: Insufficient documentation

## 2018-05-30 DIAGNOSIS — K219 Gastro-esophageal reflux disease without esophagitis: Secondary | ICD-10-CM | POA: Insufficient documentation

## 2018-08-03 ENCOUNTER — Ambulatory Visit (INDEPENDENT_AMBULATORY_CARE_PROVIDER_SITE_OTHER): Payer: Medicaid Other | Admitting: Family Medicine

## 2018-08-03 ENCOUNTER — Encounter: Payer: Self-pay | Admitting: Family Medicine

## 2018-08-03 VITALS — BP 106/83 | HR 97 | Wt 217.0 lb

## 2018-08-03 DIAGNOSIS — Z0000 Encounter for general adult medical examination without abnormal findings: Secondary | ICD-10-CM | POA: Diagnosis not present

## 2018-08-03 DIAGNOSIS — Z30431 Encounter for routine checking of intrauterine contraceptive device: Secondary | ICD-10-CM

## 2018-08-03 DIAGNOSIS — Z01419 Encounter for gynecological examination (general) (routine) without abnormal findings: Secondary | ICD-10-CM
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
## 2019-02-26 ENCOUNTER — Ambulatory Visit (INDEPENDENT_AMBULATORY_CARE_PROVIDER_SITE_OTHER): Payer: Medicaid Other

## 2019-02-26 ENCOUNTER — Other Ambulatory Visit (HOSPITAL_COMMUNITY)
Admission: RE | Admit: 2019-02-26 | Discharge: 2019-02-26 | Disposition: A | Discharge status: Home / Self Care | Payer: Medicaid Other | Source: Ambulatory Visit | Attending: Family Medicine | Admitting: Family Medicine

## 2019-02-26 ENCOUNTER — Other Ambulatory Visit: Payer: Self-pay

## 2019-02-26 VITALS — BP 109/65 | HR 112 | Ht 64.0 in | Wt 255.0 lb

## 2019-02-26 DIAGNOSIS — Z113 Encounter for screening for infections with a predominantly sexual mode of transmission: Secondary | ICD-10-CM

## 2019-02-26 DIAGNOSIS — R399 Unspecified symptoms and signs involving the genitourinary system: Secondary | ICD-10-CM | POA: Diagnosis not present

## 2019-02-26 LAB — POCT URINALYSIS DIPSTICK
Glucose, UA: NEGATIVE
Leukocytes, UA: NEGATIVE
Nitrite, UA: NEGATIVE
Protein, UA: NEGATIVE
Spec Grav, UA: 1.025 (ref 1.010–1.025)
pH, UA: 6.5 (ref 5.0–8.0)

## 2019-02-26 NOTE — Progress Notes (Addendum)
Pt states that she has been having some burning and itching with urination  x 3 days. Pt also requests STD testing because ex partner tested positive for STD. Urine and wet prep sent to the lab.  Dalary Hollar l Tevon Berhane, CMA   Attestation of Attending Supervision of RN: Evaluation and management procedures were performed by the nurse under my supervision and collaboration.  I have reviewed the nursing note and chart, and I agree with the management and plan.  Carolyn L. Harraway-Smith, M.D., Cherlynn June

## 2019-02-27 LAB — CERVICOVAGINAL ANCILLARY ONLY
Chlamydia: NEGATIVE
Neisseria Gonorrhea: NEGATIVE
Trichomonas: NEGATIVE

## 2019-02-27 LAB — RPR: RPR Ser Ql: NONREACTIVE

## 2019-02-27 LAB — HEPATITIS B SURFACE ANTIGEN: Hepatitis B Surface Ag: NEGATIVE

## 2019-02-27 LAB — HIV ANTIBODY (ROUTINE TESTING W REFLEX): HIV Screen 4th Generation wRfx: NONREACTIVE

## 2019-02-27 LAB — HEPATITIS C ANTIBODY: Hep C Virus Ab: <0.1 s/co ratio (ref 0.0–0.9)

## 2019-02-28 LAB — URINE CULTURE

## 2019-03-01 ENCOUNTER — Telehealth: Payer: Self-pay

## 2019-03-01 NOTE — Telephone Encounter (Signed)
Patient called and identified by name and dob. Patient requesting STI results. Patient made aware that all labs were resulted as negative. Kathrene Alu RN

## 2019-03-22 ENCOUNTER — Other Ambulatory Visit: Payer: Self-pay

## 2019-03-22 ENCOUNTER — Encounter (HOSPITAL_BASED_OUTPATIENT_CLINIC_OR_DEPARTMENT_OTHER): Payer: Self-pay

## 2019-03-22 ENCOUNTER — Emergency Department (HOSPITAL_BASED_OUTPATIENT_CLINIC_OR_DEPARTMENT_OTHER): Payer: Medicaid Other

## 2019-03-22 ENCOUNTER — Emergency Department (HOSPITAL_BASED_OUTPATIENT_CLINIC_OR_DEPARTMENT_OTHER)
Admission: EM | Admit: 2019-03-22 | Discharge: 2019-03-22 | Disposition: A | Discharge status: Home / Self Care | Payer: Medicaid Other | Attending: Emergency Medicine | Admitting: Emergency Medicine

## 2019-03-22 DIAGNOSIS — Z87891 Personal history of nicotine dependence: Secondary | ICD-10-CM | POA: Insufficient documentation

## 2019-03-22 DIAGNOSIS — R05 Cough: Secondary | ICD-10-CM | POA: Diagnosis not present

## 2019-03-22 DIAGNOSIS — R079 Chest pain, unspecified: Secondary | ICD-10-CM | POA: Diagnosis not present

## 2019-03-22 DIAGNOSIS — R0602 Shortness of breath: Secondary | ICD-10-CM | POA: Diagnosis not present

## 2019-03-22 DIAGNOSIS — R059 Cough, unspecified: Secondary | ICD-10-CM

## 2019-03-22 LAB — CBC WITH DIFFERENTIAL/PLATELET
Abs Immature Granulocytes: 0.05 10*3/uL (ref 0.00–0.07)
Basophils Absolute: 0 10*3/uL (ref 0.0–0.1)
Basophils Relative: 0 %
Eosinophils Absolute: 0.1 10*3/uL (ref 0.0–0.5)
Eosinophils Relative: 1 %
HCT: 43.4 % (ref 36.0–46.0)
Hemoglobin: 14.2 g/dL (ref 12.0–15.0)
Immature Granulocytes: 1 %
Lymphocytes Relative: 18 %
Lymphs Abs: 1.8 10*3/uL (ref 0.7–4.0)
MCH: 31 pg (ref 26.0–34.0)
MCHC: 32.7 g/dL (ref 30.0–36.0)
MCV: 94.8 fL (ref 80.0–100.0)
Monocytes Absolute: 0.6 10*3/uL (ref 0.1–1.0)
Monocytes Relative: 6 %
Neutro Abs: 7.4 10*3/uL (ref 1.7–7.7)
Neutrophils Relative %: 74 %
Platelets: 254 10*3/uL (ref 150–400)
RBC: 4.58 MIL/uL (ref 3.87–5.11)
RDW: 12.4 % (ref 11.5–15.5)
WBC: 9.9 10*3/uL (ref 4.0–10.5)
nRBC: 0 % (ref 0.0–0.2)

## 2019-03-22 LAB — BASIC METABOLIC PANEL
Anion gap: 9 (ref 5–15)
BUN: 14 mg/dL (ref 6–20)
CO2: 21 mmol/L — ABNORMAL LOW (ref 22–32)
Calcium: 8.8 mg/dL — ABNORMAL LOW (ref 8.9–10.3)
Chloride: 107 mmol/L (ref 98–111)
Creatinine, Ser: 0.65 mg/dL (ref 0.44–1.00)
GFR calc Af Amer: >60 mL/min (ref >60)
GFR calc non Af Amer: >60 mL/min (ref >60)
Glucose, Bld: 114 mg/dL — ABNORMAL HIGH (ref 70–99)
Potassium: 4.1 mmol/L (ref 3.5–5.1)
Sodium: 137 mmol/L (ref 135–145)

## 2019-03-22 LAB — D-DIMER, QUANTITATIVE: D-Dimer, Quant: 0.52 ug/mL-FEU — ABNORMAL HIGH (ref 0.00–0.50)

## 2019-03-22 LAB — TROPONIN I: Troponin I: <0.03 ng/mL (ref <0.03)

## 2019-03-22 MED ORDER — IOHEXOL 350 MG/ML SOLN
100.0000 mL | Freq: Once | INTRAVENOUS | Status: AC | PRN
  Administered 2019-03-22: 100 mL via INTRAVENOUS

## 2019-03-22 NOTE — ED Notes (Signed)
Patient transported to X-ray 

## 2019-03-22 NOTE — ED Notes (Signed)
Pt returned to room from CT

## 2019-03-22 NOTE — ED Triage Notes (Signed)
o CP, SOB cough x 6 days-negative covid test at [facility] yesterday-NAD-steady gait

## 2019-03-22 NOTE — ED Provider Notes (Signed)
[facility] EMERGENCY DEPARTMENT Provider Note   CSN: [id number] Arrival date & time: 03/22/19  1509    History   Chief Complaint Chief Complaint  Patient presents with  . Chest Pain    HPI Michele Armstrong is a 33 y.o. female.     The history is provided by the patient and medical records. No language interpreter was used.  Chest Pain Associated symptoms: cough, fever (Low grade, not 100 or higher) and shortness of breath   Associated symptoms: no abdominal pain, no nausea, no palpitations and no vomiting    Michele Armstrong is a 33 y.o. female  with a PMH as listed below who presents to the Emergency Department complaining of chest pain which began 6 days ago. Worse with deep breathing. Has been progressively worsening throughout the week. Associated with shortness of breath which has also been progressively worsening. Today, she feels much more short of breath and gets dyspneic with walking short distances which is very unusual for her. She reports intermittent dry cough. No temps higher than 99's, but has seemed to run a low grade temperature all week. Went to [facility] Tuesday for [facility] test which was negative.  She called her primary care doctor who did a virtual visit with her today, however recommended that she have an in person visit given her complaints.  Not on OCPs.  No lower extremity pain/swelling.  No recent surgery/immobilization/travel.  Does have history of tachycardia, stating that her heart rate has been in the 130s on routine visits.    Past Medical History:  Diagnosis Date  . Anxiety   . Arrhythmia    tachycardia  . Depression   . Fatigue   . Headache(784.0)   . Hypothyroid   . Internal hemorrhoids   . PTSD (post-traumatic stress disorder)   . Seizures ([place])    Medication related seizure x 1    Patient Active Problem List   Diagnosis Date Noted  . Stress incontinence, female 10/27/2017  . Prolapsed internal hemorrhoids, grade 2 06/07/2017  .  Latex allergy 06/07/2017  . PTSD (post-traumatic stress disorder) 08/22/2011  . Depression, major, recurrent ([place]) 08/22/2011  . Hypothyroid 08/22/2011    Past Surgical History:  Procedure Laterality Date  . CHOLECYSTECTOMY    . FLEXIBLE SIGMOIDOSCOPY N/A 06/01/2017   Procedure: FLEXIBLE SIGMOIDOSCOPY;  Surgeon: Milus Banister, MD;  Location: [facility];  Service: Endoscopy;  Laterality: N/A;  . HEMORRHOID BANDING  2018  . WISDOM TOOTH EXTRACTION       OB History    Gravida  2   Para  1   Term  1   Preterm      AB  1   Living  1     SAB      TAB      Ectopic      Multiple  0   Live Births  1            Home Medications    Prior to Admission medications   Medication Sig Start Date End Date Taking? Authorizing Provider  acetaminophen (TYLENOL) 325 MG tablet Take 650 mg by mouth every 6 (six) hours as needed.    [provider]  busPIRone (BUSPAR) 10 MG tablet Take 10 mg by mouth 3 (three) times daily.    [provider]  calcium carbonate (TUMS - DOSED IN MG ELEMENTAL CALCIUM) 500 MG chewable tablet Chew 1-2 tablets by mouth as needed for indigestion or heartburn.  [provider]  ibuprofen (ADVIL,MOTRIN) 200 MG tablet Take 800 mg by mouth every 6 (six) hours as needed for headache.    [provider]  levonorgestrel (MIRENA) 20 MCG/24HR IUD 1 each by Intrauterine route once.    [provider]  omeprazole (PRILOSEC) 10 MG capsule Take 10 mg by mouth daily.    [provider]  Prenat-FeAsp-Meth-FA-DHA w/o A (PRENATE PIXIE) 10-0.6-0.4-200 MG CAPS Take 1 capsule by mouth daily.    [provider]    Family History Family History  Problem Relation Age of Onset  . Alcohol abuse Father   . Drug abuse Father   . Hypertension Father   . Hypertension Mother   . Diabetes Mother   . Leukemia Maternal Grandmother   . Breast cancer Maternal Aunt   . Colon cancer Neg Hx   . Stomach cancer Neg Hx      Social History Social History   Tobacco Use  . Smoking status: Former Smoker    Types: Cigarettes  . Smokeless tobacco: Never Used  Substance Use Topics  . Alcohol use: Yes    Comment: occ  . Drug use: Not Currently    Types: Marijuana, Other-see comments     Allergies   Morphine and related and Latex   Review of Systems Review of Systems  Constitutional: Positive for fever (Low grade, not 100 or higher).  HENT: Negative for congestion and sore throat.   Respiratory: Positive for cough and shortness of breath.   Cardiovascular: Positive for chest pain. Negative for palpitations and leg swelling.  Gastrointestinal: Negative for abdominal pain, diarrhea, nausea and vomiting.  All other systems reviewed and are negative.    Physical Exam Updated Vital Signs BP 124/90 (BP Location: Left Arm)   Pulse (!) 101   Temp 98.7 F (37.1 C) (Oral)   Resp (!) 9   Ht 5\' 4"  (1.626 m)   Wt 104.3 kg   SpO2 99%   BMI 39.48 kg/m   Physical Exam Vitals signs and nursing note reviewed.  Constitutional:      General: She is not in acute distress.    Appearance: She is well-developed.  HENT:     Head: Normocephalic and atraumatic.  Neck:     Musculoskeletal: Neck supple.  Cardiovascular:     Heart sounds: Normal heart sounds. No murmur.     Comments: Tachycardic, but regular. Pulmonary:     Effort: No respiratory distress.     Breath sounds: Normal breath sounds.     Comments: Lungs clear to auscultation bilaterally. Slightly increased effort in breathing. Abdominal:     General: There is no distension.     Palpations: Abdomen is soft.     Tenderness: There is no abdominal tenderness.  Skin:    General: Skin is warm and dry.  Neurological:     Mental Status: She is alert and oriented to person, place, and time.      ED Treatments / Results  Labs (all labs ordered are listed, but only abnormal results are displayed) Labs Reviewed  BASIC METABOLIC PANEL -  Abnormal; Notable for the following components:      Result Value   CO2 21 (*)    Glucose, Bld 114 (*)    Calcium 8.8 (*)    All other components within normal limits  D-DIMER, QUANTITATIVE (NOT AT [facility]) - Abnormal; Notable for the following components:   D-Dimer, Quant 0.52 (*)    All other components within normal limits  CBC WITH DIFFERENTIAL/PLATELET  TROPONIN I    EKG EKG Interpretation  Date/Time:  Friday March 22 2019 15:21:58 EDT Ventricular Rate:  120 PR Interval:    QRS Duration: 78 QT Interval:  373 QTC Calculation: 528 R Axis:   68 Text Interpretation:  Sinus tachycardia Low voltage, precordial leads Borderline T abnormalities, diffuse leads Prolonged QT interval Baseline wander in lead(s) II III aVR aVF V4 V5 V6 Confirmed by Davonna Belling [phone]) on 03/22/2019 4:41:03 PM   Radiology Dg Chest 2 View  Result Date: 03/22/2019 CLINICAL DATA:  Shortness of breath and chest pain EXAM: CHEST - 2 VIEW COMPARISON:  01/27/2018 FINDINGS: Normal heart size, mediastinal contours, and pulmonary vascularity. Lungs clear. No infiltrate, pleural effusion or pneumothorax. Bones unremarkable. IMPRESSION: No acute abnormalities. Electronically Signed   By: Lavonia Dana M.D.   On: 03/22/2019 16:50   Ct Angio Chest Pe W And/or Wo Contrast  Result Date: 03/22/2019 CLINICAL DATA:  Shortness of breath chest pain and cough EXAM: CT ANGIOGRAPHY CHEST WITH CONTRAST TECHNIQUE: Multidetector CT imaging of the chest was performed using the standard protocol during bolus administration of intravenous contrast. Multiplanar CT image reconstructions and MIPs were obtained to evaluate the vascular anatomy. CONTRAST:  165mL OMNIPAQUE IOHEXOL 350 MG/ML SOLN COMPARISON:  Chest x-ray 03/22/2019, CT 05/22/2011 FINDINGS: Cardiovascular: Satisfactory opacification of the pulmonary arteries to the segmental level. No evidence of pulmonary embolism. Normal heart size. No pericardial effusion. Nonaneurysmal aorta.  No dissection. Mediastinum/Nodes: No enlarged mediastinal, hilar, or axillary lymph nodes. Thyroid gland, trachea, and esophagus demonstrate no significant findings. Lungs/Pleura: Lungs are clear. No pleural effusion or pneumothorax. Upper Abdomen: Hepatic steatosis.  Status post cholecystectomy. Musculoskeletal: No chest wall abnormality. No acute or significant osseous findings. Review of the MIP images confirms the above findings. IMPRESSION: 1. Negative for acute pulmonary embolus or aortic dissection. 2. Hepatic steatosis Electronically Signed   By: Donavan Foil M.D.   On: 03/22/2019 18:44    Procedures Procedures (including critical care time)  Medications Ordered in ED Medications  iohexol (OMNIPAQUE) 350 MG/ML injection 100 mL (100 mLs Intravenous Contrast Given 03/22/19 1808)     Initial Impression / Assessment and Plan / ED Course  I have reviewed the triage vital signs and the nursing notes.  Pertinent labs & imaging results that were available during my care of the patient were reviewed by me and considered in my medical decision making (see chart for details).       Michele Armstrong is a 33 y.o. female who presents to ED for central chest pain for the last 6 days associated with intermittent dry cough and shortness of breath.  Feels as if she has progressively worsening.  She did have COVID test performed on 6/16 which was negative. She had a tele-visit with PCP who recommended she be evaluated in person given symptoms.  Afebrile, hemodynamically stable with clear lung exam and oxygenating well.  EKG without acute ischemic changes and troponin normal.  Doubt ACS, especially given length of symptoms with these reassuring results.  Chest x-ray without acute findings.  No pneumonia.  She is tachycardic in the 110's.  She reports history of tachycardia in this range in the past, however given tachycardia, pleuritic chest pain with shortness of breath, d-dimer was obtained to evaluate for  PE.  Dimer elevated at 0.52. CT angio with no sign of PE / dissection / pna. Patient re-evaluated and informed of her results. She looks much better on re-eval after being given positive  news with results. Her HR is in the high 90's, no longer tachypneic and is now resting comfortably. Evaluation does not show pathology that would require ongoing emergent intervention or inpatient treatment. Will have her follow up with pcp.  Reasons to return to the emergency department were discussed and all questions were answered.   Final Clinical Impressions(s) / ED Diagnoses   Final diagnoses:  Chest pain  Chest pain with low risk for cardiac etiology  Cough    ED Discharge Orders    None       Ward, Ozella Almond, PA-C 03/22/19 1857    Davonna Belling, MD 03/22/19 2347

## 2019-03-22 NOTE — Discharge Instructions (Signed)
It was my pleasure taking care of you today!   Call your primary care doctor on Monday to schedule a follow-up appointment.    Return to the emergency department for new or worsening symptoms, any additional concerns.

## 2019-10-25 ENCOUNTER — Other Ambulatory Visit: Payer: Self-pay

## 2019-10-25 ENCOUNTER — Other Ambulatory Visit (HOSPITAL_COMMUNITY)
Admission: RE | Admit: 2019-10-25 | Discharge: 2019-10-25 | Disposition: A | Discharge status: Home / Self Care | Payer: Medicaid Other | Source: Ambulatory Visit | Attending: Family Medicine | Admitting: Family Medicine

## 2019-10-25 ENCOUNTER — Encounter: Payer: Self-pay | Admitting: Family Medicine

## 2019-10-25 ENCOUNTER — Ambulatory Visit (INDEPENDENT_AMBULATORY_CARE_PROVIDER_SITE_OTHER): Payer: Medicaid Other | Admitting: Family Medicine

## 2019-10-25 VITALS — Wt 270.0 lb

## 2019-10-25 DIAGNOSIS — N393 Stress incontinence (female) (male): Secondary | ICD-10-CM | POA: Diagnosis not present

## 2019-10-25 DIAGNOSIS — N921 Excessive and frequent menstruation with irregular cycle: Secondary | ICD-10-CM | POA: Diagnosis not present

## 2019-10-25 DIAGNOSIS — Z975 Presence of (intrauterine) contraceptive device: Secondary | ICD-10-CM | POA: Diagnosis not present

## 2019-10-25 DIAGNOSIS — N898 Other specified noninflammatory disorders of vagina: Secondary | ICD-10-CM | POA: Insufficient documentation

## 2019-10-25 NOTE — Progress Notes (Signed)
Patient bleeding and cramping with IUD. Increased in cramping for several days. Patient also complaining of urinary incontinence has gotten worse. Kathrene Alu RN

## 2019-10-25 NOTE — Progress Notes (Signed)
   Subjective:    Patient ID: Michele Armstrong, female    DOB: May 04, 1986, 33 y.o.   MRN: [id number]  HPI Patient complaining of increased cramping with menses that are every month, but also having breakthrough bleeding and spotting with cramping between her cycles.   She also finds that her stress incontinence is worsening. Now wearing depends due to consistent leaking urine with laughing, sneezing, coughing, etc.   Review of Systems     Objective:   Physical Exam Vitals reviewed. Exam conducted with a chaperone present.  Constitutional:      Appearance: Normal appearance.  Abdominal:     General: Abdomen is flat.     Palpations: Abdomen is soft.     Hernia: There is no hernia in the left inguinal area or right inguinal area.  Genitourinary:    Exam position: Prone.     Labia:        Right: No rash, tenderness or lesion.        Left: No rash, tenderness or lesion.      Vagina: Normal. No signs of injury and foreign body. No vaginal discharge, erythema, tenderness or bleeding.     Cervix: Normal.     Lymphadenopathy:     Lower Body: No right inguinal adenopathy. No left inguinal adenopathy.  Skin:    Capillary Refill: Capillary refill takes less than 2 seconds.  Neurological:     Mental Status: She is alert.        Assessment & Plan:  1. Vaginal discharge - Cervicovaginal ancillary only( [place])  2. Breakthrough bleeding with IUD Check wet prep. If negative, add progesterone. Consider [place] if not improving.  3. Stress incontinence, female Pelvic floor PT. F/u in 3 months. If not improving, may need referral to Urogyn.

## 2019-10-28 LAB — CERVICOVAGINAL ANCILLARY ONLY
Bacterial Vaginitis (gardnerella): NEGATIVE
Candida Glabrata: NEGATIVE
Candida Vaginitis: NEGATIVE
Chlamydia: NEGATIVE
Comment: NEGATIVE
Comment: NEGATIVE
Comment: NEGATIVE
Comment: NEGATIVE
Comment: NORMAL
Neisseria Gonorrhea: NEGATIVE

## 2019-10-29 ENCOUNTER — Other Ambulatory Visit: Payer: Self-pay | Admitting: Family Medicine

## 2019-10-29 MED ORDER — MEDROXYPROGESTERONE ACETATE 10 MG PO TABS: 20.0000 mg | ORAL_TABLET | Freq: Every day | ORAL | 2 refills | Status: DC

## 2019-10-31 MED ORDER — MEDROXYPROGESTERONE ACETATE 10 MG PO TABS: 20.0000 mg | ORAL_TABLET | Freq: Every day | ORAL | 2 refills | Status: DC

## 2019-11-07 ENCOUNTER — Other Ambulatory Visit: Payer: Self-pay

## 2019-11-07 ENCOUNTER — Ambulatory Visit: Payer: Medicaid Other | Attending: Family Medicine | Admitting: Physical Therapy

## 2019-11-07 ENCOUNTER — Encounter: Payer: Self-pay | Admitting: Physical Therapy

## 2019-11-07 DIAGNOSIS — M6281 Muscle weakness (generalized): Secondary | ICD-10-CM | POA: Insufficient documentation

## 2019-11-07 DIAGNOSIS — R293 Abnormal posture: Secondary | ICD-10-CM | POA: Diagnosis present

## 2019-11-07 DIAGNOSIS — R279 Unspecified lack of coordination: Secondary | ICD-10-CM | POA: Diagnosis present

## 2019-11-07 NOTE — Patient Instructions (Signed)
About Pelvic Support Problems Pelvic Support Problems Explained Ligaments, muscles, and connective tissue normally hold your bladder, uterus, and other organs in their proper places in your pelvis. When these tissues become weak, a problem with pelvic support may result. Weak support can cause one or more of the pelvic organs to drop down into the vagina. An organ may even drop so far that is partially exposed outside the body.  Pelvic support problems are named by the change in the organ. The main types of pelvic support problems are:  . Cystocele: When the bladder drops down into your vagina.  . Enterocele: When your small intestine drops between your vagina and rectum.  . Rectocele: When your rectum bulges into the vaginal wall.  Marland Kitchen Uterine prolapse: When your uterus drops into your vagina.  . Vaginal prolapse: When the top part of the vagina begins to droop. This sometimes happens after a hysterectomy (removal of the uterus).  Causes Pelvic support problems can be caused by many conditions. They may begin after you give birth, especially if you had a large baby. During childbirth, the muscles and skin of the birth canal (vagina) are stretched and sometimes torn. They heal over time but are not always exactly the same. A long pushing stage of labor may also weaken these tissues as well as very rapid births as the tissues do not have time to stretch so they tear.  Also, after menopause, there are changes in the vaginal walls resulting from a decrease in estrogen. Estrogen helps to keep the tissues toned. Low levels of estrogen weaken the vaginal walls and may cause the bladder to shift from its normal position. As women get older, the loss of muscle tone and the relaxation of muscles may cause the uterus or other organs to drop.  Over time, conditions like chronic coughing, chronic constipation, doing a lot of heavy lifting, straining to pass stool, and obesity, can also weaken the pelvic support  muscles.  Diagnosing Pelvic Support Problems Your health care provider will ask about your symptoms and do a pelvic examination. Your provider may also do a rectal exam during your pelvic exam. Your provider may ask you to: 1. Bear down and push (like you are having a bowel movement) so he or she can see if your bladder or other part of your body protrudes into the vagina. 2. Contract the muscles of your pelvis to check the strength of your pelvic muscles.  3. Do several types of urine, nerve and muscle tests of the pelvis and around the bladder to see what type of treatment is best for you.   Symptoms Symptoms of pelvic support problems depend on the organ involved, but may include:  . urine leakage  . stain or fecal loss after a bowel movement . trouble having bowel movements  . ache in the lower abdomen, groin, or lower back  . bladder infection  . a feeling of heaviness, pulling, or fullness in the pelvis, or a feeling that something is falling out of the vagina  . an organ protruding from your vaginal opening  . feeling the need to support the organs or perineal area to empty bladder or bowels . painful sexual intercourse.  Many women feel pelvic pressure or trouble holding their urine immediately after childbirth. For some, these symptoms go away permanently, in others they return as they get older.  Treatment Options A prolapsed organ cannot repair itself. Contact your health care provider as soon as you notice symptoms  of a problem. Treatment depends on what the specific problem is and how far advanced it is.  . The symptoms caused by some pelvic support problems may simply be treated with changes in diet, medicine to soften the stool, weight loss, or avoiding strenuous activities. You may also do pelvic floor exercises to help strengthen your pelvic muscles.  . Some cases of prolapse may require a special support device made from plastic or rubber called a pessary that fits into the  vagina to support the uterus, vagina, or bladder. A pessary can also help women who leak urine when coughing, straining, or exercising. In mild cases, a tampon or vaginal diaphragm may be used instead of a pessary.  Talk to your doctor or health care provider about these options. . In serious cases, surgery may be needed to put the organs back into their proper place. The uterus may be removed because of the pressure it puts on the bladder.  Your doctor will know what surgery will be best for you. How can I prevent pelvic support problems?  You can help prevent pelvic support problems by:  . maintaining a healthy lifestyle  . continuing to do pelvic floor exercises after you deliver a baby  . maintaining a healthy weight  . avoiding a lot of heavy lifting and lifting with your legs (not from your waist)  treating constipation and avoid getting    About Cystocele Overview The pelvic organs, including the bladder, are normally supported by pelvic floor muscles and ligaments.   When these muscles and ligaments are stretched, weakened or torn, the wall between the bladder and the vagina sags or herniates causing a prolapse, sometimes called a cystocele. This condition may cause discomfort and problems with emptying the bladder.  It can be present in various stages.  Some people are not aware of the changes. Others may notice changes at the vaginal opening or a feeling of the bladder dropping outside the body. Causes of a Cystocele A cystocele is usually caused by muscle straining or stretching during childbirth.  In addition, cystocele is more common after menopause, because the hormone estrogen helps keep the elastic tissues around the pelvic organs strong.  A cystocele is more likely to occur when levels of estrogen decrease. Other causes include: heavy lifting, chronic coughing, previous pelvic surgery and obesity.  Symptoms A bladder that has dropped from its normal position may cause: unwanted  urine leakage (stress incontinence), frequent urination or urge to urinate, incomplete emptying of the bladder (not feeling bladder relief after emptying), pain or discomfort in the vagina, pelvis, groin, lower back or lower abdomen and frequent urinary tract infections.  Mild cases may not cause any symptoms.  Treatment Options . Pelvic floor (Kegel) exercises: Strength training the muscles in your genital area  . Behavioral changes: Treating and preventing constipation, taking time to empty your bladder properly, learning to lift properly and/or  avoid heavy lifting when possible, stopping smoking, avoiding weight gain and treating a chronic cough or bronchitis. . A pessary: A vaginal support device is sometimes used to help pelvic support caused by muscle and ligament changes. . Surgery: Aurgical repair may be necessary if symptoms cannot be managed with exercise, behavioral changes and a pessary. Surgery is usually considered for severe cases. .    Double Voiding can be a very useful technique to help overcome incomplete emptying of your bladder.  Incomplete emptying of urine can result in leakage after using the bathroom and increase the risk  of urinary tract infection.   Initial Void: When you first sit down to urinate, ensure optimal positioning for bladder emptying by following these guidelines for toileting posture: Sit on the toilet seat - don't hover over the seat Support your trunk by placing your hands on your knees or thighs Spread your knees and hips wide Position your feet flat on the floor or elevate feet on phone books, foot stool (Squatty Potty), or wrapped toilet paper rolls (if having knees above hips helps you empty) Lean forward from your hips Maintain the normal inward curve in your lower back   Repeated Void: After your initial void is complete, follow these movement patterns and attempt going to the bathroom again. Stand up Rotate your hips as if doing hula hoop in  one direction Rotate using the same action in the other direction Rock your hips and pelvis back and forwards ("pelvic tilts") Rock your hips and pelvis side to side ("tail wag") Sit back down and repeat your voiding technique This technique can be repeated as many times as you choose to help you empty your bladder more effectively.   Pelvic Floor Strengthening  We evaluated your pelvic floor muscle strength (how hard you can contract) and endurance (how long you can hold the muscles).    Just like other muscles in the body, these muscles can be exercised to gain strength and endurance to help with organ support and control of bladder and/or bowel leakage.  There are two types of exercises to work on:   Lift and hold:  Squeeze and lift your pelvic muscles and hold as long as you can before they become tired, up to 10 seconds at a time.  Focus on breathing while contracting the muscles and try to keep your thighs and buttock muscles relaxed.  The more you practice the longer you will be able to hold. Be sure to relax the muscles all the way between repetitions.  Give the muscles a break equal to or double the time you are able to contract and hold for. (Example, if you can hold for 4 seconds, rest 4-8 seconds before next repetition).    Your PT may have instructed you to perform these in a specific position for greater success.  You may also try these in a variety of positions if you feel confident in your ability to contract the muscles properly (laying on your back, laying on your side, on all 4s, sitting, standing, during transitional movements like sit to stand, or with exercise).   Quick flicks: Perform [id number] quick flicks each waking hour throughout the day, making sure to RELAX the muscles all the way in between each contraction.  If you do not feel like you can relax in between, or if you develop discomfort with repetitions, discontinue and let your PT know at your next visit.   We will  work together during your course of physical therapy to progress your pelvic floor strength.  You will see greater progress and success the more you practice at home.

## 2019-11-07 NOTE — Therapy (Signed)
[facility] [address], [place], [place], [address] Phone: [phone]   Fax:  [phone]  Physical Therapy Evaluation  Patient Details  Name: Michele Armstrong MRN: [id number] Date of Birth: 02-24-1986 Referring Provider (PT): Truett Mainland, DO   Encounter Date: 11/07/2019  PT End of Session - 11/07/19 1726    Visit Number  1    Date for PT Re-Evaluation  01/02/20    Authorization Type  Medicaid, submitting for authorization for initial 3 visits    PT Start Time  [id number]    PT Stop Time  1532    PT Time Calculation (min)  45 min    Activity Tolerance  Patient tolerated treatment well    Behavior During Therapy  [facility] for tasks assessed/performed       Past Medical History:  Diagnosis Date  . Anxiety   . Arrhythmia    tachycardia  . Depression   . Fatigue   . Headache(784.0)   . Hypothyroid   . Internal hemorrhoids   . PTSD (post-traumatic stress disorder)   . Seizures ([place])    Medication related seizure x 1    Past Surgical History:  Procedure Laterality Date  . CHOLECYSTECTOMY    . FLEXIBLE SIGMOIDOSCOPY N/A 06/01/2017   Procedure: FLEXIBLE SIGMOIDOSCOPY;  Surgeon: Milus Banister, MD;  Location: [facility];  Service: Endoscopy;  Laterality: N/A;  . HEMORRHOID BANDING  2018  . WISDOM TOOTH EXTRACTION      There were no vitals filed for this visit.   Subjective Assessment - 11/07/19 1448    Subjective  Pt had vaginal delivery in Feb 2018 and has had worsening SUI since then.  Using adult diapers daily and noticed increased urine amounts with coughing, sneezing, blowing nose, sit to stand on occassion.    Pertinent History  hemmorrhoids, history of constipation, rectal bleeding    Patient Stated Goals  not have to wear diapers    Currently in Pain?  No/denies         [facility] PT Assessment - 11/07/19 0001      Assessment   Medical Diagnosis  N39.3 (ICD-10-CM) - Stress incontinence, female    Referring Provider (PT)  Truett Mainland, DO    Onset Date/Surgical Date  --   2018   Next MD Visit  as needed      Precautions   Precaution Comments  Hx of patellar dislocation and ankle rolling      Balance Screen   Has the patient fallen in the past 6 months  No      [place] residence    Living Arrangements  Parent;Children      Prior Function   Level of Independence  Independent      Cognition   Overall Cognitive Status  Within Functional Limits for tasks assessed      Posture/Postural Control   Posture/Postural Control  Postural limitations    Postural Limitations  Increased lumbar lordosis    Posture Comments  posterior weight shift of trunk over pelvis to offset chest      ROM / Strength   AROM / PROM / Strength  AROM;Strength      AROM   Overall AROM Comments  trunk and hip ROM WNL, hypermobile hips bil rotation      Strength   Strength Assessment Site  Hip    Right/Left Hip  Right;Left    Right Hip Flexion  4/5  Right Hip Extension  4/5    Right Hip External Rotation   4/5    Right Hip Internal Rotation  4/5    Right Hip ABduction  4-/5    Right Hip ADduction  4-/5    Left Hip Flexion  4/5    Left Hip Extension  4/5    Left Hip External Rotation  4/5    Left Hip Internal Rotation  4/5    Left Hip ABduction  4-/5    Left Hip ADduction  4-/5      Palpation   SI assessment   WNL upon gross mobility testing    Palpation comment  no tenderness      Transfers   Transfers  Sit to Stand    Sit to Stand  With upper extremity assist    Comments  Pt needed coaching on bed mobility to come from supine to sit                Objective measurements completed on examination: See above findings.    Pelvic Floor Special Questions - 11/07/19 0001    Prior Pelvic/Prostate Exam  Yes    Date of Last Pelvic/Prostate Exam  --   last year   Result Pelvic/Prostate Exam   no    Are you Pregnant or attempting  pregnancy?  No    Prior Pregnancies  Yes    Number of Pregnancies  2    Number of Vaginal Deliveries  1    Any difficulty with labor and deliveries  Yes   Gr 1 tear, 2 stitches   Episiotomy Performed  No    Currently Sexually Active  No    History of sexually transmitted disease  No    Urinary Leakage  Yes    How often  20-100 times due to frequent nose blowing    Pad use  daily adult diapers    Activities that cause leaking  Coughing;Sneezing;Laughing;Exercising   nose blowing   Urinary urgency  No    Fecal incontinence  No    Caffeine beverages  0    Falling out feeling (prolapse)  No    Skin Integrity  Intact    Perineal Body/Introitus   Descended    External Palpation  non tender    Prolapse  Anterior Wall;Posterior Wall   Gr 2    Pelvic Floor Internal Exam  PT explained exam procedure and obtained verbal consent    Exam Type  Vaginal    Sensation  intact    Palpation  non-tender    Strength  Flicker   0/5 with initial effort, 1/5 with PT cue to exhale/adductor   Strength # of reps  1    Strength # of seconds  1                    PT Long Term Goals - 11/07/19 1747      PT LONG TERM GOAL #1   Title  Pt will be ind in HEP for walking program and pelvic floor, hip, and trunk strength to improve overall endurance and activity level for a more active and healthy lifestyle.    Baseline  no knowledge, sedentary lifestyle    Time  8    Period  Weeks    Status  New    Target Date  01/02/20      PT LONG TERM GOAL #2   Title  Pt will demo improved strategy with nose blowing to  reduce strain on pelvic floor and reduce leakage with this task by at least 50%.    Baseline  holds breath when blows nose, signif leakage with 100% of nose blows, blows nose up to 100 times a day (Pt reports OCD tendency with this)    Time  8    Period  Weeks    Status  New    Target Date  01/02/20      PT LONG TERM GOAL #3   Title  Pt will achieve at least 2/5 pelvic floor strength  and understand how to use the Knack strategy to reduce leakage with cough, sneeze, laughter, nose blowing.    Baseline  0/5 pelvic floor strength, no knowledge of The Knack    Time  8    Period  Weeks    Status  New    Target Date  01/02/20      PT LONG TERM GOAL #4   Title  Pt will achieve hip strength of at least 4+/5 bil to improve tasks such as transfers, stairs, and walking for exercise.    Baseline  hip strength ranges from 4-/5 to 4/5 bil, throughout    Time  8    Period  Weeks    Status  New    Target Date  01/02/20      PT LONG TERM GOAL #5   Title  Pt will report reduced use of adult diapers by at least 50% to improve her quality of life and confidence for dating and community activity.    Baseline  uses adult diapers 100% of the time, limits community activity and social outings due to embarassment    Time  8    Period  Weeks    Status  New    Target Date  01/02/20             Plan - 11/07/19 1727    Clinical Impression Statement  Pt is a 34yo female who reports worsening SUI since vaginal delivery in 2018.  She experiences leakage of medium amounts 20-100 times a day due to frequent nose blowing.  She notes leakage with coughing, sneezing, laughing and nose blowing.  She wears adult diapers full time.  She denies constipation currently although she has a history of constipation with hemmorrhoids which cause her some occassional bleeding rectally.  She has postural deficits, gross motor mobility WNL with hypermobility in bil hips, and weakness in bil hips.  She is non-tender throughout pelvic region.  No hemmorrhoids were observed today. She has 0/5 pelvic floor strength on initial efforts.  PT noted 1/5 with cues to exhale and perform adductor isometric squeeze.  PT also noted anterior and posterior vaginal wall prolapse, Gr II.  PT educated Pt on prolapse, double void technique, and intial efforts for PF contractions.  Pt will benefit from skilled PT to address deficits  and improve her overall quality of life.    Personal Factors and Comorbidities  Fitness;Comorbidity 1;Time since onset of injury/illness/exacerbation    Comorbidities  obesity, does not exercise, worsening SUI since 2018    Examination-Activity Limitations  Transfers;Bed Mobility;Continence;Hygiene/Grooming;Toileting    Examination-Participation Restrictions  Community Activity    Stability/Clinical Decision Making  Stable/Uncomplicated    Clinical Decision Making  Low    Rehab Potential  Good    PT Frequency  2x / week    PT Duration  8 weeks    PT Treatment/Interventions  ADLs/Self Care Home Management;Biofeedback;Electrical Stimulation;Neuromuscular re-education;Therapeutic exercise;Therapeutic activities;Functional mobility training;Patient/family  education;Manual techniques;Passive range of motion;Spinal Manipulations    PT Next Visit Plan  f/u on double void and PF contractions (exhale and ball squeeze supine), NuStep, standing posture, hip strength (adductors, abductors, glutes), look at how Pt blows nose    PT Home Exercise Plan  info on prolapse, cystocele, double void technique, pf contractions with exhale and ball squeeze    Consulted and Agree with Plan of Care  Patient       Patient will benefit from skilled therapeutic intervention in order to improve the following deficits and impairments:  Decreased endurance, Obesity, Decreased activity tolerance, Improper body mechanics, Decreased mobility, Decreased strength, Postural dysfunction  Visit Diagnosis: Muscle weakness (generalized) - Plan: PT plan of care cert/re-cert  Unspecified lack of coordination - Plan: PT plan of care cert/re-cert  Abnormal posture - Plan: PT plan of care cert/re-cert     Problem List Patient Active Problem List   Diagnosis Date Noted  . Stress incontinence, female 10/27/2017  . Prolapsed internal hemorrhoids, grade 2 06/07/2017  . Latex allergy 06/07/2017  . PTSD (post-traumatic stress  disorder) 08/22/2011  . Depression, major, recurrent ([place]) 08/22/2011  . Hypothyroid 08/22/2011    Anala Whisenant, PT 11/07/19 6:00 PM   [facility] [address], [place], [place], [address] Phone: [phone]   Fax:  [phone]  Name: Michele Armstrong MRN: [id number] Date of Birth: [birth year]

## 2019-11-19 ENCOUNTER — Ambulatory Visit: Payer: Medicaid Other | Admitting: Physical Therapy

## 2019-11-25 ENCOUNTER — Encounter: Payer: Medicaid Other | Admitting: Physical Therapy

## 2019-11-29 ENCOUNTER — Ambulatory Visit: Payer: Medicaid Other | Admitting: Physical Therapy

## 2019-11-29 ENCOUNTER — Encounter: Payer: Self-pay | Admitting: Physical Therapy

## 2019-11-29 ENCOUNTER — Other Ambulatory Visit: Payer: Self-pay

## 2019-11-29 DIAGNOSIS — R293 Abnormal posture: Secondary | ICD-10-CM

## 2019-11-29 DIAGNOSIS — M6281 Muscle weakness (generalized): Secondary | ICD-10-CM | POA: Diagnosis not present

## 2019-11-29 DIAGNOSIS — R279 Unspecified lack of coordination: Secondary | ICD-10-CM

## 2019-11-29 NOTE — Patient Instructions (Signed)
  CALL MEDICAID ABOUT FINANCIAL HELP FOR DIAPERS.   WALKING PROGRAM: Aim to walk at least five days a week.  Start out warming up with a five-minute, slower paced walk.  Slow your pace to cool down during the last five minutes of your walk. Start at a pace that's comfortable for you. Then gradually pick up speed until you're walking briskly -- generally about 3 to 4 miles an hour.  You should be breathing hard, but you should still be able to carry on a conversation.  Each week, add about two minutes to your walking time. You may choose to repeat a week for 2-3 weeks before advancing if you feel you need to.  12-Week Walking Program - [facility] Week Warm Up (min) Brisk Walking (min) Cool Down (min)  1 5 5 5  2 5 7 5  3 5 9 5  4 5 11 5  5 5 13 5  6 5 15 5  7 5 18 5  8 5 20 5  9 5 23 5  10 5 26 5  11 5 28 5  12 5 30 5    The Department of Health and Human Services recommends getting at least 150 min of moderate aerobic activity (30 min, 5 days a week) or 75 min of vigorous aerobic activity weekly.  Spread this activity out throughout the week.  Even small amounts of physical activity are helpful, and accumulated activity throughout the day adds up to provide health benefits.   http://hess-atkins.info/

## 2019-11-29 NOTE — Therapy (Signed)
[facility] [address], [place], [place], [address] Phone: [phone]   Fax:  [phone]  Physical Therapy Treatment  Patient Details  Name: Evaluna Willard MRN: [id number] Date of Birth: 30-Sep-1986 Referring Provider (PT): Truett Mainland, DO   Encounter Date: 11/29/2019  PT End of Session - 11/29/19 0847    Visit Number  2    Date for PT Re-Evaluation  01/02/20    Authorization Type  Medicaid    PT Start Time  0847    PT Stop Time  0929    PT Time Calculation (min)  42 min    Activity Tolerance  Patient tolerated treatment well    Behavior During Therapy  [facility] for tasks assessed/performed       Past Medical History:  Diagnosis Date  . Anxiety   . Arrhythmia    tachycardia  . Depression   . Fatigue   . Headache(784.0)   . Hypothyroid   . Internal hemorrhoids   . PTSD (post-traumatic stress disorder)   . Seizures ([place])    Medication related seizure x 1    Past Surgical History:  Procedure Laterality Date  . CHOLECYSTECTOMY    . FLEXIBLE SIGMOIDOSCOPY N/A 06/01/2017   Procedure: FLEXIBLE SIGMOIDOSCOPY;  Surgeon: Milus Banister, MD;  Location: [facility];  Service: Endoscopy;  Laterality: N/A;  . HEMORRHOID BANDING  2018  . WISDOM TOOTH EXTRACTION      There were no vitals filed for this visit.  Subjective Assessment - 11/29/19 0849    Subjective  I missed last week b/c I got sick.  I'm better now.  I may be figuring out my Kegels.  Double void has only worked once.    Pertinent History  hemmorrhoids, history of constipation, rectal bleeding    Patient Stated Goals  not have to wear diapers    Currently in Pain?  No/denies                       [facility] Adult PT Treatment/Exercise - 11/29/19 0001      Self-Care   Self-Care  Other Self-Care Comments    Other Self-Care Comments   The Knack, walking program (handout given), benefit of accessory muscle facil of PF, vulvar skin care  diaper wearing protocol      Neuro Re-ed    Neuro Re-ed Details   PF supine hooklying isolation and with accessory muscles for facil overflow 10x3-5 sec holds, Gr 1/5, quick flicks with glut squeeze x 10 reps      Exercises   Exercises  Knee/Hip      Knee/Hip Exercises: Standing   Hip Abduction  Stengthening;Both;2 sets;5 sets;Knee straight    Abduction Limitations  at counter    Hip Extension  Both;20 reps;Knee straight    Extension Limitations  toe taps, at counter    Other Standing Knee Exercises  marching at counter x 20, PT cued high knees and stand tall through stance hip, TrA      Knee/Hip Exercises: Seated   Sit to Sand  5 reps;without UE support      Knee/Hip Exercises: Supine   Other Supine Knee/Hip Exercises  hooklying glut sets and ball squeeze with PF 10x5 sec each             PT Education - 11/29/19 0910    Education Details  Access Code: [email or web address], walking program    Person(s) Educated  Patient    Methods  Explanation;Demonstration;Verbal cues;Handout    Comprehension  Verbalized understanding;Returned demonstration          PT Long Term Goals - 11/29/19 0903      PT LONG TERM GOAL #1   Title  Pt will be ind in HEP for walking program and pelvic floor, hip, and trunk strength to improve overall endurance and activity level for a more active and healthy lifestyle.    Baseline  building program    Status  On-going      PT LONG TERM GOAL #2   Title  Pt will demo improved strategy with nose blowing to reduce strain on pelvic floor and reduce leakage with this task by at least 50%.    Baseline  introduced idea of accessory muscle use and exhale without breath holding while nose blowing    Status  On-going            Plan - 11/29/19 1150    Clinical Impression Statement  Pt was able to perform a 1/5 contraction of pelvic floor today.  She benefits from use of accessory muscles including adductors and glutes.  PT updated HEP to include info on a  walking program, standing countertop routine and supine pelvic muscle strength with accessory muscle use.  PT discussed need to focus on skin health and changing diaper more frequently to avoid urine contact with skin.  Pt will continue to benefit from skilled PT along POC.  On track to meet initial goals.    Comorbidities  obesity, does not exercise, worsening SUI since 2018    PT Frequency  2x / week    PT Duration  8 weeks    PT Treatment/Interventions  ADLs/Self Care Home Management;Biofeedback;Electrical Stimulation;Neuromuscular re-education;Therapeutic exercise;Therapeutic activities;Functional mobility training;Patient/family education;Manual techniques;Passive range of motion;Spinal Manipulations    PT Next Visit Plan  f/u on walking program, PF contractions, standing ther ex, sit to stands, nose blow technique - has this allowed for less leakage?  Try NuStep    PT Home Exercise Plan  Access Code: [email or web address] + walking program    Consulted and Agree with Plan of Care  Patient       Patient will benefit from skilled therapeutic intervention in order to improve the following deficits and impairments:     Visit Diagnosis: Muscle weakness (generalized)  Unspecified lack of coordination  Abnormal posture     Problem List Patient Active Problem List   Diagnosis Date Noted  . Stress incontinence, female 10/27/2017  . Prolapsed internal hemorrhoids, grade 2 06/07/2017  . Latex allergy 06/07/2017  . PTSD (post-traumatic stress disorder) 08/22/2011  . Depression, major, recurrent ([place]) 08/22/2011  . Hypothyroid 08/22/2011    Baruch Merl, PT 11/29/19 11:54 AM   [facility] [address], [place], [place], [address] Phone: [phone]   Fax:  [phone]  Name: Sadee Ruppe MRN: [id number] Date of Birth: 27-Jul-1986

## 2019-12-03 ENCOUNTER — Encounter: Payer: Self-pay | Admitting: Physical Therapy

## 2019-12-03 ENCOUNTER — Ambulatory Visit: Payer: Medicaid Other | Attending: Family Medicine | Admitting: Physical Therapy

## 2019-12-03 ENCOUNTER — Other Ambulatory Visit: Payer: Self-pay

## 2019-12-03 DIAGNOSIS — R279 Unspecified lack of coordination: Secondary | ICD-10-CM | POA: Diagnosis present

## 2019-12-03 DIAGNOSIS — R293 Abnormal posture: Secondary | ICD-10-CM | POA: Diagnosis present

## 2019-12-03 DIAGNOSIS — M6281 Muscle weakness (generalized): Secondary | ICD-10-CM | POA: Diagnosis not present

## 2019-12-03 NOTE — Patient Instructions (Signed)
Bladder Irritants  Certain foods and beverages can be irritating to the bladder.  Avoiding these irritants may decrease your symptoms of urinary urgency, frequency or bladder pain.  Even reducing your intake can help with your symptoms.  Not everyone is sensitive to all bladder irritants, so you may consider focusing on one irritant at a time, removing or reducing your intake of that irritant for 7-10 days to see if this change helps your symptoms.  Water intake is also very important.  Below is a list of bladder irritants.  Drinks: alcohol, carbonated beverages, caffeinated beverages such as coffee and tea, citrus juices, apple juice, tomato juice  Foods: tomatoes and tomato based foods, spicy food, sugar and artificial sweeteners, vinegar, chocolate, raw onion, apples, citrus fruits, pineapple, cranberries, tomatoes, strawberries, plums, peaches, cantaloupe  Other: acidic urine (too concentrated) - see water intake info below  Substitutes you can try that are NOT irritating to the bladder: cooked onion, pears, papayas, sun-brewed decaf teas, watermelons, non-citrus herbal teas, apricots, kava and low-acid instant drinks (Postum).    WATER INTAKE: Remember to drink lots of water (aim for fluid intake of half your body weight with 2/3 of fluids being water).  You may be limiting fluids due to fear of leakage, but this can actually worsen urgency symptoms due to highly concentrated urine.  Water helps balance the pH of your urine so it doesn't become too acidic - acidic urine is a bladder irritant!

## 2019-12-03 NOTE — Therapy (Signed)
[facility] [address] [place], [place], [address] Phone: [phone]   Fax:  [phone]  Physical Therapy Treatment  Patient Details  Name: Michele Armstrong MRN: [id number] Date of Birth: 1986-08-09 Referring Provider (PT): Truett Mainland, DO   Encounter Date: 12/03/2019  PT End of Session - 12/03/19 1710    Visit Number  3    Date for PT Re-Evaluation  01/02/20    Authorization Type  Medicaid, submitting for renewal    Authorization - Visit Number  2    Authorization - Number of Visits  3    PT Start Time  1555   Pt 25 min late   PT Stop Time  1615    PT Time Calculation (min)  20 min    Activity Tolerance  Patient tolerated treatment well    Behavior During Therapy  [facility] for tasks assessed/performed       Past Medical History:  Diagnosis Date  . Anxiety   . Arrhythmia    tachycardia  . Depression   . Fatigue   . Headache(784.0)   . Hypothyroid   . Internal hemorrhoids   . PTSD (post-traumatic stress disorder)   . Seizures ([place])    Medication related seizure x 1    Past Surgical History:  Procedure Laterality Date  . CHOLECYSTECTOMY    . FLEXIBLE SIGMOIDOSCOPY N/A 06/01/2017   Procedure: FLEXIBLE SIGMOIDOSCOPY;  Surgeon: Milus Banister, MD;  Location: [facility];  Service: Endoscopy;  Laterality: N/A;  . HEMORRHOID BANDING  2018  . WISDOM TOOTH EXTRACTION      There were no vitals filed for this visit.  Subjective Assessment - 12/03/19 1551    Subjective  I'm using the nose blowing technique and the Knack you taught me and now I am only leaking 20% of the time with this activity.  I've done the walking program 2x since I was last here.  Overall 60-70% less urine leakage with all activities that used to cause leakage.  I am doing the HEP 3x/day and it is helping a lot.    Pertinent History  hemmorrhoids, history of constipation, rectal bleeding    Patient Stated Goals  not have to wear diapers     Currently in Pain?  No/denies                       [facility] Adult PT Treatment/Exercise - 12/03/19 0001      Self-Care   Self-Care  Other Self-Care Comments    Other Self-Care Comments   bladder diary info, bladder irritants, review of walking program and HEP                  PT Long Term Goals - 12/03/19 1556      PT LONG TERM GOAL #1   Title  Pt will be ind in HEP for walking program and pelvic floor, hip, and trunk strength to improve overall endurance and activity level for a more active and healthy lifestyle.    Baseline  started week 1 of 12 week walking program, doing HEP at countertop 3x/day, working on pelvic floor conraction timing to reduce leakage with activity    Status  On-going      PT LONG TERM GOAL #2   Title  Pt will demo improved strategy with nose blowing to reduce strain on pelvic floor and reduce leakage with this task by at least 50%.    Baseline  80% improved with nose blowing    Status  Achieved      PT LONG TERM GOAL #3   Title  Pt will achieve at least 2/5 pelvic floor strength and understand how to use the Knack strategy to reduce leakage with cough, sneeze, laughter, nose blowing.    Baseline  1/5 with some accessory muscle use to assist recruitment (gluts and adductors), using the Knack and reports 60% less leakage with all activities that previously caused consistent leakage    Status  On-going      PT LONG TERM GOAL #4   Title  Pt will achieve hip strength of at least 4+/5 bil to improve tasks such as transfers, stairs, and walking for exercise.    Baseline  4/5 throughout      PT LONG TERM GOAL #5   Title  Pt will report reduced use of adult diapers by at least 50% to improve her quality of life and confidence for dating and community activity.    Baseline  using diapers 100% of the time but noticing less leakage    Status  On-going      Additional Long Term Goals   Additional Long Term Goals  Yes      PT LONG TERM  GOAL #6   Title  Pt will fill out a 24 hour bladder and liquid intake diary for further understanding of leakage patterns.    Time  3    Period  Weeks    Status  New    Target Date  12/24/19            Plan - 12/03/19 1711    Clinical Impression Statement  Pt has made significant progress since starting PT.  She reports 80% improvement in leakage with nose blowing and 60-70% improvement in leakage with sneeze, cough and lift using the new techniques for nose-blowing and The Knack.  PF strength has improved from 0/5 to 1/5 in 3 visits.  She is compliant with HEP for LE strength 3x/day which she states is helping her feel more aware of these muscles and the pelvic floor muscles.  She has started a walking program which will build time each week, with PT encouraging walking 5x/week.  PT educated Pt on bladder irritants, water intake (Pt only drinking 2 cups/day), negative impact of concentrated urine on vulvar and pelvic skin health and on bladder urgency, and educated Pt on how to fill out a 24 hour bladder diary between now and next visit.  Short session today due to Pt being late.  Focus was on further self-care techniques, HEP review and Pt education.  Pt will continue to benefit from skilled PT to monitor progress and progress activity level and strength of PF and LEs for goal of improved continence and overall health.    Personal Factors and Comorbidities  Fitness;Comorbidity 1;Time since onset of injury/illness/exacerbation    Comorbidities  obesity, does not exercise, worsening SUI since 2018    Examination-Activity Limitations  Transfers;Bed Mobility;Continence;Hygiene/Grooming;Toileting    Examination-Participation Restrictions  Community Activity    Rehab Potential  Good    PT Frequency  Biweekly   stretch out appointments to allow for more HEP time to demo improving strength between visits   PT Duration  8 weeks    PT Treatment/Interventions  ADLs/Self Care Home  Management;Biofeedback;Electrical Stimulation;Neuromuscular re-education;Therapeutic exercise;Therapeutic activities;Functional mobility training;Patient/family education;Manual techniques;Passive range of motion;Spinal Manipulations    PT Next Visit Plan  f/u on HEP, walking program, did Pt  call Medicaid, did Pt do bladder diary?, NuStep, add hip ER, step ups, functional squats and lifting    PT Home Exercise Plan  Access Code: [email or web address] + walking program    Consulted and Agree with Plan of Care  Patient       Patient will benefit from skilled therapeutic intervention in order to improve the following deficits and impairments:  Decreased endurance, Obesity, Decreased activity tolerance, Improper body mechanics, Decreased mobility, Decreased strength, Postural dysfunction  Visit Diagnosis: Muscle weakness (generalized)  Unspecified lack of coordination  Abnormal posture     Problem List Patient Active Problem List   Diagnosis Date Noted  . Stress incontinence, female 10/27/2017  . Prolapsed internal hemorrhoids, grade 2 06/07/2017  . Latex allergy 06/07/2017  . PTSD (post-traumatic stress disorder) 08/22/2011  . Depression, major, recurrent ([place]) 08/22/2011  . Hypothyroid 08/22/2011    Kasem Mozer, PT 12/03/19 5:21 PM   [facility] [address] [place], [place], [address] Phone: [phone]   Fax:  [phone]  Name: Michele Armstrong MRN: [id number] Date of Birth: Sep 12, 1986

## 2019-12-18 ENCOUNTER — Ambulatory Visit: Payer: Medicaid Other | Admitting: Physical Therapy

## 2020-01-02 ENCOUNTER — Other Ambulatory Visit: Payer: Self-pay

## 2020-01-02 ENCOUNTER — Encounter: Payer: Self-pay | Admitting: Physical Therapy

## 2020-01-02 ENCOUNTER — Ambulatory Visit: Payer: Medicaid Other | Attending: Family Medicine | Admitting: Physical Therapy

## 2020-01-02 DIAGNOSIS — R293 Abnormal posture: Secondary | ICD-10-CM

## 2020-01-02 DIAGNOSIS — R279 Unspecified lack of coordination: Secondary | ICD-10-CM | POA: Insufficient documentation

## 2020-01-02 DIAGNOSIS — M6281 Muscle weakness (generalized): Secondary | ICD-10-CM | POA: Insufficient documentation

## 2020-01-02 NOTE — Therapy (Signed)
[facility] [address], [place], [place], [address] Phone: [phone]   Fax:  [phone]  Physical Therapy Treatment  Patient Details  Name: Michele Armstrong MRN: [id number] Date of Birth: [birth year] Referring Provider (PT): Truett Mainland, DO   Encounter Date: 01/02/2020  PT End of Session - 01/02/20 1641    Visit Number  4    Date for PT Re-Evaluation  01/02/20    Authorization Type  Medicaid    Authorization Time Period  12/18/19-02/11/20 for 4 visits    Authorization - Visit Number  1    Authorization - Number of Visits  4    PT Start Time  1535    PT Stop Time  1625    PT Time Calculation (min)  50 min    Activity Tolerance  Patient tolerated treatment well    Behavior During Therapy  [facility] for tasks assessed/performed       Past Medical History:  Diagnosis Date  . Anxiety   . Arrhythmia    tachycardia  . Depression   . Fatigue   . Headache(784.0)   . Hypothyroid   . Internal hemorrhoids   . PTSD (post-traumatic stress disorder)   . Seizures ([place])    Medication related seizure x 1    Past Surgical History:  Procedure Laterality Date  . CHOLECYSTECTOMY    . FLEXIBLE SIGMOIDOSCOPY N/A 06/01/2017   Procedure: FLEXIBLE SIGMOIDOSCOPY;  Surgeon: Milus Banister, MD;  Location: [facility];  Service: Endoscopy;  Laterality: N/A;  . HEMORRHOID BANDING  2018  . WISDOM TOOTH EXTRACTION      There were no vitals filed for this visit.  Subjective Assessment - 01/02/20 1539    Subjective  Pt has been having extensive work up since last visit due to ongoing nausea and vomiting and vertigo.  All stool cultures and stomach cultures were negative, colonoscopy and endoscopy have been done.  N[place]au and vomiting aren't as bad this week and vertigo has much improved.    Pertinent History  hemmorrhoids, history of constipation, rectal bleeding, ongoing naseau/vomiting with workup with GI specialist ongoing    Patient Stated Goals  not have to wear diapers    Currently in Pain?  No/denies         [facility] PT Assessment - 01/02/20 0001      Assessment   Medical Diagnosis  N39.3 (ICD-10-CM) - Stress incontinence, female    Referring Provider (PT)  Truett Mainland, DO    Onset Date/Surgical Date  --   2018   Next MD Visit  as needed      AROM   Overall AROM Comments  trunk and hip ROM WNL, hypermobile hips bil rotation      Strength   Overall Strength Comments  hip strength 5/5 bil, trunk 4/5      Palpation   SI assessment   WNL upon gross mobility testing    Palpation comment  no tenderness      Transfers   Transfers  Sit to Stand    Sit to Stand  With upper extremity assist                Pelvic Floor Special Questions - 01/02/20 0001    Urinary Leakage  Yes    How often  all day, 80% improved control with nose blowing and transfers    Activities that cause leaking  Coughing;Laughing;Sneezing    Urinary urgency  No    Strength  Flicker    Strength # of reps  3    Strength # of seconds  7        [facility] Adult PT Treatment/Exercise - 01/02/20 0001      Neuro Re-ed    Neuro Re-ed Details   towel roll sitting for seated row and shoulder ext with red band for biofeedback, PF supine 5x10 sec holds, PT cued imagine vagina closing around straw as though drinking a milkshake, quick flicks with accessory muscle use adductors and gluts in posterior pelvic tilt x 10      Exercises   Exercises  Lumbar;Shoulder      Lumbar Exercises: Standing   Other Standing Lumbar Exercises  red band walkouts fwd/bwd elbows bent, PT cued posture alignment and TrA/PF 5x5 sec each      Lumbar Exercises: Seated   Long Arc Quad on Chair  Strengthening;Both;2 sets;10 reps    LAQ on Chair Limitations  one set seated on dynadisc, 2nd set with green band around ankles (no disc)      Knee/Hip Exercises: Seated   Marching  Strengthening;Both;1 set;10 reps    Marching Limitations  green band around  knees    Abduction/Adduction   Strengthening;Both;1 set;10 reps    Abd/Adduction Limitations  green band seated clam      Shoulder Exercises: Seated   Extension  Strengthening;Both;10 reps;Theraband    Theraband Level (Shoulder Extension)  Level 2 (Red)    Extension Limitations  seated on towel roll for biofeedback    Row  Strengthening;Both;10 reps;Theraband    Theraband Level (Shoulder Row)  Level 2 (Red)    Row Limitations  seated on towel roll for PF biofeedback                  PT Long Term Goals - 01/02/20 1542      PT LONG TERM GOAL #1   Title  Pt will be ind in HEP for walking program and pelvic floor, hip, and trunk strength to improve overall endurance and activity level for a more active and healthy lifestyle.    Baseline  due to medical complications hasn't been walking, doing HEP, working on PF contractions, leaks signif when vomiting    Time  8    Period  Weeks    Status  On-going    Target Date  02/27/20      PT LONG TERM GOAL #2   Title  Pt will demo improved strategy with nose blowing to reduce strain on pelvic floor and reduce leakage with this task by at least 50%.    Baseline  80% improved with nose blowing, pollen is now causing coughing and more nose blowing    Time  8    Period  Weeks    Status  On-going    Target Date  02/27/20      PT LONG TERM GOAL #3   Title  Pt will achieve at least 2/5 pelvic floor strength and understand how to use the Knack strategy to reduce leakage with cough, sneeze, laughter, nose blowing.    Baseline  1/5 with some accessory muscle use to assist recruitment (gluts and adductors), using the Knack and reports 60% less leakage with all activities that previously caused consistent leakage    Time  8    Period  Weeks    Status  On-going    Target Date  02/27/20      PT LONG TERM GOAL #4   Title  Pt will  achieve hip strength of at least 4+/5 bil to improve tasks such as transfers, stairs, and walking for exercise.     Baseline  5/5 throughout    Period  Weeks    Status  Achieved      PT LONG TERM GOAL #5   Title  Pt will report reduced use of adult diapers by at least 50% to improve her quality of life and confidence for dating and community activity.    Baseline  using diapers 100% of the time but noticing less leakage    Time  8    Period  Weeks    Status  On-going    Target Date  02/27/20      PT LONG TERM GOAL #6   Title  Pt will fill out a 24 hour bladder and liquid intake diary for further understanding of leakage patterns.    Status  On-going            Plan - 01/02/20 1642    Clinical Impression Statement  Pt has been experiencing ongoing nausea, vomiting and vertigo for which she has had extensive work up since last PT visit.  She feels she has not made as much progress with her leakage/pelvic floor due to medical complications.  She reports 80% improved leakage with nose blowing throughout the day using the Knack strategy.  She has ongoing significant leakage with vomiting and has started coughing due to increased allergies which also causes leakage.  She wears adult diapers 24/7.  Current PF strength is 1/5 with more recruitment of posterior and Lt pelvic floor than anterior and Rt aspects.  She uses accessory muscles of adductors and gluts to assist PF.  Her hip strength has improved significantly with HEP, scoring a 5/5 for all hip muscle groups today.  PT progressed HEP to include trunk strength and stability in sitting on towel roll under perineal area for biofeedback of PF during ther ex.  Pt had increased awareness of TrA and PF co-recruitment seated on towel and was able to coordinate exhale on effort with ther ex.  She will continue to benefit from skilled PT along POC to improve trunk, core and PF strength and coordination for improved urinary incontinence.    Personal Factors and Comorbidities  Fitness;Comorbidity 1;Time since onset of injury/illness/exacerbation    Comorbidities   obesity, does not exercise, worsening SUI since 2018    Examination-Activity Limitations  Transfers;Bed Mobility;Continence;Hygiene/Grooming;Toileting    Examination-Participation Restrictions  Community Activity    Stability/Clinical Decision Making  Stable/Uncomplicated    Clinical Decision Making  Low    Rehab Potential  Good    PT Frequency  Biweekly    PT Duration  8 weeks    PT Treatment/Interventions  ADLs/Self Care Home Management;Biofeedback;Electrical Stimulation;Neuromuscular re-education;Therapeutic exercise;Therapeutic activities;Functional mobility training;Patient/family education;Manual techniques;Passive range of motion;Spinal Manipulations    PT Next Visit Plan  f/u on updated HEP (seated on towel UE and LE ther ex), continue to target anterior pelvic floor (straw milkshake cue), NuStep and UBE for endurance    PT Home Exercise Plan  Access Code: [email or web address] + walking program    Consulted and Agree with Plan of Care  Patient       Patient will benefit from skilled therapeutic intervention in order to improve the following deficits and impairments:  Decreased endurance, Obesity, Decreased activity tolerance, Improper body mechanics, Decreased mobility, Decreased strength, Postural dysfunction  Visit Diagnosis: Muscle weakness (generalized) - Plan: PT plan of care cert/re-cert  Unspecified lack  of coordination - Plan: PT plan of care cert/re-cert  Abnormal posture - Plan: PT plan of care cert/re-cert     Problem List Patient Active Problem List   Diagnosis Date Noted  . Stress incontinence, female 10/27/2017  . Prolapsed internal hemorrhoids, grade 2 06/07/2017  . Latex allergy 06/07/2017  . PTSD (post-traumatic stress disorder) 08/22/2011  . Depression, major, recurrent ([place]) 08/22/2011  . Hypothyroid 08/22/2011    Latanga Nedrow, PT 01/02/20 5:01 PM   [facility] [address] [place],  [place], [address] Phone: [phone]   Fax:  [phone]  Name: Michele Armstrong MRN: [id number] Date of Birth: 1986-02-12

## 2020-01-14 ENCOUNTER — Encounter: Payer: Self-pay | Admitting: Physical Therapy

## 2020-01-14 ENCOUNTER — Ambulatory Visit: Payer: Medicaid Other | Admitting: Physical Therapy

## 2020-01-14 ENCOUNTER — Other Ambulatory Visit: Payer: Self-pay

## 2020-01-14 DIAGNOSIS — R279 Unspecified lack of coordination: Secondary | ICD-10-CM

## 2020-01-14 DIAGNOSIS — M6281 Muscle weakness (generalized): Secondary | ICD-10-CM | POA: Diagnosis not present

## 2020-01-14 DIAGNOSIS — R293 Abnormal posture: Secondary | ICD-10-CM

## 2020-01-14 NOTE — Patient Instructions (Signed)
Urge Incontinence  . Ideal urination frequency is every 2-4 wakeful hours, which equates to 5-8 times within a 24-hour period.   . Urge incontinence is leakage that occurs when the bladder muscle contracts, creating a sudden need to go before getting to the bathroom.   . Going too often when your bladder isn't actually full can disrupt the body's automatic signals to store and hold urine longer, which will increase urgency/frequency.  In this case, the bladder "is running the show" and strategies can be learned to retrain this pattern.   . One should be able to control the first urge to urinate, at around 171mL.  The bladder can hold up to a "grande latte," or 458mL. . To help you gain control, practice the Urge Drill below when urgency strikes.  This drill will help retrain your bladder signals and allow you to store and hold urine longer.  The overall goal is to stretch out your time between voids to reach a more manageable voiding schedule.    . Practice your "quick flicks" often throughout the day (each waking hour) even when you don't need feel the urge to go.  This will help strengthen your pelvic floor muscles, making them more effective in controlling leakage.  Urge Drill  When you feel an urge to go, follow these steps to regain control: 1. Stop what you are doing and be still 2. Take one deep breath, directing your air into your abdomen 3. Think an affirming thought, such as "I've got this." 4. Do 5 quick flicks of your pelvic floor 5. Walk with control to the bathroom to void, or delay voiding     The "Pelvic Drop" to Release Pelvic Floor Tension: Three Visualizations     Guided Meditation for Pelvic Floor Relaxation  [employer]   Pelvic Floor Release Stretches  [employer]    Pelvic Floor Release Stretches (NEW)  [employer]

## 2020-01-14 NOTE — Therapy (Signed)
[facility] [address], [place], [place], [address] Phone: [phone]   Fax:  [phone]  Physical Therapy Treatment  Patient Details  Name: Michele Armstrong MRN: [id number] Date of Birth: [birth year] Referring Provider (PT): Truett Mainland, DO   Encounter Date: 01/14/2020  PT End of Session - 01/14/20 1620    Visit Number  5    Date for PT Re-Evaluation  02/27/20    Authorization Type  Medicaid    Authorization Time Period  12/18/19-02/11/20 for 4 visits    Authorization - Visit Number  2    Authorization - Number of Visits  4    PT Start Time  [id number]    PT Stop Time  1615    PT Time Calculation (min)  45 min    Activity Tolerance  Patient tolerated treatment well    Behavior During Therapy  [facility] for tasks assessed/performed       Past Medical History:  Diagnosis Date  . Anxiety   . Arrhythmia    tachycardia  . Depression   . Fatigue   . Headache(784.0)   . Hypothyroid   . Internal hemorrhoids   . PTSD (post-traumatic stress disorder)   . Seizures ([place])    Medication related seizure x 1    Past Surgical History:  Procedure Laterality Date  . CHOLECYSTECTOMY    . FLEXIBLE SIGMOIDOSCOPY N/A 06/01/2017   Procedure: FLEXIBLE SIGMOIDOSCOPY;  Surgeon: Milus Banister, MD;  Location: [facility];  Service: Endoscopy;  Laterality: N/A;  . HEMORRHOID BANDING  2018  . WISDOM TOOTH EXTRACTION      There were no vitals filed for this visit.  Subjective Assessment - 01/14/20 1536    Subjective  I am very naseous and dizzy with vertigo right now.  I haven't been feeling well all week.  I have tried to do some of my walking and strengthening.  I am no longer leaking all day but just with some sit to stand, cough, sneeze and at night as I try to get to toilet.    Pertinent History  hemmorrhoids, history of constipation, rectal bleeding, ongoing naseau/vomiting with workup with GI specialist ongoing    Patient  Stated Goals  not have to wear diapers    Currently in Pain?  No/denies                       [facility] Adult PT Treatment/Exercise - 01/14/20 0001      Self-Care   Self-Care  Other Self-Care Comments    Other Self-Care Comments   pelvic meditation, general meditation for anxiety/stress reduction, Urge drill for nighttime urgency, reduce fluid intake 2 hours before bedtime      Neuro Re-ed    Neuro Re-ed Details   towel roll sitting for all seated ther ex with PT cueing breath control, PF and TrA contraction      Lumbar Exercises: Seated   Long Arc Quad on Chair  Strengthening;Both;10 reps;1 set    LAQ on Chair Limitations  green band around ankles      Knee/Hip Exercises: Seated   Ball Squeeze  5x5 with PF/TrA    Marching  Strengthening;Both;15 reps    Marching Limitations  green band, PT cued keep trunk centered    Abduction/Adduction   Strengthening;Both;1 set;15 reps    Abd/Adduction Limitations  green band seated clam    Sit to Sand  5 reps;with UE support  PT Long Term Goals - 01/02/20 1542      PT LONG TERM GOAL #1   Title  Pt will be ind in HEP for walking program and pelvic floor, hip, and trunk strength to improve overall endurance and activity level for a more active and healthy lifestyle.    Baseline  due to medical complications hasn't been walking, doing HEP, working on PF contractions, leaks signif when vomiting    Time  8    Period  Weeks    Status  On-going    Target Date  02/27/20      PT LONG TERM GOAL #2   Title  Pt will demo improved strategy with nose blowing to reduce strain on pelvic floor and reduce leakage with this task by at least 50%.    Baseline  80% improved with nose blowing, pollen is now causing coughing and more nose blowing    Time  8    Period  Weeks    Status  On-going    Target Date  02/27/20      PT LONG TERM GOAL #3   Title  Pt will achieve at least 2/5 pelvic floor strength and understand  how to use the Knack strategy to reduce leakage with cough, sneeze, laughter, nose blowing.    Baseline  1/5 with some accessory muscle use to assist recruitment (gluts and adductors), using the Knack and reports 60% less leakage with all activities that previously caused consistent leakage    Time  8    Period  Weeks    Status  On-going    Target Date  02/27/20      PT LONG TERM GOAL #4   Title  Pt will achieve hip strength of at least 4+/5 bil to improve tasks such as transfers, stairs, and walking for exercise.    Baseline  5/5 throughout    Period  Weeks    Status  Achieved      PT LONG TERM GOAL #5   Title  Pt will report reduced use of adult diapers by at least 50% to improve her quality of life and confidence for dating and community activity.    Baseline  using diapers 100% of the time but noticing less leakage    Time  8    Period  Weeks    Status  On-going    Target Date  02/27/20      PT LONG TERM GOAL #6   Title  Pt will fill out a 24 hour bladder and liquid intake diary for further understanding of leakage patterns.    Status  On-going            Plan - 01/14/20 1620    Clinical Impression Statement  Pt arrived feeling unwell (n[place], dizziness) but improved during Pt education/self-care instruction and was able to tolerate seated ther ex review from last visit.  Pt reports she is no longer leaking all day but rather with sit to stand transitions occassionally, and with cough, sneeze and nighttime voiding as she tries to get to toilet.  PT gave urge drill and suggestion of avoiding fluids 2 hours before bedtime for night continence and control.  PT emphasized breath control for improved deep core canister and PF recruitment during concentric seated exercise and with sit to stand.  PT also recommended guided meditation videos to help calm CNS and see if this improves gut health/symptoms given her admitted high levels of anxiety and stress.  PT discussed with  Pt continuing  with PT as long as she is medically stable and able to attend and participate in therapy along lines of goal targets. Re-eval next visit.    Comorbidities  obesity, does not exercise, worsening SUI since 2018    Rehab Potential  Good    PT Frequency  Biweekly    PT Duration  8 weeks    PT Treatment/Interventions  ADLs/Self Care Home Management;Biofeedback;Electrical Stimulation;Neuromuscular re-education;Therapeutic exercise;Therapeutic activities;Functional mobility training;Patient/family education;Manual techniques;Passive range of motion;Spinal Manipulations    PT Next Visit Plan  re-eval for Medicaid renewal, f/u on meditation videos, urge drill for night void and reduction of fluids before bedtime, review goals, PF re-check internal, progress neuro re-ed/ther ex as able    PT Home Exercise Plan  Access Code: [email or web address] + walking program    Consulted and Agree with Plan of Care  Patient       Patient will benefit from skilled therapeutic intervention in order to improve the following deficits and impairments:     Visit Diagnosis: Muscle weakness (generalized)  Unspecified lack of coordination  Abnormal posture     Problem List Patient Active Problem List   Diagnosis Date Noted  . Stress incontinence, female 10/27/2017  . Prolapsed internal hemorrhoids, grade 2 06/07/2017  . Latex allergy 06/07/2017  . PTSD (post-traumatic stress disorder) 08/22/2011  . Depression, major, recurrent ([place]) 08/22/2011  . Hypothyroid 08/22/2011    Baruch Merl, PT 01/14/20 4:27 PM   [facility] [address] [place], [place], [address] Phone: [phone]   Fax:  [phone]  Name: Krystie Rease MRN: [id number] Date of Birth: Jun 22, 1986

## 2020-01-28 ENCOUNTER — Ambulatory Visit: Payer: Medicaid Other | Admitting: Physical Therapy

## 2020-01-28 ENCOUNTER — Encounter: Payer: Self-pay | Admitting: Physical Therapy

## 2020-01-28 ENCOUNTER — Other Ambulatory Visit: Payer: Self-pay

## 2020-01-28 DIAGNOSIS — R279 Unspecified lack of coordination: Secondary | ICD-10-CM

## 2020-01-28 DIAGNOSIS — M6281 Muscle weakness (generalized): Secondary | ICD-10-CM

## 2020-01-28 NOTE — Therapy (Signed)
[facility] [address] [place], [place], [address] Phone: [phone]   Fax:  [phone]  Physical Therapy Treatment  Patient Details  Name: Michele Armstrong MRN: [id number] Date of Birth: Dec 01, 1985 Referring Provider (PT): Truett Mainland, DO   Encounter Date: 01/28/2020  PT End of Session - 01/28/20 1601    Visit Number  6    Date for PT Re-Evaluation  02/27/20    Authorization Type  Medicaid    Authorization Time Period  12/18/19-02/11/20 for 4 visits    Authorization - Visit Number  3    Authorization - Number of Visits  4    PT Start Time  [id number]   Pt late   PT Stop Time  1620    PT Time Calculation (min)  43 min    Activity Tolerance  Patient tolerated treatment well    Behavior During Therapy  [facility] for tasks assessed/performed       Past Medical History:  Diagnosis Date  . Anxiety   . Arrhythmia    tachycardia  . Depression   . Fatigue   . Headache(784.0)   . Hypothyroid   . Internal hemorrhoids   . PTSD (post-traumatic stress disorder)   . Seizures ([place])    Medication related seizure x 1    Past Surgical History:  Procedure Laterality Date  . CHOLECYSTECTOMY    . FLEXIBLE SIGMOIDOSCOPY N/A 06/01/2017   Procedure: FLEXIBLE SIGMOIDOSCOPY;  Surgeon: Milus Banister, MD;  Location: [facility];  Service: Endoscopy;  Laterality: N/A;  . HEMORRHOID BANDING  2018  . WISDOM TOOTH EXTRACTION      There were no vitals filed for this visit.  Subjective Assessment - 01/28/20 1544    Subjective  I did cut my fluids before bedtime but it didn't make much difference - I still had to get up in the night to use the bathroom but later - 4am vs 2am.  I have been more successful geting to bathroom at night without leakage. I have lost 13lbs focusing on smaller portions.    Pertinent History  hemmorrhoids, history of constipation, rectal bleeding, ongoing naseau/vomiting with workup with GI specialist ongoing     Patient Stated Goals  not have to wear diapers    Currently in Pain?  No/denies                       [facility] Adult PT Treatment/Exercise - 01/28/20 0001      Neuro Re-ed    Neuro Re-ed Details   supine PF contractions with PT providing internal quick stretch and tapping Rt/Lt and VCs to facil improved anterior PF recruitment      Lumbar Exercises: Aerobic   Nustep  L4 x 5', PT present to review progress      Lumbar Exercises: Standing   Other Standing Lumbar Exercises  march on foam x 20 reps 2# ankle weights      Knee/Hip Exercises: Standing   Hip Abduction  Stengthening;Both;10 reps;Knee straight    Abduction Limitations  2# ankle weights, on foam    Hip Extension  Stengthening;Both;10 reps;Knee straight    Extension Limitations  2# ankle weights, on foam      Knee/Hip Exercises: Seated   Long Arc Quad  Strengthening;Both;2 sets;5 sets    Long Arc Quad Limitations  seated on dynadisc    Sit to [employer]  10 reps   holding 5lb weight plate at chest, PT cued PF  Shoulder Exercises: Seated   Flexion  Strengthening;Right;Left;Both;5 reps;Weights    Flexion Weight (lbs)  5lb, seated on dynadisc, bil, Rt, Lt x 5 cycles                  PT Long Term Goals - 01/02/20 1542      PT LONG TERM GOAL #1   Title  Pt will be ind in HEP for walking program and pelvic floor, hip, and trunk strength to improve overall endurance and activity level for a more active and healthy lifestyle.    Baseline  due to medical complications hasn't been walking, doing HEP, working on PF contractions, leaks signif when vomiting    Time  8    Period  Weeks    Status  On-going    Target Date  02/27/20      PT LONG TERM GOAL #2   Title  Pt will demo improved strategy with nose blowing to reduce strain on pelvic floor and reduce leakage with this task by at least 50%.    Baseline  80% improved with nose blowing, pollen is now causing coughing and more nose blowing    Time  8     Period  Weeks    Status  On-going    Target Date  02/27/20      PT LONG TERM GOAL #3   Title  Pt will achieve at least 2/5 pelvic floor strength and understand how to use the Knack strategy to reduce leakage with cough, sneeze, laughter, nose blowing.    Baseline  1/5 with some accessory muscle use to assist recruitment (gluts and adductors), using the Knack and reports 60% less leakage with all activities that previously caused consistent leakage    Time  8    Period  Weeks    Status  On-going    Target Date  02/27/20      PT LONG TERM GOAL #4   Title  Pt will achieve hip strength of at least 4+/5 bil to improve tasks such as transfers, stairs, and walking for exercise.    Baseline  5/5 throughout    Period  Weeks    Status  Achieved      PT LONG TERM GOAL #5   Title  Pt will report reduced use of adult diapers by at least 50% to improve her quality of life and confidence for dating and community activity.    Baseline  using diapers 100% of the time but noticing less leakage    Time  8    Period  Weeks    Status  On-going    Target Date  02/27/20      PT LONG TERM GOAL #6   Title  Pt will fill out a 24 hour bladder and liquid intake diary for further understanding of leakage patterns.    Status  On-going            Plan - 01/28/20 1711    Clinical Impression Statement  Pt with report of improved nighttime continence.  She has lost 13lb with portion control focus.  She reports being under much stress and hasn't been as active as she'd like with her HEP/walking program.  PT noted improving endurance as she tolerated more ther ex today for LE strength with core/balance focus.  PF strength continues to be 1/5 with use of accessory muscles.  PT gave info on e-stim units today for Pt to review to see if she'd like to purchase to  aid her PF recruitment.  She will continue to benefit from skilled PT along POC.    Comorbidities  obesity, does not exercise, worsening SUI since 2018     Rehab Potential  Good    PT Frequency  Biweekly    PT Duration  8 weeks    PT Treatment/Interventions  ADLs/Self Care Home Management;Biofeedback;Electrical Stimulation;Neuromuscular re-education;Therapeutic exercise;Therapeutic activities;Functional mobility training;Patient/family education;Manual techniques;Passive range of motion;Spinal Manipulations    PT Next Visit Plan  re-eval for Medicaid, e-stim f/u on info given last visit, PF re-check, review goals, continue deep core training with more focus on TrA to compliment PF    PT Home Exercise Plan  Access Code: [email or web address] + walking program    Consulted and Agree with Plan of Care  Patient       Patient will benefit from skilled therapeutic intervention in order to improve the following deficits and impairments:     Visit Diagnosis: Muscle weakness (generalized)  Unspecified lack of coordination     Problem List Patient Active Problem List   Diagnosis Date Noted  . Stress incontinence, female 10/27/2017  . Prolapsed internal hemorrhoids, grade 2 06/07/2017  . Latex allergy 06/07/2017  . PTSD (post-traumatic stress disorder) 08/22/2011  . Depression, major, recurrent ([place]) 08/22/2011  . Hypothyroid 08/22/2011    Aishah Teffeteller, PT 01/28/20 5:17 PM   [facility] [address] [place], [place], [address] Phone: [phone]   Fax:  [phone]  Name: Michele Armstrong MRN: [id number] Date of Birth: 02/11/1986

## 2020-02-11 ENCOUNTER — Ambulatory Visit: Payer: Medicaid Other | Attending: Family Medicine | Admitting: Physical Therapy

## 2020-02-11 ENCOUNTER — Encounter: Payer: Self-pay | Admitting: Physical Therapy

## 2020-02-11 ENCOUNTER — Other Ambulatory Visit: Payer: Self-pay

## 2020-02-11 DIAGNOSIS — M6281 Muscle weakness (generalized): Secondary | ICD-10-CM | POA: Diagnosis not present

## 2020-02-11 DIAGNOSIS — R279 Unspecified lack of coordination: Secondary | ICD-10-CM | POA: Diagnosis present

## 2020-02-11 DIAGNOSIS — R293 Abnormal posture: Secondary | ICD-10-CM | POA: Diagnosis present

## 2020-02-11 NOTE — Therapy (Signed)
[facility] [address], [place], [place], [address] Phone: [phone]   Fax:  [phone]  Physical Therapy Treatment  Patient Details  Name: Michele Armstrong MRN: [id number] Date of Birth: 09-21-1986 Referring Provider (PT): Truett Mainland, DO   Encounter Date: 02/11/2020  PT End of Session - 02/11/20 1442    Visit Number  7    Date for PT Re-Evaluation  02/27/20    Authorization Type  Medicaid    Authorization Time Period  12/18/19-02/11/20 for 4 visits    Authorization - Visit Number  4    Authorization - Number of Visits  4    PT Start Time  1400    PT Stop Time  8366    PT Time Calculation (min)  42 min    Activity Tolerance  Patient tolerated treatment well    Behavior During Therapy  [facility] for tasks assessed/performed       Past Medical History:  Diagnosis Date  . Anxiety   . Arrhythmia    tachycardia  . Depression   . Fatigue   . Headache(784.0)   . Hypothyroid   . Internal hemorrhoids   . PTSD (post-traumatic stress disorder)   . Seizures ([place])    Medication related seizure x 1    Past Surgical History:  Procedure Laterality Date  . CHOLECYSTECTOMY    . FLEXIBLE SIGMOIDOSCOPY N/A 06/01/2017   Procedure: FLEXIBLE SIGMOIDOSCOPY;  Surgeon: Milus Banister, MD;  Location: [facility];  Service: Endoscopy;  Laterality: N/A;  . HEMORRHOID BANDING  2018  . WISDOM TOOTH EXTRACTION      There were no vitals filed for this visit.  Subjective Assessment - 02/11/20 1403    Subjective  I leak when I transition from sit to stand about 50% of the time.  I feel like I've gone backwards with my progress for continence.  I even wake up having leaked in my sleep sometimes.  My overall health is much better.  Less vomiting.  Ongoing dizziness with position transitions so I have to be careful.  I continue to lose weight with controlling my portions.    Pertinent History  hemmorrhoids, history of constipation,  rectal bleeding, ongoing naseau/vomiting with workup with GI specialist ongoing, recent history of vertigo with lingering symptoms    Patient Stated Goals  not have to wear diapers    Currently in Pain?  No/denies         [facility] PT Assessment - 02/11/20 0001      Assessment   Medical Diagnosis  N39.3 (ICD-10-CM) - Stress incontinence, female    Referring Provider (PT)  Truett Mainland, DO    Onset Date/Surgical Date  --   2018   Next MD Visit  as needed                   [facility] Adult PT Treatment/Exercise - 02/11/20 0001      Neuro Re-ed    Neuro Re-ed Details   quadruped and SL PF contractions with TrA      Exercises   Exercises  Lumbar      Lumbar Exercises: Aerobic   Nustep  L4 x 6', PT present to discuss symptoms and review goals             PT Education - 02/11/20 1441    Education Details  reviewed HEP, provided 2nd copy of estim info, quadruped or counter plank TrA and PF co-contraction up to 80-100  times/day    Person(s) Educated  Patient    Methods  Explanation    Comprehension  Verbalized understanding;Returned demonstration          PT Long Term Goals - 02/11/20 1406      PT LONG TERM GOAL #1   Title  Pt will be ind in HEP for walking program and pelvic floor, hip, and trunk strength to improve overall endurance and activity level for a more active and healthy lifestyle.    Baseline  doing walking with daily activities vs walking for exercise due to demands of raising son, doing HEP from PT 6-7/days a week, has lost 13-15lb with portion control    Status  Partially Met      PT LONG TERM GOAL #2   Title  Pt will demo improved strategy with nose blowing to reduce strain on pelvic floor and reduce leakage with this task by at least 50%.    Baseline  no leakage with nose blowing    Status  Achieved      PT LONG TERM GOAL #3   Title  Pt will achieve at least 2/5 pelvic floor strength and understand how to use the Knack strategy to reduce  leakage with cough, sneeze, laughter, nose blowing, sit to stand.    Baseline  difficulty with recruitment of anterior PF, 1/5 at best with use of accessory muscles, best position is in quadruped with TrA co-contraction but Pt limited physically in maintaining this position    Status  Partially Met      PT LONG TERM GOAL #4   Title  Pt will achieve hip strength of at least 4+/5 bil to improve tasks such as transfers, stairs, and walking for exercise.    Baseline  5/5 throughout    Status  Achieved      PT LONG TERM GOAL #5   Title  Pt will report reduced use of adult diapers by at least 50% to improve her quality of life and confidence for dating and community activity.    Baseline  using diapers 100% of the time, now leaking with sit to stand and walking, ongoing leak with cough/sneeze, has regressed since last visit when she noticed less saturation with 100% use of diapers    Status  Not Met      PT LONG TERM GOAL #6   Title  Pt will fill out a 24 hour bladder and liquid intake diary for further understanding of leakage patterns.    Baseline  Pt never filled out log    Status  Not Met            Plan - 02/11/20 1819    Clinical Impression Statement  Pt has been participating in PT x 3 months and has attended 7 visits. She has met some goals including improved hip strength and development of HEP.  She has lost 13-15 pounds since starting PT with focus on diet and exercise.  She has been dealing with some health set backs which have interfered somewhat with her progress and ability to fully participate in PT.  She has ongoing dizziness/vertigo with positional changes which limited session today as PT tried to find a more optimal position for PF contractions to improve recruitment and awareness.  PF strength has only reached 1/5 and depends on significant overflow from accessory muscle use of gluteals and adductors.  Pt has very limited if any awareness of anterior pelvic floor muscles and  continues to have signif leakage  throughout day and night.  She wears and saturates diapers daily.  She noted onset of new leakage with sit to stand and walking since last visit.  PT has tried various methods including TCs, VCs, quick stretch, positional changes, and imagery to help Pt improve her PF recruitment.  Pt noted some improved awarenss in quadruped today but Pt was limited in this position by general weakness and onset of vertigo/dizziness threat.  PT gave Pt info on e-stim units for PF strength but Pt is limited financially at this time to invest in one.  Upon reviewing goals and lack of progress at this stage, PT and Pt discussed the need for Pt to return to referring MD for next steps.  Plan to discharge PT today and have Pt schedule appt with referring MD.    Comorbidities  obesity, does not exercise, worsening SUI since 2018    PT Frequency  Biweekly    PT Duration  8 weeks    PT Treatment/Interventions  ADLs/Self Care Home Management;Biofeedback;Electrical Stimulation;Neuromuscular re-education;Therapeutic exercise;Therapeutic activities;Functional mobility training;Patient/family education;Manual techniques;Passive range of motion;Spinal Manipulations    PT Next Visit Plan  d/c due to lack of progress.  PT recommends returning to referring MD    PT Home Exercise Plan  Access Code: [email or web address] + walking program    Consulted and Agree with Plan of Care  Patient       Patient will benefit from skilled therapeutic intervention in order to improve the following deficits and impairments:     Visit Diagnosis: Muscle weakness (generalized)  Unspecified lack of coordination  Abnormal posture     Problem List Patient Active Problem List   Diagnosis Date Noted  . Stress incontinence, female 10/27/2017  . Prolapsed internal hemorrhoids, grade 2 06/07/2017  . Latex allergy 06/07/2017  . PTSD (post-traumatic stress disorder) 08/22/2011  . Depression, major, recurrent ([place]) 08/22/2011   . Hypothyroid 08/22/2011    PHYSICAL THERAPY DISCHARGE SUMMARY  Visits from Start of Care: 7  Current functional level related to goals / functional outcomes: See above    Remaining deficits: See above   Education / Equipment: HEP, return to MD Plan: Patient agrees to discharge.  Patient goals were partially met. Patient is being discharged due to lack of progress.  ?????        Baruch Merl, PT 02/11/20 6:33 PM   [facility] [address], [place], [place], [address] Phone: [phone]   Fax:  [phone]  Name: Jancy Sprankle MRN: [id number] Date of Birth: 11-09-1985

## 2020-02-25 ENCOUNTER — Ambulatory Visit: Payer: Medicaid Other | Admitting: Physical Therapy

## 2020-03-09 ENCOUNTER — Encounter: Payer: Medicaid Other | Admitting: Physical Therapy

## 2020-03-23 ENCOUNTER — Encounter: Payer: Medicaid Other | Admitting: Physical Therapy

## 2020-03-23 ENCOUNTER — Encounter (HOSPITAL_BASED_OUTPATIENT_CLINIC_OR_DEPARTMENT_OTHER): Payer: Self-pay

## 2020-03-23 ENCOUNTER — Other Ambulatory Visit: Payer: Self-pay

## 2020-03-23 ENCOUNTER — Emergency Department (HOSPITAL_BASED_OUTPATIENT_CLINIC_OR_DEPARTMENT_OTHER)
Admission: EM | Admit: 2020-03-23 | Discharge: 2020-03-24 | Disposition: A | Discharge status: Home / Self Care | Payer: Medicaid Other | Attending: Emergency Medicine | Admitting: Emergency Medicine

## 2020-03-23 DIAGNOSIS — J069 Acute upper respiratory infection, unspecified: Secondary | ICD-10-CM | POA: Insufficient documentation

## 2020-03-23 DIAGNOSIS — R0781 Pleurodynia: Secondary | ICD-10-CM | POA: Insufficient documentation

## 2020-03-23 DIAGNOSIS — F419 Anxiety disorder, unspecified: Secondary | ICD-10-CM

## 2020-03-23 DIAGNOSIS — Z87891 Personal history of nicotine dependence: Secondary | ICD-10-CM | POA: Diagnosis not present

## 2020-03-23 DIAGNOSIS — Z20822 Contact with and (suspected) exposure to covid-19: Secondary | ICD-10-CM | POA: Insufficient documentation

## 2020-03-23 DIAGNOSIS — R05 Cough: Secondary | ICD-10-CM | POA: Diagnosis present

## 2020-03-23 LAB — CBC
HCT: 42.2 % (ref 36.0–46.0)
Hemoglobin: 14.1 g/dL (ref 12.0–15.0)
MCH: 29.5 pg (ref 26.0–34.0)
MCHC: 33.4 g/dL (ref 30.0–36.0)
MCV: 88.3 fL (ref 80.0–100.0)
Platelets: 279 10*3/uL (ref 150–400)
RBC: 4.78 MIL/uL (ref 3.87–5.11)
RDW: 12.5 % (ref 11.5–15.5)
WBC: 7.8 10*3/uL (ref 4.0–10.5)
nRBC: 0 % (ref 0.0–0.2)

## 2020-03-23 LAB — BASIC METABOLIC PANEL
Anion gap: 12 (ref 5–15)
BUN: 13 mg/dL (ref 6–20)
CO2: 20 mmol/L — ABNORMAL LOW (ref 22–32)
Calcium: 8.9 mg/dL (ref 8.9–10.3)
Chloride: 108 mmol/L (ref 98–111)
Creatinine, Ser: 0.74 mg/dL (ref 0.44–1.00)
GFR calc Af Amer: >60 mL/min (ref >60)
GFR calc non Af Amer: >60 mL/min (ref >60)
Glucose, Bld: 109 mg/dL — ABNORMAL HIGH (ref 70–99)
Potassium: 3.2 mmol/L — ABNORMAL LOW (ref 3.5–5.1)
Sodium: 140 mmol/L (ref 135–145)

## 2020-03-23 LAB — TROPONIN I (HIGH SENSITIVITY): Troponin I (High Sensitivity): 3 ng/L (ref <18)

## 2020-03-23 MED ORDER — SODIUM CHLORIDE 0.9% FLUSH
3.0000 mL | Freq: Once | INTRAVENOUS | Status: DC
  Filled 2020-03-23: qty 3

## 2020-03-23 NOTE — ED Triage Notes (Signed)
Pt c/o CP, SOB x 45 min-pt reports cough/fever last week-states she has hx of "tachycardia"-also states she has hx of panic attacks-took valium 20mg  at 830pm-presents to triage hyperventilating/crying

## 2020-03-24 ENCOUNTER — Emergency Department (HOSPITAL_BASED_OUTPATIENT_CLINIC_OR_DEPARTMENT_OTHER): Payer: Medicaid Other

## 2020-03-24 LAB — PREGNANCY, URINE: Preg Test, Ur: NEGATIVE

## 2020-03-24 MED ORDER — NAPROXEN 250 MG PO TABS
500.0000 mg | ORAL_TABLET | Freq: Once | ORAL | Status: AC
  Administered 2020-03-24: 500 mg via ORAL
  Filled 2020-03-24: qty 2

## 2020-03-24 MED ORDER — POTASSIUM CHLORIDE CRYS ER 20 MEQ PO TBCR
40.0000 meq | EXTENDED_RELEASE_TABLET | Freq: Once | ORAL | Status: AC
  Administered 2020-03-24: 40 meq via ORAL
  Filled 2020-03-24: qty 2

## 2020-03-24 NOTE — ED Provider Notes (Signed)
[facility] Provider Note: Georgena Spurling, MD, FACEP  CSN: [id number] MRN: [id number] ARRIVAL: 03/23/20 at 2142 ROOM: [place]  Chest Pain   HISTORY OF PRESENT ILLNESS  03/24/20 12:02 AM Michele Armstrong is a 34 y.o. female with history of anxiety.  She has had episodic vomiting since December of last year and a cough since March of this year which she attributes to an upper and lower endoscopy during which she coughed persistently.  She is here today because of URI symptoms for the past 5 days.  Specifically she has had a cough productive of clear sputum, sharp right lower anterior pleuritic chest pain (which she rates as a 7 out of 10, worse with deep breathing or coughing).  She is also noted anterior cervical lymphadenopathy.  She admits to what felt like a panic attack about 8:30 PM at which time she took 20 mg of Valium.  Triage notes she presented to the ED hyperventilating and crying.  Those symptoms have improved.  She also has a history of headaches and states she has a headache presently.  The only thing that helps her vomiting is rectal Phenergan but avoid using it due to side effects.  She has a history of tachycardia and was noted to be tachycardic on arrival and had tachycardia that waxed and waned during my time in the room.   Past Medical History:  Diagnosis Date  . Anxiety   . Arrhythmia    tachycardia  . Depression   . Fatigue   . Headache(784.0)   . Hypothyroid   . Internal hemorrhoids   . PTSD (post-traumatic stress disorder)   . Seizures ([place])    Medication related seizure x 1    Past Surgical History:  Procedure Laterality Date  . CHOLECYSTECTOMY    . FLEXIBLE SIGMOIDOSCOPY N/A 06/01/2017   Procedure: FLEXIBLE SIGMOIDOSCOPY;  Surgeon: Milus Banister, MD;  Location: [facility];  Service: Endoscopy;  Laterality: N/A;  . HEMORRHOID BANDING  2018  . WISDOM TOOTH EXTRACTION      Family History  Problem Relation Age of Onset    . Alcohol abuse Father   . Drug abuse Father   . Hypertension Father   . Hypertension Mother   . Diabetes Mother   . Leukemia Maternal Grandmother   . Breast cancer Maternal Aunt   . Colon cancer Neg Hx   . Stomach cancer Neg Hx     Social History   Tobacco Use  . Smoking status: Former Smoker    Types: Cigarettes  . Smokeless tobacco: Never Used  Vaping Use  . Vaping Use: Former  Substance Use Topics  . Alcohol use: Not Currently  . Drug use: Not Currently    Types: Marijuana, Other-see comments    Prior to Admission medications   Medication Sig Start Date End Date Taking? Authorizing Provider  acetaminophen (TYLENOL) 325 MG tablet Take 650 mg by mouth every 6 (six) hours as needed.    [provider]  busPIRone (BUSPAR) 10 MG tablet Take 10 mg by mouth 3 (three) times daily.    [provider]  calcium carbonate (TUMS - DOSED IN MG ELEMENTAL CALCIUM) 500 MG chewable tablet Chew 1-2 tablets by mouth as needed for indigestion or heartburn.    [provider]  hydrOXYzine (ATARAX/VISTARIL) 10 MG tablet Take by mouth. 07/18/19   [provider]  levonorgestrel (MIRENA) 20 MCG/24HR IUD 1 each by Intrauterine route once.    [provider]  medroxyPROGESTERone (PROVERA) 10 MG tablet Take 2 tablets (20 mg total) by mouth daily. Use for ten days 10/31/19   Truett Mainland, DO  omeprazole (PRILOSEC) 10 MG capsule Take 10 mg by mouth daily.    [provider]  Prenat-FeAsp-Meth-FA-DHA w/o A (PRENATE PIXIE) 10-0.6-0.4-200 MG CAPS Take 1 capsule by mouth daily.    [provider]  traZODone (DESYREL) 50 MG tablet Take by mouth. 07/18/19   [provider]    Allergies Morphine and related and Latex   REVIEW OF SYSTEMS  Negative except as noted here or in the History of Present Illness.   PHYSICAL EXAMINATION  Initial Vital Signs Blood pressure (!) 107/92, pulse (!) 107, temperature 97.9 F (36.6 C),  temperature source Oral, resp. rate 19, height 5\' 4"  (1.626 m), SpO2 92 %, currently breastfeeding.  Examination General: Well-developed, well-nourished female in no acute distress; appearance consistent with age of record HENT: normocephalic; atraumatic Eyes: pupils equal, round and reactive to light; extraocular muscles intact Neck: supple Heart: regular rate and rhythm; remittent sinus tachycardia Lungs: clear to auscultation bilaterally; mild hyperventilation Abdomen: soft; nondistended; nontender; bowel sounds present Extremities: No deformity; full range of motion; pulses normal Neurologic: Awake, alert and oriented; motor function intact in all extremities and symmetric; no facial droop Skin: Warm and dry Psychiatric: Anxious; circumstantial speech   RESULTS  Summary of this visit's results, reviewed and interpreted by myself:  EKG Interpretation:  Date & Time: 03/24/2020 12:13 AM  Rate: 97  Rhythm: normal sinus rhythm  QRS Axis: normal  Intervals: QT prolonged  ST/T Wave abnormalities: nonspecific T wave changes  Conduction Disutrbances:none  Narrative Interpretation: Tremor artifact  Old EKG Reviewed: unchanged   Laboratory Studies: Results for orders placed or performed during the hospital encounter of 03/23/20 (from the past 24 hour(s))  Basic metabolic panel     Status: Abnormal   Collection Time: 03/23/20 10:40 PM  Result Value Ref Range   Sodium 140 135 - 145 mmol/L   Potassium 3.2 (L) 3.5 - 5.1 mmol/L   Chloride 108 98 - 111 mmol/L   CO2 20 (L) 22 - 32 mmol/L   Glucose, Bld 109 (H) 70 - 99 mg/dL   BUN 13 6 - 20 mg/dL   Creatinine, Ser 0.74 0.44 - 1.00 mg/dL   Calcium 8.9 8.9 - 10.3 mg/dL   GFR calc non Af Amer >60 >60 mL/min   GFR calc Af Amer >60 >60 mL/min   Anion gap 12 5 - 15  CBC     Status: None   Collection Time: 03/23/20 10:40 PM  Result Value Ref Range   WBC 7.8 4.0 - 10.5 K/uL   RBC 4.78 3.87 - 5.11 MIL/uL   Hemoglobin 14.1 12.0 - 15.0 g/dL    HCT 42.2 36 - 46 %   MCV 88.3 80.0 - 100.0 fL   MCH 29.5 26.0 - 34.0 pg   MCHC 33.4 30.0 - 36.0 g/dL   RDW 12.5 11.5 - 15.5 %   Platelets 279 150 - 400 K/uL   nRBC 0.0 0.0 - 0.2 %  Troponin I (High Sensitivity)     Status: None   Collection Time: 03/23/20 10:40 PM  Result Value Ref Range   Troponin I (High Sensitivity) 3 <18 ng/L  Pregnancy, urine     Status: None   Collection Time: 03/24/20 12:30 AM  Result Value Ref Range   Preg Test, Ur NEGATIVE NEGATIVE   Imaging Studies: DG Chest  2 View  Result Date: 03/24/2020 CLINICAL DATA:  Cough and shortness of breath EXAM: CHEST - 2 VIEW COMPARISON:  03/22/2019 FINDINGS: The heart size and mediastinal contours are within normal limits. Both lungs are clear. The visualized skeletal structures are unremarkable. IMPRESSION: No active cardiopulmonary disease. Electronically Signed   By: Ulyses Jarred M.D.   On: 03/24/2020 01:13    ED COURSE and MDM  Nursing notes, initial and subsequent vitals signs, including pulse oximetry, reviewed and interpreted by myself.  Vitals:   03/23/20 2156 03/23/20 2234 03/24/20 0041 03/24/20 0059  BP: 112/76 (!) 107/92 116/80   Pulse: (!) 116 (!) 107 85   Resp: (!) 28 19 20    Temp: 97.9 F (36.6 C)   98.8 F (37.1 C)  TempSrc: Oral   Oral  SpO2: 100% 92% 100%   Height:       Medications  sodium chloride flush (NS) 0.9 % injection 3 mL (3 mLs Intravenous Not Given 03/23/20 2244)  potassium chloride SA (KLOR-CON) CR tablet 40 mEq (has no administration in time range)  naproxen (NAPROSYN) tablet 500 mg (500 mg Oral Given 03/24/20 0024)   History and examination are consistent with a viral illness with cough.  Pain is pleuritic in nature and I have low index of suspicion for PE.  Patient is requesting a Covid test.  Tachycardia is likely associated with anxiety as the patient has clearly been anxious as noticed by staff and as reported by the patient herself.   PROCEDURES  Procedures   ED DIAGNOSES       ICD-10-CM   1. Viral upper respiratory tract infection with cough  J06.9   2. Anxiety  F41.9   3. Pleuritic chest pain  R07.81        Shanon Rosser, MD 03/24/20 0131

## 2020-03-25 LAB — SARS CORONAVIRUS 2 (TAT 6-24 HRS): SARS Coronavirus 2: NEGATIVE

## 2020-03-26 DIAGNOSIS — R7989 Other specified abnormal findings of blood chemistry: Secondary | ICD-10-CM | POA: Insufficient documentation

## 2020-04-02 ENCOUNTER — Other Ambulatory Visit: Payer: Self-pay

## 2020-04-02 ENCOUNTER — Ambulatory Visit (HOSPITAL_BASED_OUTPATIENT_CLINIC_OR_DEPARTMENT_OTHER): Payer: Medicaid Other | Admitting: Family Medicine

## 2020-04-02 ENCOUNTER — Encounter: Payer: Self-pay | Admitting: Family Medicine

## 2020-04-02 ENCOUNTER — Other Ambulatory Visit (HOSPITAL_COMMUNITY)
Admission: RE | Admit: 2020-04-02 | Discharge: 2020-04-02 | Disposition: A | Discharge status: Home / Self Care | Payer: Medicaid Other | Source: Ambulatory Visit | Attending: Family Medicine | Admitting: Family Medicine

## 2020-04-02 VITALS — BP 106/80 | HR 100 | Ht 64.0 in | Wt 230.0 lb

## 2020-04-02 DIAGNOSIS — N393 Stress incontinence (female) (male): Secondary | ICD-10-CM

## 2020-04-02 DIAGNOSIS — Z975 Presence of (intrauterine) contraceptive device: Secondary | ICD-10-CM

## 2020-04-02 DIAGNOSIS — Z30431 Encounter for routine checking of intrauterine contraceptive device: Secondary | ICD-10-CM

## 2020-04-02 DIAGNOSIS — Z01419 Encounter for gynecological examination (general) (routine) without abnormal findings: Secondary | ICD-10-CM

## 2020-04-02 NOTE — Progress Notes (Signed)
GYNECOLOGY ANNUAL PREVENTATIVE CARE ENCOUNTER NOTE  Subjective:   Michele Armstrong is a 34 y.o. G21P1011 female here for a routine annual gynecologic exam.  Current complaints: Continues to have incontinence, which appears to be a mixture of stress and overflow incontinence. She has completed PT with minimal imporvement. Denies abnormal vaginal bleeding, discharge, pelvic pain, problems with intercourse or other gynecologic concerns.    Gynecologic History No LMP recorded. (Menstrual status: IUD). Patient is sexually active  Contraception: IUD Last Pap: 2018. Results were: normal Last mammogram: n/a.  Obstetric History OB History  Gravida Para Term Preterm AB Living  2 1 1   1 1   SAB TAB Ectopic Multiple Live Births        0 1    # Outcome Date GA Lbr Len/2nd Weight Sex Delivery Anes PTL Lv  2 Term 11/24/16 [redacted]w[redacted]d / 00:35 7 lb 2.5 oz (3.246 kg) M Vag-Spont EPI  LIV  1 AB             Past Medical History:  Diagnosis Date  . Anxiety   . Arrhythmia    tachycardia  . Depression   . Fatigue   . Headache(784.0)   . Hypothyroid   . Internal hemorrhoids   . PTSD (post-traumatic stress disorder)   . Seizures ([place])    Medication related seizure x 1    Past Surgical History:  Procedure Laterality Date  . CHOLECYSTECTOMY    . FLEXIBLE SIGMOIDOSCOPY N/A 06/01/2017   Procedure: FLEXIBLE SIGMOIDOSCOPY;  Surgeon: Milus Banister, MD;  Location: [facility];  Service: Endoscopy;  Laterality: N/A;  . HEMORRHOID BANDING  2018  . WISDOM TOOTH EXTRACTION      Current Outpatient Medications on File Prior to Visit  Medication Sig Dispense Refill  . clonazePAM (KLONOPIN) 0.5 MG tablet Take 0.5 mg by mouth 2 (two) times daily as needed for anxiety.    Marland Kitchen dextromethorphan-guaiFENesin (MUCINEX DM) 30-600 MG 12hr tablet Take 1 tablet by mouth 2 (two) times daily.    Marland Kitchen levonorgestrel (MIRENA) 20 MCG/24HR IUD 1 each by Intrauterine route once.    . meloxicam (MOBIC) 7.5 MG tablet Take 7.5 mg  by mouth daily.    Marland Kitchen acetaminophen (TYLENOL) 325 MG tablet Take 650 mg by mouth every 6 (six) hours as needed.    . busPIRone (BUSPAR) 10 MG tablet Take 10 mg by mouth 3 (three) times daily. (Patient not taking: Reported on 04/02/2020)    . calcium carbonate (TUMS - DOSED IN MG ELEMENTAL CALCIUM) 500 MG chewable tablet Chew 1-2 tablets by mouth as needed for indigestion or heartburn. (Patient not taking: Reported on 04/02/2020)    . hydrOXYzine (ATARAX/VISTARIL) 10 MG tablet Take by mouth. (Patient not taking: Reported on 04/02/2020)    . medroxyPROGESTERone (PROVERA) 10 MG tablet Take 2 tablets (20 mg total) by mouth daily. Use for ten days (Patient not taking: Reported on 04/02/2020) 60 tablet 2  . omeprazole (PRILOSEC) 10 MG capsule Take 10 mg by mouth daily. (Patient not taking: Reported on 04/02/2020)    . Prenat-FeAsp-Meth-FA-DHA w/o A (PRENATE PIXIE) 10-0.6-0.4-200 MG CAPS Take 1 capsule by mouth daily. (Patient not taking: Reported on 04/02/2020)    . traZODone (DESYREL) 50 MG tablet Take by mouth. (Patient not taking: Reported on 04/02/2020)     No current facility-administered medications on file prior to visit.    Allergies  Allergen Reactions  . Morphine And Related Nausea And Vomiting  . Latex Rash    Causes  UTI when condom used.    Social History   Socioeconomic History  . Marital status: Married    Spouse name: Juliane Lack  . Number of children: 1  . Years of education: Not on file  . Highest education level: Not on file  Occupational History  . Occupation: homemaker  Tobacco Use  . Smoking status: Former Smoker    Types: Cigarettes  . Smokeless tobacco: Never Used  Vaping Use  . Vaping Use: Former  Substance and Sexual Activity  . Alcohol use: Not Currently  . Drug use: Not Currently    Types: Marijuana, Other-see comments  . Sexual activity: Not on file  Other Topics Concern  . Not on file  Social History Narrative   Married, homemaker, son Malachi born 2018   Social  Determinants of Health   Financial Resource Strain:   . Difficulty of Paying Living Expenses:   Food Insecurity:   . Worried About Charity fundraiser in the Last Year:   . Arboriculturist in the Last Year:   Transportation Needs:   . Film/video editor (Medical):   Marland Kitchen Lack of Transportation (Non-Medical):   Physical Activity:   . Days of Exercise per Week:   . Minutes of Exercise per Session:   Stress:   . Feeling of Stress :   Social Connections:   . Frequency of Communication with Friends and Family:   . Frequency of Social Gatherings with Friends and Family:   . Attends Religious Services:   . Active Member of Clubs or Organizations:   . Attends Archivist Meetings:   Marland Kitchen Marital Status:   Intimate Partner Violence:   . Fear of Current or Ex-Partner:   . Emotionally Abused:   Marland Kitchen Physically Abused:   . Sexually Abused:     Family History  Problem Relation Age of Onset  . Alcohol abuse Father   . Drug abuse Father   . Hypertension Father   . Hypertension Mother   . Diabetes Mother   . Leukemia Maternal Grandmother   . Breast cancer Maternal Aunt   . Colon cancer Neg Hx   . Stomach cancer Neg Hx     The following portions of the patient's history were reviewed and updated as appropriate: allergies, current medications, past family history, past medical history, past social history, past surgical history and problem list.  Review of Systems Pertinent items are noted in HPI.   Objective:  BP 106/80   Pulse 100   Ht 5\' 4"  (1.626 m)   Wt 230 lb (104.3 kg)   BMI 39.48 kg/m  Wt Readings from Last 3 Encounters:  04/02/20 230 lb (104.3 kg)  10/25/19 270 lb (122.5 kg)  03/22/19 230 lb (104.3 kg)     Chaperone present during exam  CONSTITUTIONAL: Well-developed, well-nourished female in no acute distress.  HENT:  Normocephalic, atraumatic, External right and left ear normal. Oropharynx is clear and moist EYES: Conjunctivae and EOM are normal. Pupils  are equal, round, and reactive to light. No scleral icterus.  NECK: Normal range of motion, supple, no masses.  Normal thyroid.   CARDIOVASCULAR: Normal heart rate noted, regular rhythm RESPIRATORY: Clear to auscultation bilaterally. Effort and breath sounds normal, no problems with respiration noted. BREASTS: Symmetric in size. No masses, skin changes, nipple drainage, or lymphadenopathy. ABDOMEN: Soft, normal bowel sounds, no distention noted.  No tenderness, rebound or guarding.  PELVIC: Normal appearing external genitalia; normal appearing vaginal mucosa and cervix.  No abnormal discharge noted.  Normal uterine size, no other palpable masses, no uterine or adnexal tenderness. Strings in place MUSCULOSKELETAL: Normal range of motion. No tenderness.  No cyanosis, clubbing, or edema.  2+ distal pulses. SKIN: Skin is warm and dry. No rash noted. Not diaphoretic. No erythema. No pallor. NEUROLOGIC: Alert and oriented to person, place, and time. Normal reflexes, muscle tone coordination. No cranial nerve deficit noted. PSYCHIATRIC: Normal mood and affect. Normal behavior. Normal judgment and thought content.  Assessment:  Annual gynecologic examination with pap smear   Plan:  1. Well Woman Exam Will follow up results of pap smear and manage accordingly. - Cytology - PAP( [place])  2. IUD check up Strings visualized  3. Stress incontinence, female Will refer to urogyn. - Ambulatory referral to Urogynecology   Routine preventative health maintenance measures emphasized. Please refer to After Visit Summary for other counseling recommendations.    Loma Boston, [place] for [employer]

## 2020-04-03 LAB — CYTOLOGY - PAP
Comment: NEGATIVE
Diagnosis: NEGATIVE
High risk HPV: NEGATIVE

## 2020-04-21 DIAGNOSIS — F319 Bipolar disorder, unspecified: Secondary | ICD-10-CM | POA: Insufficient documentation

## 2020-07-02 ENCOUNTER — Ambulatory Visit (INDEPENDENT_AMBULATORY_CARE_PROVIDER_SITE_OTHER): Payer: Medicaid Other | Admitting: Family Medicine

## 2020-07-02 ENCOUNTER — Encounter: Payer: Self-pay | Admitting: Family Medicine

## 2020-07-02 ENCOUNTER — Other Ambulatory Visit: Payer: Self-pay

## 2020-07-02 VITALS — BP 111/79 | HR 98 | Ht 64.0 in | Wt 216.0 lb

## 2020-07-02 DIAGNOSIS — Z30432 Encounter for removal of intrauterine contraceptive device: Secondary | ICD-10-CM | POA: Diagnosis not present

## 2020-07-02 NOTE — Patient Instructions (Signed)
[facility] OB/Gyn Offices:  Michele Armstrong: Dr Rico Junker Dr Ronnald Ramp Dr Charlsie Merles Dr [facility]: Dr Donne Hazel Dr Broadus John

## 2020-07-02 NOTE — Progress Notes (Signed)
IUD Removal  Patient was in the dorsal lithotomy position, normal external genitalia was noted.  A speculum was placed in the patient's vagina, normal discharge was noted, no lesions. The multiparous cervix was visualized, no lesions, no abnormal discharge,  and was swabbed with Betadine using scopettes.  The strings of the IUD was grasped and pulled using ring forceps.  The IUD was successfully removed in its entirety.  Patient tolerated the procedure well.

## 2020-10-03 NOTE — L&D Delivery Note (Addendum)
OB/GYN Faculty Practice Delivery Note  Michele Armstrong is a 35 y.o. G3P1011 s/p NSVD at [redacted]w[redacted]d She was admitted for IOL GDMA.   ROM: 2h 076mith clear fluid GBS Status: negative Maximum Maternal Temperature: 98.2  Labor Progress: Presented for IOL, received cytotec x4 and cooks balloon, then progressed and transitioned to pitocin, and progressed to fully and delivered as listed below  Delivery Date/Time: 1900 on 04/30/2021 Delivery: Called to room and patient was complete and pushing. Head delivered. Nuchal cord was present and released. Shoulder and body delivered in usual fashion. Infant with spontaneous cry, placed on mother's abdomen, dried and stimulated. Cord clamped x 2 after 1-minute delay, and cut by patient. Cord blood drawn. Placenta delivered spontaneously with gentle cord traction. Fundus firm with massage and Pitocin.   At this time, Liletta PP IUD placement procedure was started.   IUD was grasped between sterile gloved fingers. Sterile lubrication applied to sterile gloved hand for ease of insertion. Fundus identified through abdominal wall using non-insertion hand. IUD inserted to fundus with bimanual technique. IUD carefully released at the fundus and insertion hand gently removed from vagina.   Strings trimmed to the level of the introitus. Patient tolerated procedure well.  Labia, perineum, vagina, and cervix inspected inspected.   Placenta: intact, spontaneous, went to L&D Complications: None Lacerations: None EBL: 50 Analgesia: Epidural   Infant: female  APGARs 9,9  weight pending  AnRenard MatterMD, MPH OB Fellow, Faculty Practice   Attestation of Supervision of Fellow: I confirm that I have verified the information documented in the  FeRuddnote and that I have also personally  supervised  the history, physical exam and all medical decision making activities.  I have verified that all services and findings are accurately documented in this note; and I agree  with management and plan as outlined in the documentation. I have also made any necessary editorial changes. I was gloved and present for the entirety of the delivery.   JaGabriel CarinaCNLanettor WoDean Foods CompanyCoMojaveroup 05/01/2021 10:29 AM

## 2020-10-30 ENCOUNTER — Ambulatory Visit (INDEPENDENT_AMBULATORY_CARE_PROVIDER_SITE_OTHER): Payer: Medicaid Other | Admitting: Family Medicine

## 2020-10-30 ENCOUNTER — Other Ambulatory Visit (HOSPITAL_COMMUNITY)
Admission: RE | Admit: 2020-10-30 | Discharge: 2020-10-30 | Disposition: A | Discharge status: Home / Self Care | Payer: Medicaid Other | Source: Ambulatory Visit | Attending: Family Medicine | Admitting: Family Medicine

## 2020-10-30 ENCOUNTER — Other Ambulatory Visit: Payer: Self-pay

## 2020-10-30 ENCOUNTER — Encounter: Payer: Self-pay | Admitting: Family Medicine

## 2020-10-30 VITALS — BP 102/77 | HR 100 | Wt 226.0 lb

## 2020-10-30 DIAGNOSIS — O99341 Other mental disorders complicating pregnancy, first trimester: Secondary | ICD-10-CM | POA: Insufficient documentation

## 2020-10-30 DIAGNOSIS — E038 Other specified hypothyroidism: Secondary | ICD-10-CM | POA: Diagnosis not present

## 2020-10-30 DIAGNOSIS — E669 Obesity, unspecified: Secondary | ICD-10-CM | POA: Insufficient documentation

## 2020-10-30 DIAGNOSIS — Z3480 Encounter for supervision of other normal pregnancy, unspecified trimester: Secondary | ICD-10-CM

## 2020-10-30 DIAGNOSIS — O0993 Supervision of high risk pregnancy, unspecified, third trimester: Secondary | ICD-10-CM | POA: Insufficient documentation

## 2020-10-30 DIAGNOSIS — O99281 Endocrine, nutritional and metabolic diseases complicating pregnancy, first trimester: Secondary | ICD-10-CM | POA: Diagnosis not present

## 2020-10-30 DIAGNOSIS — F331 Major depressive disorder, recurrent, moderate: Secondary | ICD-10-CM | POA: Diagnosis not present

## 2020-10-30 DIAGNOSIS — Z3A11 11 weeks gestation of pregnancy: Secondary | ICD-10-CM | POA: Insufficient documentation

## 2020-10-30 DIAGNOSIS — O99210 Obesity complicating pregnancy, unspecified trimester: Secondary | ICD-10-CM | POA: Diagnosis not present

## 2020-10-30 NOTE — Progress Notes (Signed)
Subjective:  Michele Armstrong is a G18P1011 [redacted]w[redacted]d being seen today for her first obstetrical visit.  Her obstetrical history is significant for prior SVD and obesity. This was a planned pregnancy. FOB is involved and is patient's partner, but lives in [place]. Patient has depression and is currently on Cymbalta - well controlled. Is under care of psychiatrist. Patient does intend to breast feed. Pregnancy history fully reviewed.  Patient reports no complaints.  BP 102/77   Pulse 100   Wt 226 lb (102.5 kg)   LMP 08/13/2020   BMI 38.79 kg/m   HISTORY: OB History  Gravida Para Term Preterm AB Living  3 1 1   1 1   SAB IAB Ectopic Multiple Live Births        0 1    # Outcome Date GA Lbr Len/2nd Weight Sex Delivery Anes PTL Lv  3 Current           2 Term 11/24/16 [redacted]w[redacted]d / 00:35 7 lb 2.5 oz (3.246 kg) M Vag-Spont EPI  LIV  1 AB             Past Medical History:  Diagnosis Date  . Anxiety   . Arrhythmia    tachycardia  . Depression   . Fatigue   . Headache(784.0)   . Hypothyroid   . Internal hemorrhoids   . PTSD (post-traumatic stress disorder)   . Seizures ([place])    Medication related seizure x 1    Past Surgical History:  Procedure Laterality Date  . CHOLECYSTECTOMY    . FLEXIBLE SIGMOIDOSCOPY N/A 06/01/2017   Procedure: FLEXIBLE SIGMOIDOSCOPY;  Surgeon: Milus Banister, MD;  Location: [facility];  Service: Endoscopy;  Laterality: N/A;  . HEMORRHOID BANDING  2018  . WISDOM TOOTH EXTRACTION      Family History  Problem Relation Age of Onset  . Alcohol abuse Father   . Drug abuse Father   . Hypertension Father   . Hypertension Mother   . Diabetes Mother   . Leukemia Maternal Grandmother   . Breast cancer Maternal Aunt   . Colon cancer Neg Hx   . Stomach cancer Neg Hx      Exam  BP 102/77   Pulse 100   Wt 226 lb (102.5 kg)   LMP 08/13/2020   BMI 38.79 kg/m   Chaperone present during exam  CONSTITUTIONAL: Well-developed, well-nourished female in no  acute distress.  HENT:  Normocephalic, atraumatic, External right and left ear normal. Oropharynx is clear and moist EYES: Conjunctivae and EOM are normal. Pupils are equal, round, and reactive to light. No scleral icterus.  NECK: Normal range of motion, supple, no masses.  Normal thyroid.  CARDIOVASCULAR: Normal heart rate noted, regular rhythm RESPIRATORY: Clear to auscultation bilaterally. Effort and breath sounds normal, no problems with respiration noted. BREASTS: Symmetric in size. No masses, skin changes, nipple drainage, or lymphadenopathy. ABDOMEN: Soft, normal bowel sounds, no distention noted.  No tenderness, rebound or guarding.  PELVIC: Normal appearing external genitalia; normal appearing vaginal mucosa and cervix. No abnormal discharge noted. Normal uterine size, no other palpable masses, no uterine or adnexal tenderness. MUSCULOSKELETAL: Normal range of motion. No tenderness.  No cyanosis, clubbing, or edema.  2+ distal pulses. SKIN: Skin is warm and dry. No rash noted. Not diaphoretic. No erythema. No pallor. NEUROLOGIC: Alert and oriented to person, place, and time. Normal reflexes, muscle tone coordination. No cranial nerve deficit noted. PSYCHIATRIC: Normal mood and affect. Normal behavior. Normal judgment and thought content.  Assessment:    Pregnancy: G3P1011 Patient Active Problem List   Diagnosis Date Noted  . Supervision of other normal pregnancy, antepartum 10/30/2020  . Obesity affecting pregnancy, antepartum 10/30/2020  . Stress incontinence, female 10/27/2017  . Prolapsed internal hemorrhoids, grade 2 06/07/2017  . Latex allergy 06/07/2017  . PTSD (post-traumatic stress disorder) 08/22/2011  . Depression, major, recurrent ([place]) 08/22/2011  . Hypothyroid 08/22/2011      Plan:   1. [redacted] weeks gestation of pregnancy - CBC/D/Plt+RPR+Rh+ABO+Rub Ab... - Enroll Patient in Babyscripts - Urine Culture - GC/Chlamydia probe amp ([place])not at [facility] - HgB  A1c - [place] MFM OB DETAIL +14 WK; Future - TSH  2. Supervision of other normal pregnancy, antepartum FHT and FH normal. [place] for FHT today. Desires panorama  - CBC/D/Plt+RPR+Rh+ABO+Rub Ab... - Enroll Patient in Babyscripts - Urine Culture - GC/Chlamydia probe amp ([place])not at [facility] - HgB A1c - [place] MFM OB DETAIL +14 WK; Future - TSH  3. Obesity affecting pregnancy, antepartum - CBC/D/Plt+RPR+Rh+ABO+Rub Ab... - Enroll Patient in Babyscripts - Urine Culture - GC/Chlamydia probe amp ([place])not at [facility] - HgB A1c - TSH  4. Moderate episode of recurrent major depressive disorder (HCC) On duloxetine - HgB A1c  5. Other specified hypothyroidism Check TSH - TSH     Problem list reviewed and updated. 75% of 30 min visit spent on counseling and coordination of care.     Truett Mainland 10/30/2020

## 2020-10-30 NOTE — Progress Notes (Signed)
gPatient states dating ultrasound done in [place], [place] - and lined up with LMP as far as EDC of 05-20-21. Bedside ultrasound sound done to obtain fetal heart rate due to maternal habitus .Kathrene Alu RN

## 2020-10-31 LAB — CBC/D/PLT+RPR+RH+ABO+RUB AB...
Antibody Screen: NEGATIVE
Basophils Absolute: 0 10*3/uL (ref 0.0–0.2)
Basos: 0 %
EOS (ABSOLUTE): 0.1 10*3/uL (ref 0.0–0.4)
Eos: 1 %
HCV Ab: <0.1 s/co ratio (ref 0.0–0.9)
HIV Screen 4th Generation wRfx: NONREACTIVE
Hematocrit: 39.1 % (ref 34.0–46.6)
Hemoglobin: 13.3 g/dL (ref 11.1–15.9)
Hepatitis B Surface Ag: NEGATIVE
Immature Grans (Abs): 0 10*3/uL (ref 0.0–0.1)
Immature Granulocytes: 0 %
Lymphocytes Absolute: 1.7 10*3/uL (ref 0.7–3.1)
Lymphs: 19 %
MCH: 30.1 pg (ref 26.6–33.0)
MCHC: 34 g/dL (ref 31.5–35.7)
MCV: 89 fL (ref 79–97)
Monocytes Absolute: 0.3 10*3/uL (ref 0.1–0.9)
Monocytes: 3 %
Neutrophils Absolute: 6.8 10*3/uL (ref 1.4–7.0)
Neutrophils: 77 %
Platelets: 309 10*3/uL (ref 150–450)
RBC: 4.42 x10E6/uL (ref 3.77–5.28)
RDW: 12.2 % (ref 11.7–15.4)
RPR Ser Ql: NONREACTIVE
Rh Factor: POSITIVE
Rubella Antibodies, IGG: 1.43 index (ref >0.99)
WBC: 8.9 10*3/uL (ref 3.4–10.8)

## 2020-10-31 LAB — HCV INTERPRETATION

## 2020-10-31 LAB — HEMOGLOBIN A1C
Est. average glucose Bld gHb Est-mCnc: 114 mg/dL
Hgb A1c MFr Bld: 5.6 % (ref 4.8–5.6)

## 2020-10-31 LAB — TSH: TSH: 5.47 u[IU]/mL — ABNORMAL HIGH (ref 0.450–4.500)

## 2020-11-02 LAB — GC/CHLAMYDIA PROBE AMP (~~LOC~~) NOT AT ARMC
Chlamydia: NEGATIVE
Comment: NEGATIVE
Comment: NORMAL
Neisseria Gonorrhea: NEGATIVE

## 2020-11-03 ENCOUNTER — Telehealth: Payer: Self-pay

## 2020-11-03 LAB — URINE CULTURE

## 2020-11-03 NOTE — Telephone Encounter (Signed)
Please add on free T3 and T4.

## 2020-11-03 NOTE — Telephone Encounter (Signed)
Called [facility] and added Free t3 and free T4. Kathrene Alu RN

## 2020-11-03 NOTE — Telephone Encounter (Signed)
Patient to inquire about her TSH levels being elevated. Will route to provider for plan of care. Kathrene Alu RN

## 2020-11-04 LAB — SPECIMEN STATUS REPORT

## 2020-11-04 LAB — T3, FREE: T3, Free: 3.6 pg/mL (ref 2.0–4.4)

## 2020-11-04 LAB — T4, FREE: Free T4: 0.91 ng/dL (ref 0.82–1.77)

## 2020-11-04 MED ORDER — LEVOTHYROXINE SODIUM 50 MCG PO TABS
50.0000 ug | ORAL_TABLET | Freq: Every day | ORAL | 3 refills | Status: DC
Start: 2020-11-04 — End: 2020-12-01

## 2020-11-04 NOTE — Addendum Note (Signed)
Addended by: Truett Mainland on: 11/04/2020 05:04 PM   Modules accepted: Orders

## 2020-11-19 ENCOUNTER — Ambulatory Visit (INDEPENDENT_AMBULATORY_CARE_PROVIDER_SITE_OTHER): Payer: Medicaid Other | Admitting: Family Medicine

## 2020-11-19 ENCOUNTER — Other Ambulatory Visit: Payer: Self-pay

## 2020-11-19 VITALS — BP 110/75 | HR 101 | Wt 227.0 lb

## 2020-11-19 DIAGNOSIS — N6452 Nipple discharge: Secondary | ICD-10-CM

## 2020-11-19 DIAGNOSIS — Z3480 Encounter for supervision of other normal pregnancy, unspecified trimester: Secondary | ICD-10-CM

## 2020-11-19 DIAGNOSIS — Z3A14 14 weeks gestation of pregnancy: Secondary | ICD-10-CM

## 2020-11-19 NOTE — Progress Notes (Signed)
   PRENATAL VISIT NOTE  Subjective:  Michele Armstrong is a 35 y.o. G3P1011 at [redacted]w[redacted]d being seen today for ongoing prenatal care.  She is currently monitored for the following issues for this low-risk pregnancy and has PTSD (post-traumatic stress disorder); Depression, major, recurrent ([place]); Hypothyroid; Prolapsed internal hemorrhoids, grade 2; Latex allergy; Stress incontinence, female; Supervision of other normal pregnancy, antepartum; and Obesity affecting pregnancy, antepartum on their problem list.  Patient reports problems with left nipple - over past couple of weeks, has had whitish discharge and pain in left nipple. Pain worse in the cold - occasionally turns purple if out in the cold. No swelling or pain in the breast..  Contractions: Not present. Vag. Bleeding: None.  Movement: Present. Denies leaking of fluid.   The following portions of the patient's history were reviewed and updated as appropriate: allergies, current medications, past family history, past medical history, past social history, past surgical history and problem list.   Objective:   Vitals:   11/19/20 0918  BP: 110/75  Pulse: (!) 101  Weight: 227 lb (103 kg)    Fetal Status:     Movement: Present     General:  Alert, oriented and cooperative. Patient is in no acute distress.  Skin: Skin is warm and dry. No rash noted.   Cardiovascular: Normal heart rate noted  Breast: No skin deformity to the left breast or nipple. No masses palpated. Small amount of whitish fluid expressed from nipple. No masses palpated under nipple.  Respiratory: Normal respiratory effort, no problems with respiration noted  Abdomen: Soft, gravid, appropriate for gestational age.  Pain/Pressure: Present     Pelvic: Cervical exam deferred        Extremities: Normal range of motion.  Edema: None  Mental Status: Normal mood and affect. Normal behavior. Normal judgment and thought content.   Assessment and Plan:  Pregnancy: G3P1011 at [redacted]w[redacted]d 1.  [redacted] weeks gestation of pregnancy  2. Supervision of other normal pregnancy, antepartum FHT normal  3. Nipple discharge in female In absence of nipple deformity, I feel that this is probably milk production. Culture obtained. Advised patient to not touch nipple - will reevaluate next week. Patient to call if worsens. If does appear infected, will start antibiotics and may need [place]  Preterm labor symptoms and general obstetric precautions including but not limited to vaginal bleeding, contractions, leaking of fluid and fetal movement were reviewed in detail with the patient. Please refer to After Visit Summary for other counseling recommendations.   No follow-ups on file.  Future Appointments  Date Time Provider [place]  11/26/2020  1:45 PM Truett Mainland, DO [facility] None  12/21/2020  2:00 PM [facility] NURSE [facility]  12/21/2020  2:15 PM [facility] US2 [facility] [place], DO

## 2020-11-19 NOTE — Progress Notes (Signed)
ROB patient presents for problem visit today.  CC: Infected Nipple in Left Breast.   patient notes pain. puss onset x 1 month ago pt would squeeze area to get puss out and now still has not had any relief.  Left breast is warm to touch and tender pt has not noticed any fever, has no piercings and has never had this to occur before.

## 2020-11-22 LAB — WOUND CULTURE

## 2020-11-23 MED ORDER — SULFAMETHOXAZOLE-TRIMETHOPRIM 800-160 MG PO TABS: 1.0000 | ORAL_TABLET | Freq: Two times a day (BID) | ORAL | 1 refills | Status: DC

## 2020-11-23 NOTE — Addendum Note (Signed)
Addended by: Truett Mainland on: 11/23/2020 10:42 AM   Modules accepted: Orders

## 2020-11-26 ENCOUNTER — Other Ambulatory Visit: Payer: Self-pay

## 2020-11-26 ENCOUNTER — Ambulatory Visit (INDEPENDENT_AMBULATORY_CARE_PROVIDER_SITE_OTHER): Payer: Medicaid Other | Admitting: Family Medicine

## 2020-11-26 VITALS — BP 114/79 | HR 105 | Wt 235.0 lb

## 2020-11-26 DIAGNOSIS — N6452 Nipple discharge: Secondary | ICD-10-CM

## 2020-11-26 DIAGNOSIS — Z3480 Encounter for supervision of other normal pregnancy, unspecified trimester: Secondary | ICD-10-CM

## 2020-11-26 DIAGNOSIS — E038 Other specified hypothyroidism: Secondary | ICD-10-CM

## 2020-11-26 DIAGNOSIS — F331 Major depressive disorder, recurrent, moderate: Secondary | ICD-10-CM

## 2020-11-26 DIAGNOSIS — Z3A15 15 weeks gestation of pregnancy: Secondary | ICD-10-CM

## 2020-11-26 MED ORDER — OMEPRAZOLE 20 MG PO CPDR: 20.0000 mg | DELAYED_RELEASE_CAPSULE | Freq: Every day | ORAL | 1 refills | Status: DC

## 2020-11-26 NOTE — Progress Notes (Signed)
Pt c/o heartburn

## 2020-11-26 NOTE — Progress Notes (Signed)
   PRENATAL VISIT NOTE  Subjective:  Michele Armstrong is a 35 y.o. G3P1011 at [redacted]w[redacted]d being seen today for ongoing prenatal care.  She is currently monitored for the following issues for this low-risk pregnancy and has PTSD (post-traumatic stress disorder); Depression, major, recurrent ([place]); Hypothyroid; Prolapsed internal hemorrhoids, grade 2; Latex allergy; Stress incontinence, female; Supervision of other normal pregnancy, antepartum; and Obesity affecting pregnancy, antepartum on their problem list.  Patient reports heartburn. Nipple pain has resolved with atitbiotics.  Contractions: Not present. Vag. Bleeding: None.  Movement: Present. Denies leaking of fluid.   The following portions of the patient's history were reviewed and updated as appropriate: allergies, current medications, past family history, past medical history, past social history, past surgical history and problem list.   Objective:   Vitals:   11/26/20 1356  BP: 114/79  Pulse: (!) 105  Weight: 235 lb (106.6 kg)    Fetal Status: Fetal Heart Rate (bpm): 155   Movement: Present     General:  Alert, oriented and cooperative. Patient is in no acute distress.  Skin: Skin is warm and dry. No rash noted.   Cardiovascular: Normal heart rate noted  Respiratory: Normal respiratory effort, no problems with respiration noted  Abdomen: Soft, gravid, appropriate for gestational age.  Pain/Pressure: Absent     Pelvic: Cervical exam deferred        Extremities: Normal range of motion.  Edema: None  Mental Status: Normal mood and affect. Normal behavior. Normal judgment and thought content.   Assessment and Plan:  Pregnancy: G3P1011 at [redacted]w[redacted]d 1. Supervision of other normal pregnancy, antepartum FHT and FH normal - TSH  2. [redacted] weeks gestation of pregnancy  3. Moderate episode of recurrent major depressive disorder (HCC) On duloxetine  4. Other specified hypothyroidism Recheck TSH  5. Nipple discharge in female Improved.  Finish antibiotic course.   Preterm labor symptoms and general obstetric precautions including but not limited to vaginal bleeding, contractions, leaking of fluid and fetal movement were reviewed in detail with the patient. Please refer to After Visit Summary for other counseling recommendations.   No follow-ups on file.  Future Appointments  Date Time Provider [place]  12/21/2020  2:00 PM [facility] NURSE [facility]  12/21/2020  2:15 PM [facility] US2 [facility]  12/24/2020  9:45 AM Truett Mainland, DO [facility] None    Truett Mainland, DO

## 2020-11-26 NOTE — Addendum Note (Signed)
Addended by: Phill Myron on: 11/26/2020 02:50 PM   Modules accepted: Orders

## 2020-11-27 LAB — TSH: TSH: 3.15 u[IU]/mL (ref 0.450–4.500)

## 2020-11-30 ENCOUNTER — Telehealth: Payer: Self-pay

## 2020-11-30 ENCOUNTER — Telehealth (INDEPENDENT_AMBULATORY_CARE_PROVIDER_SITE_OTHER): Payer: Medicaid Other | Admitting: Family Medicine

## 2020-11-30 DIAGNOSIS — N6452 Nipple discharge: Secondary | ICD-10-CM

## 2020-11-30 NOTE — Telephone Encounter (Signed)
-----   Message from [place], [place] sent at 11/30/2020  3:40 PM EST ----- Regarding: Staph infection Pt called stating she has been taking Bactrim for 1 week and pus is still draining from her nipple.

## 2020-11-30 NOTE — Telephone Encounter (Signed)
Referred for breast US.

## 2020-11-30 NOTE — Telephone Encounter (Signed)
Pt states she has been taking Bactrim for 1 week for a staph infection and pus is still draining from her nipple. Will send a message to the provider.  Kallan Bischoff l Dempsey Knotek, CMA

## 2020-12-01 MED ORDER — LEVOTHYROXINE SODIUM 100 MCG PO TABS: 100.0000 ug | ORAL_TABLET | Freq: Every day | ORAL | 3 refills | Status: DC

## 2020-12-01 MED ORDER — LEVOTHYROXINE SODIUM 100 MCG PO TABS: 50.0000 ug | ORAL_TABLET | Freq: Every day | ORAL | 3 refills | Status: DC

## 2020-12-01 NOTE — Addendum Note (Signed)
Addended by: Truett Mainland on: 12/01/2020 11:31 AM   Modules accepted: Orders

## 2020-12-03 ENCOUNTER — Ambulatory Visit
Admission: RE | Admit: 2020-12-03 | Discharge: 2020-12-03 | Disposition: A | Discharge status: Home / Self Care | Payer: Medicaid Other | Source: Ambulatory Visit | Attending: Family Medicine | Admitting: Family Medicine

## 2020-12-03 ENCOUNTER — Other Ambulatory Visit: Payer: Self-pay

## 2020-12-03 ENCOUNTER — Other Ambulatory Visit: Payer: Self-pay | Admitting: Family Medicine

## 2020-12-03 DIAGNOSIS — N6452 Nipple discharge: Secondary | ICD-10-CM

## 2020-12-03 NOTE — Telephone Encounter (Signed)
[place] and moved appointment to today. Called patient and notified of change.

## 2020-12-09 ENCOUNTER — Other Ambulatory Visit: Payer: Medicaid Other

## 2020-12-16 ENCOUNTER — Encounter: Payer: Self-pay | Admitting: Certified Nurse Midwife

## 2020-12-16 ENCOUNTER — Other Ambulatory Visit: Payer: Self-pay

## 2020-12-16 ENCOUNTER — Ambulatory Visit (INDEPENDENT_AMBULATORY_CARE_PROVIDER_SITE_OTHER): Payer: Medicaid Other | Admitting: Certified Nurse Midwife

## 2020-12-16 VITALS — BP 118/75 | HR 93 | Wt 232.1 lb

## 2020-12-16 DIAGNOSIS — O219 Vomiting of pregnancy, unspecified: Secondary | ICD-10-CM

## 2020-12-16 DIAGNOSIS — Z3402 Encounter for supervision of normal first pregnancy, second trimester: Secondary | ICD-10-CM

## 2020-12-16 DIAGNOSIS — Z3A17 17 weeks gestation of pregnancy: Secondary | ICD-10-CM

## 2020-12-16 DIAGNOSIS — O926 Galactorrhea: Secondary | ICD-10-CM

## 2020-12-16 NOTE — Progress Notes (Signed)
Patient states that she feels slight pressure in lower abdomen and states that it does not happen often. Also feels discomfort in hips in mornings after she "wakes up". Patient also complains of constant nausea and vomiting that happens multiple times a week throughout the day . She is currently not taking anything for it. Last but not least she complains of aching nipples and rates pain about an 9. Yellow discharge from left nipple.

## 2020-12-19 MED ORDER — ONDANSETRON 8 MG PO TBDP: 8.0000 mg | ORAL_TABLET | Freq: Three times a day (TID) | ORAL | 0 refills | Status: DC | PRN

## 2020-12-19 MED ORDER — DOXYLAMINE-PYRIDOXINE 10-10 MG PO TBEC: 1.0000 | DELAYED_RELEASE_TABLET | Freq: Every day | ORAL | 2 refills | Status: DC

## 2020-12-19 NOTE — Progress Notes (Signed)
   PRENATAL VISIT NOTE  Subjective:  Michele Armstrong is a 35 y.o. G3P1011 at [redacted]w[redacted]d being seen today for ongoing prenatal care.  She is currently monitored for the following issues for this low-risk pregnancy and has PTSD (post-traumatic stress disorder); Depression, major, recurrent ([place]); Hypothyroid; Prolapsed internal hemorrhoids, grade 2; Latex allergy; Stress incontinence, female; Supervision of other normal pregnancy, antepartum; and Obesity affecting pregnancy, antepartum on their problem list.  Patient reports daily nausea/vomiting (pt vomited once in room while waiting for appointment) and occasional bilateral nipple pain with discharge from right nipple if expressed. Pt has had workup for this recently, all negative for infection/cancerous causes. Stopped breastfeeding her son within 48mo of getting pregnant with this baby.  Contractions: Not present. Vag. Bleeding: None.  Movement: Present. Denies leaking of fluid.   The following portions of the patient's history were reviewed and updated as appropriate: allergies, current medications, past family history, past medical history, past social history, past surgical history and problem list.   Objective:   Vitals:   12/16/20 1057  BP: 118/75  Pulse: 93  Weight: 232 lb 1.6 oz (105.3 kg)    Fetal Status: Fetal Heart Rate (bpm): 158 Fundal Height: 18 cm Movement: Present     General:  Alert, oriented and cooperative. Patient is in no acute distress.  Skin: Skin is warm and dry. No rash noted. Nipples turned purple with white blanching when removed from pt's shirt.  Cardiovascular: Normal heart rate noted  Respiratory: Normal respiratory effort, no problems with respiration noted  Abdomen: Soft, gravid, appropriate for gestational age.  Pain/Pressure: Present     Pelvic: Cervical exam deferred        Extremities: Normal range of motion.  Edema: Trace  Mental Status: Normal mood and affect. Normal behavior. Normal judgment and thought  content.   Assessment and Plan:  Pregnancy: G3P1011 at [redacted]w[redacted]d 1. Encounter for supervision of normal first pregnancy in second trimester - Pt doing well other than constantly nauseated and on the verge of gagging. Amenable to medication.  2. [redacted] weeks gestation of pregnancy - Routine OB care, anticipatory guidance on future visits (at [facility])  3. Nausea/vomiting in pregnancy - Prescription for diclegis with zofran for breakthrough given, pt cautioned about constipation as side effect of zofran  4. Nipple discharge, antepartum - "discharge" from nipple is white, sometimes clear to yellow. Explained that with close spacing of pregnancy to weaning, complete involution would not have occurred (can take years) and what she is seeing is just colostrum. Pt expressed relief. - nipple discomfort with temp changes likely due to nipple spasms, demonstrated how to relieve pain by pinching just behind the nipple to end spasms. Advised pt that sharp temperature changes of any kind can cause this and to use extra padding in her bra to prevent spasms.  Preterm labor symptoms and general obstetric precautions including but not limited to vaginal bleeding, contractions, leaking of fluid and fetal movement were reviewed in detail with the patient. Please refer to After Visit Summary for other counseling recommendations.   Return in about 4 weeks (around 01/13/2021) for IN-PERSON, LOB.  Future Appointments  Date Time Provider [place]  12/21/2020  2:00 PM [facility] NURSE [facility]  12/21/2020  2:15 PM [facility] US2 [facility]  12/24/2020  9:45 AM [facility], Tanna Savoy, DO [facility] None    Gabriel Carina, CNM

## 2020-12-21 ENCOUNTER — Ambulatory Visit: Payer: Medicaid Other | Attending: Obstetrics and Gynecology | Admitting: *Deleted

## 2020-12-21 ENCOUNTER — Ambulatory Visit (HOSPITAL_BASED_OUTPATIENT_CLINIC_OR_DEPARTMENT_OTHER): Payer: Medicaid Other

## 2020-12-21 ENCOUNTER — Other Ambulatory Visit: Payer: Self-pay

## 2020-12-21 ENCOUNTER — Encounter: Payer: Self-pay | Admitting: *Deleted

## 2020-12-21 DIAGNOSIS — O99212 Obesity complicating pregnancy, second trimester: Secondary | ICD-10-CM | POA: Insufficient documentation

## 2020-12-21 DIAGNOSIS — O09522 Supervision of elderly multigravida, second trimester: Secondary | ICD-10-CM | POA: Diagnosis not present

## 2020-12-21 DIAGNOSIS — E039 Hypothyroidism, unspecified: Secondary | ICD-10-CM | POA: Diagnosis not present

## 2020-12-21 DIAGNOSIS — O99282 Endocrine, nutritional and metabolic diseases complicating pregnancy, second trimester: Secondary | ICD-10-CM | POA: Insufficient documentation

## 2020-12-21 DIAGNOSIS — Z3A11 11 weeks gestation of pregnancy: Secondary | ICD-10-CM

## 2020-12-21 DIAGNOSIS — Z3480 Encounter for supervision of other normal pregnancy, unspecified trimester: Secondary | ICD-10-CM

## 2020-12-21 DIAGNOSIS — Z363 Encounter for antenatal screening for malformations: Secondary | ICD-10-CM | POA: Insufficient documentation

## 2020-12-21 DIAGNOSIS — Z3A18 18 weeks gestation of pregnancy: Secondary | ICD-10-CM | POA: Insufficient documentation

## 2020-12-22 ENCOUNTER — Other Ambulatory Visit: Payer: Self-pay | Admitting: *Deleted

## 2020-12-22 ENCOUNTER — Other Ambulatory Visit: Payer: Medicaid Other

## 2020-12-22 DIAGNOSIS — R638 Other symptoms and signs concerning food and fluid intake: Secondary | ICD-10-CM

## 2020-12-24 ENCOUNTER — Ambulatory Visit (INDEPENDENT_AMBULATORY_CARE_PROVIDER_SITE_OTHER): Payer: Medicaid Other | Admitting: Family Medicine

## 2020-12-24 ENCOUNTER — Other Ambulatory Visit: Payer: Self-pay

## 2020-12-24 VITALS — BP 116/69 | HR 91 | Wt 228.0 lb

## 2020-12-24 DIAGNOSIS — Z3A19 19 weeks gestation of pregnancy: Secondary | ICD-10-CM

## 2020-12-24 DIAGNOSIS — E038 Other specified hypothyroidism: Secondary | ICD-10-CM

## 2020-12-24 DIAGNOSIS — F331 Major depressive disorder, recurrent, moderate: Secondary | ICD-10-CM

## 2020-12-24 DIAGNOSIS — Z3402 Encounter for supervision of normal first pregnancy, second trimester: Secondary | ICD-10-CM

## 2020-12-24 NOTE — Progress Notes (Signed)
° °  PRENATAL VISIT NOTE  Subjective:  Michele Armstrong is a 35 y.o. G3P1011 at [redacted]w[redacted]d being seen today for ongoing prenatal care.  She is currently monitored for the following issues for this high-risk pregnancy and has PTSD (post-traumatic stress disorder); Depression, major, recurrent ([place]); Hypothyroid; Prolapsed internal hemorrhoids, grade 2; Latex allergy; Stress incontinence, female; Supervision of other normal pregnancy, antepartum; and Obesity affecting pregnancy, antepartum on their problem list.  Patient reports having some allergic dermatitis. PCP has recommended Zyrtec. Also having increased panic attacks.  Contractions: Not present. Vag. Bleeding: None.  Movement: Present. Denies leaking of fluid.   The following portions of the patient's history were reviewed and updated as appropriate: allergies, current medications, past family history, past medical history, past social history, past surgical history and problem list.   Objective:   Vitals:   12/24/20 0955  BP: 116/69  Pulse: 91  Weight: 228 lb (103.4 kg)    Fetal Status: Fetal Heart Rate (bpm): 151 Fundal Height: 20 cm Movement: Present     General:  Alert, oriented and cooperative. Patient is in no acute distress.  Skin: Skin is warm and dry. No rash noted.   Cardiovascular: Normal heart rate noted  Respiratory: Normal respiratory effort, no problems with respiration noted  Abdomen: Soft, gravid, appropriate for gestational age.  Pain/Pressure: Present     Pelvic: Cervical exam deferred        Extremities: Normal range of motion.  Edema: Trace  Mental Status: Normal mood and affect. Normal behavior. Normal judgment and thought content.   Assessment and Plan:  Pregnancy: G3P1011 at [redacted]w[redacted]d 1. [redacted] weeks gestation of pregnancy - AFP, Serum, Open Spina Bifida  2. Encounter for supervision of normal first pregnancy in second trimester FHT and FH normal  3. Moderate episode of recurrent major depressive disorder  ([place]) Discussed possible occasional use of Klonopin if psychiatrist is okay with it. Would need to be limited use to avoid dependence in baby.   4. Other specified hypothyroidism On synthroid.  Check TSH. - TSH  Preterm labor symptoms and general obstetric precautions including but not limited to vaginal bleeding, contractions, leaking of fluid and fetal movement were reviewed in detail with the patient. Please refer to After Visit Summary for other counseling recommendations.   No follow-ups on file.  Future Appointments  Date Time Provider [place]  01/19/2021  1:45 PM [facility] NURSE [facility]  01/19/2021  2:00 PM [facility] US1 [facility]  01/22/2021 10:00 AM Truett Mainland, DO [facility] None    Truett Mainland, DO

## 2020-12-26 LAB — AFP, SERUM, OPEN SPINA BIFIDA
AFP MoM: 1
AFP Value: 38.8 ng/mL
Gest. Age on Collection Date: 19 weeks
Maternal Age At EDD: 35.5 yr
OSBR Risk 1 IN: 10000
Test Results:: NEGATIVE
Weight: 228 [lb_av]

## 2020-12-26 LAB — TSH: TSH: 0.911 u[IU]/mL (ref 0.450–4.500)

## 2021-01-05 DIAGNOSIS — F4323 Adjustment disorder with mixed anxiety and depressed mood: Secondary | ICD-10-CM | POA: Insufficient documentation

## 2021-01-06 ENCOUNTER — Other Ambulatory Visit: Payer: Self-pay

## 2021-01-06 ENCOUNTER — Inpatient Hospital Stay (HOSPITAL_BASED_OUTPATIENT_CLINIC_OR_DEPARTMENT_OTHER)
Admission: AD | Admit: 2021-01-06 | Discharge: 2021-01-06 | Disposition: A | Discharge status: Home / Self Care | Payer: Medicaid Other | Attending: Obstetrics & Gynecology | Admitting: Obstetrics & Gynecology

## 2021-01-06 ENCOUNTER — Inpatient Hospital Stay (HOSPITAL_COMMUNITY): Payer: Medicaid Other

## 2021-01-06 ENCOUNTER — Encounter (HOSPITAL_BASED_OUTPATIENT_CLINIC_OR_DEPARTMENT_OTHER): Payer: Self-pay

## 2021-01-06 DIAGNOSIS — R112 Nausea with vomiting, unspecified: Secondary | ICD-10-CM

## 2021-01-06 DIAGNOSIS — O219 Vomiting of pregnancy, unspecified: Secondary | ICD-10-CM | POA: Diagnosis not present

## 2021-01-06 DIAGNOSIS — O26892 Other specified pregnancy related conditions, second trimester: Secondary | ICD-10-CM | POA: Diagnosis not present

## 2021-01-06 DIAGNOSIS — Z87891 Personal history of nicotine dependence: Secondary | ICD-10-CM | POA: Diagnosis not present

## 2021-01-06 DIAGNOSIS — R1031 Right lower quadrant pain: Secondary | ICD-10-CM | POA: Insufficient documentation

## 2021-01-06 DIAGNOSIS — O210 Mild hyperemesis gravidarum: Secondary | ICD-10-CM | POA: Diagnosis not present

## 2021-01-06 DIAGNOSIS — Z3A20 20 weeks gestation of pregnancy: Secondary | ICD-10-CM | POA: Insufficient documentation

## 2021-01-06 DIAGNOSIS — R109 Unspecified abdominal pain: Secondary | ICD-10-CM

## 2021-01-06 LAB — CBC WITH DIFFERENTIAL/PLATELET
Abs Immature Granulocytes: 0.07 10*3/uL (ref 0.00–0.07)
Basophils Absolute: 0 10*3/uL (ref 0.0–0.1)
Basophils Relative: 0 %
Eosinophils Absolute: 0 10*3/uL (ref 0.0–0.5)
Eosinophils Relative: 0 %
HCT: 39.6 % (ref 36.0–46.0)
Hemoglobin: 13.5 g/dL (ref 12.0–15.0)
Immature Granulocytes: 0 %
Lymphocytes Relative: 7 %
Lymphs Abs: 1.1 10*3/uL (ref 0.7–4.0)
MCH: 30.1 pg (ref 26.0–34.0)
MCHC: 34.1 g/dL (ref 30.0–36.0)
MCV: 88.2 fL (ref 80.0–100.0)
Monocytes Absolute: 0.7 10*3/uL (ref 0.1–1.0)
Monocytes Relative: 4 %
Neutro Abs: 13.7 10*3/uL — ABNORMAL HIGH (ref 1.7–7.7)
Neutrophils Relative %: 89 %
Platelets: 296 10*3/uL (ref 150–400)
RBC: 4.49 MIL/uL (ref 3.87–5.11)
RDW: 13.5 % (ref 11.5–15.5)
WBC: 15.6 10*3/uL — ABNORMAL HIGH (ref 4.0–10.5)
nRBC: 0 % (ref 0.0–0.2)

## 2021-01-06 LAB — COMPREHENSIVE METABOLIC PANEL
ALT: 16 U/L (ref 0–44)
AST: 18 U/L (ref 15–41)
Albumin: 3.2 g/dL — ABNORMAL LOW (ref 3.5–5.0)
Alkaline Phosphatase: 66 U/L (ref 38–126)
Anion gap: 11 (ref 5–15)
BUN: <5 mg/dL — ABNORMAL LOW (ref 6–20)
CO2: 18 mmol/L — ABNORMAL LOW (ref 22–32)
Calcium: 8.8 mg/dL — ABNORMAL LOW (ref 8.9–10.3)
Chloride: 107 mmol/L (ref 98–111)
Creatinine, Ser: 0.53 mg/dL (ref 0.44–1.00)
GFR, Estimated: >60 mL/min (ref >60)
Glucose, Bld: 115 mg/dL — ABNORMAL HIGH (ref 70–99)
Potassium: 3.8 mmol/L (ref 3.5–5.1)
Sodium: 136 mmol/L (ref 135–145)
Total Bilirubin: 0.1 mg/dL — ABNORMAL LOW (ref 0.3–1.2)
Total Protein: 6.6 g/dL (ref 6.5–8.1)

## 2021-01-06 LAB — LIPASE, BLOOD: Lipase: 38 U/L (ref 11–51)

## 2021-01-06 MED ORDER — ONDANSETRON HCL 4 MG/2ML IJ SOLN
4.0000 mg | Freq: Once | INTRAMUSCULAR | Status: AC
  Administered 2021-01-06: 4 mg via INTRAVENOUS
  Filled 2021-01-06: qty 2

## 2021-01-06 MED ORDER — LACTATED RINGERS IV SOLN: INTRAVENOUS | Status: DC

## 2021-01-06 MED ORDER — FAMOTIDINE IN NACL 20-0.9 MG/50ML-% IV SOLN
20.0000 mg | Freq: Once | INTRAVENOUS | Status: AC
  Administered 2021-01-06: 20 mg via INTRAVENOUS
  Filled 2021-01-06: qty 50

## 2021-01-06 MED ORDER — ONDANSETRON HCL 4 MG PO TABS: 4.0000 mg | ORAL_TABLET | Freq: Three times a day (TID) | ORAL | 0 refills | Status: DC | PRN

## 2021-01-06 MED ORDER — OMEPRAZOLE 20 MG PO CPDR: 20.0000 mg | DELAYED_RELEASE_CAPSULE | Freq: Two times a day (BID) | ORAL | 1 refills | Status: DC

## 2021-01-06 MED ORDER — SODIUM CHLORIDE 0.9 % IV BOLUS
1000.0000 mL | Freq: Once | INTRAVENOUS | Status: AC
  Administered 2021-01-06: 1000 mL via INTRAVENOUS

## 2021-01-06 NOTE — ED Provider Notes (Signed)
[facility] EMERGENCY DEPARTMENT Provider Note   CSN: [id number] Arrival date & time: 01/06/21  1256     History Chief Complaint  Patient presents with  . Vomiting    Michele Armstrong is a 35 y.o. female.  35 y.o female with a PMH G3P1 currently [redacted] weeks pregnant presents to the ED with a chief complaint of emesis and abdominal pain x 4 hours ago. Patient reports waking up this morning and having multiple episodes of bilious episodes of vomiting.  She reports having extreme morning sickness of her last pregnancy but has not had any issues with this.  States she tried a Phenergan suppository about 2 hours ago, but he states she had one subsequent episode of emesis after.  She began to feel concerned as there was streaks of blood on her vomit.  He also reports yesterday feeling somewhat cramping along the lower side of her abdomen, more so on the right side than any.  States she tried to get soda along with eat some toast this morning but states she vomited right after.  She is followed by Dr. Marlou Starks, however when attempted to contact this office she reached the after-hours RN.  He has not had any fever, no vaginal bleeding, no gynecological complaints.  Last bowel movement this morning without any blood.  Does have a prior history of a cholecystectomy at 61.  The history is provided by the patient.       Past Medical History:  Diagnosis Date  . Anxiety   . Arrhythmia    tachycardia  . Depression   . Fatigue   . Headache(784.0)   . Hypothyroid   . Internal hemorrhoids   . PTSD (post-traumatic stress disorder)   . Seizures ([place])    Medication related seizure x 1    Patient Active Problem List   Diagnosis Date Noted  . Supervision of other normal pregnancy, antepartum 10/30/2020  . Obesity affecting pregnancy, antepartum 10/30/2020  . Stress incontinence, female 10/27/2017  . Prolapsed internal hemorrhoids, grade 2 06/07/2017  . Latex allergy 06/07/2017  . PTSD  (post-traumatic stress disorder) 08/22/2011  . Depression, major, recurrent ([place]) 08/22/2011  . Hypothyroid 08/22/2011    Past Surgical History:  Procedure Laterality Date  . CHOLECYSTECTOMY    . FLEXIBLE SIGMOIDOSCOPY N/A 06/01/2017   Procedure: FLEXIBLE SIGMOIDOSCOPY;  Surgeon: Milus Banister, MD;  Location: [facility];  Service: Endoscopy;  Laterality: N/A;  . HEMORRHOID BANDING  2018  . WISDOM TOOTH EXTRACTION       OB History    Gravida  3   Para  1   Term  1   Preterm      AB  1   Living  1     SAB      IAB      Ectopic      Multiple  0   Live Births  1           Family History  Problem Relation Age of Onset  . Alcohol abuse Father   . Drug abuse Father   . Hypertension Father   . Hypertension Mother   . Diabetes Mother   . Leukemia Maternal Grandmother   . Breast cancer Maternal Aunt   . Colon cancer Neg Hx   . Stomach cancer Neg Hx     Social History   Tobacco Use  . Smoking status: Former Smoker    Types: Cigarettes  . Smokeless tobacco: Never Used  Vaping Use  .  Vaping Use: Former  Substance Use Topics  . Alcohol use: Not Currently  . Drug use: Not Currently    Types: Marijuana, Other-see comments    Home Medications Prior to Admission medications   Medication Sig Start Date End Date Taking? Authorizing Provider  acetaminophen (TYLENOL) 325 MG tablet Take 650 mg by mouth every 6 (six) hours as needed. Patient not taking: No sig reported    [provider]  cetirizine (ZYRTEC) 10 MG tablet Take by mouth.    [provider]  Doxylamine-Pyridoxine (DICLEGIS) 10-10 MG TBEC Take 1-2 tablets by mouth at bedtime. Start by taking two tablets at bedtime on day 1 and 2; if symptoms persist, take 1 tablet in morning and 2 tablets at bedtime on day 3; if symptoms persist, may increase to 1 tablet in morning, 1 tablet mid-afternoon, and 2 tablets at bedtime on day 4 (maximum: doxylamine 40 mg/pyridoxine 40 mg (4 tablets)  per day). Patient not taking: Reported on 12/24/2020 12/19/20   Gabriel Carina, CNM  DULoxetine (CYMBALTA) 60 MG capsule Take 60 mg by mouth daily. 08/31/20   [provider]  levothyroxine (SYNTHROID) 100 MCG tablet Take 1 tablet (100 mcg total) by mouth daily before breakfast. 12/01/20   Truett Mainland, DO  omeprazole (PRILOSEC) 20 MG capsule Take 1 capsule (20 mg total) by mouth daily. 11/26/20   Truett Mainland, DO  ondansetron (ZOFRAN ODT) 8 MG disintegrating tablet Take 1 tablet (8 mg total) by mouth every 8 (eight) hours as needed for nausea or vomiting. Patient not taking: Reported on 12/24/2020 12/19/20   Gaylan Gerold R, CNM  Prenat-FeAsp-Meth-FA-DHA w/o A (PRENATE PIXIE) 10-0.6-0.4-200 MG CAPS Take 1 capsule by mouth daily.    [provider]  sulfamethoxazole-trimethoprim (BACTRIM DS) 800-160 MG tablet Take 1 tablet by mouth 2 (two) times daily. Patient not taking: No sig reported 11/23/20   Truett Mainland, DO    Allergies    Morphine and related and Latex  Review of Systems   Review of Systems  Constitutional: Negative for fever.  HENT: Negative for sore throat.   Respiratory: Negative for shortness of breath.   Cardiovascular: Negative for chest pain.  Gastrointestinal: Positive for abdominal pain, nausea and vomiting. Negative for blood in stool and diarrhea.  Genitourinary: Negative for difficulty urinating, flank pain, hematuria, vaginal bleeding and vaginal discharge.  Musculoskeletal: Negative for back pain.  Skin: Negative for pallor and wound.  Neurological: Negative for light-headedness and headaches.  All other systems reviewed and are negative.   Physical Exam Updated Vital Signs BP 100/68 (BP Location: Left Arm)   Pulse 98   Temp 98.3 F (36.8 C) (Oral)   Resp 18   Ht 5\' 4"  (1.626 m)   Wt 101.7 kg   LMP 08/13/2020 (Exact Date)   SpO2 98%   BMI 38.48 kg/m   Physical Exam Vitals and nursing note reviewed.  Constitutional:       Appearance: Normal appearance.     Comments: Nontoxic, non-ill-appearing.  HENT:     Head: Normocephalic and atraumatic.     Nose: Nose normal.     Mouth/Throat:     Mouth: Mucous membranes are moist.  Eyes:     Pupils: Pupils are equal, round, and reactive to light.  Cardiovascular:     Rate and Rhythm: Normal rate.  Pulmonary:     Effort: Pulmonary effort is normal.  Abdominal:     General: Abdomen is flat.     Tenderness:  There is no abdominal tenderness.     Comments: Palpable fundus above the umbilicus.   Musculoskeletal:     Cervical back: Normal range of motion and neck supple.  Skin:    General: Skin is warm and dry.  Neurological:     Mental Status: She is alert and oriented to person, place, and time.     ED Results / Procedures / Treatments   Labs (all labs ordered are listed, but only abnormal results are displayed) Labs Reviewed  CBC WITH DIFFERENTIAL/PLATELET - Abnormal; Notable for the following components:      Result Value   WBC 15.6 (*)    Neutro Abs 13.7 (*)    All other components within normal limits  COMPREHENSIVE METABOLIC PANEL - Abnormal; Notable for the following components:   CO2 18 (*)    Glucose, Bld 115 (*)    BUN <5 (*)    Calcium 8.8 (*)    Albumin 3.2 (*)    Total Bilirubin 0.1 (*)    All other components within normal limits  LIPASE, BLOOD    EKG None  Radiology No results found.  Procedures Procedures   Medications Ordered in ED Medications  sodium chloride 0.9 % bolus 1,000 mL (1,000 mLs Intravenous New Bag/Given 01/06/21 1351)    ED Course  I have reviewed the triage vital signs and the nursing notes.  Pertinent labs & imaging results that were available during my care of the patient were reviewed by me and considered in my medical decision making (see chart for details).    MDM Rules/Calculators/A&P  G3 P1 currently [redacted] weeks pregnant presents to the ED with multiple episodes of emesis, abdominal cramping which  began 4 hours prior to arrival in the ED.  Attempted Phenergan suppository but had one subsequent episode of emesis.  Reports cramping pain along the right lower quadrant which she first noticed yesterday and has continued into today, she has not taken anything for pain.  Attempted to contact her OB/GYN without success and arrived in the ED.  During primary evaluation patient is overall nonill, nontoxic-appearing, emesis bag is at the bedside but it is empty.  Vitals are within normal limits, she is afebrile, heart rate slightly increased with suspect due to ambulation into the room.  Abdomen is soft, fundus is palpable above the umbilicus.  Lungs are clear to auscultation, chest is nontender to palpation.  There is no swelling of her legs, no pitting edema noted.  Interpretation of her labs reveal a CBC with a leukocytosis of 15.6, hemoglobin is within normal limits.  CMP with some crease in her CO2, slight ovation of her glucose.  LFTs are within normal limits.  Lipase level is within normal limits.  These results were discussed with patient who has not had any following episodes of vomiting after medication, she is currently receiving a bolus.  She does report she has had some lack of appetite, nausea and vomiting name, she is concerned for appendicitis at this time.  We discussed risks and benefits of obtaining MRI imaging.  I have discussed case with my attending Dr. Tomi Bamberger who is agreeable of treatment and plan.  2:42 PM Spoke to [facility] MAU provider who recommended transfer to MAU.  Patient will go via POV, mother will be transporting patient.    Portions of this note were generated with Lobby[employer]. Dictation errors may occur despite best attempts at proofreading.  Final Clinical Impression(s) / ED Diagnoses Final diagnoses:  Non-intractable vomiting  with nausea, unspecified vomiting type    Rx / DC Orders ED Discharge Orders    None       Janeece Fitting, PA-C 01/06/21  1448    Dorie Rank, MD 01/07/21 1540

## 2021-01-06 NOTE — ED Notes (Signed)
Report to [place] [facility]

## 2021-01-06 NOTE — ED Triage Notes (Addendum)
Pt c/o n/v since 930am-states blood in emesis in the last hour-states she was advised to come to ED by OB-states she is [redacted] weeks pregnant-NAD-steady gait-pain site RLQ x 4 days

## 2021-01-06 NOTE — MAU Note (Signed)
Sent from [facility] for further evaluation of possible appendicitis.

## 2021-01-06 NOTE — MAU Provider Note (Signed)
Chief Complaint: Vomiting and Abdominal Pain   Event Date/Time   First Provider Initiated Contact with Patient 01/06/21 1708      SUBJECTIVE HPI: Michele Armstrong is a 35 y.o. G3P1011 at [redacted]w[redacted]d who presents to maternity admissions sent from [facility] for RLQ pain and n/v. Pt reports RLQ pain started 4 days ago and has been mild but persistent. She has n/v daily that is  She came to Urgent Care today mostly because she had light red bleeding in her emesis and she was concerned.   She denies vaginal bleeding, vaginal itching/burning, urinary symptoms, h/a, dizziness, n/v, or fever/chills.     Location: RLQ Quality: sharp, cramping Severity: 5/10 on pain scale Duration: 4 days Timing: intermittent Modifying factors: none Associated signs and symptoms: n/v  HPI  Past Medical History:  Diagnosis Date  . Anxiety   . Arrhythmia    tachycardia  . Depression   . Fatigue   . Headache(784.0)   . Hypothyroid   . Internal hemorrhoids   . PTSD (post-traumatic stress disorder)   . Seizures ([place])    Medication related seizure x 1   Past Surgical History:  Procedure Laterality Date  . CHOLECYSTECTOMY    . FLEXIBLE SIGMOIDOSCOPY N/A 06/01/2017   Procedure: FLEXIBLE SIGMOIDOSCOPY;  Surgeon: Milus Banister, MD;  Location: [facility];  Service: Endoscopy;  Laterality: N/A;  . HEMORRHOID BANDING  2018  . WISDOM TOOTH EXTRACTION     Social History   Socioeconomic History  . Marital status: Divorced    Spouse name: Not on file  . Number of children: 1  . Years of education: Not on file  . Highest education level: Not on file  Occupational History  . Occupation: homemaker  Tobacco Use  . Smoking status: Former Smoker    Types: Cigarettes  . Smokeless tobacco: Never Used  . Tobacco comment: quit smoking about 2 days ago  Vaping Use  . Vaping Use: Former  Substance and Sexual Activity  . Alcohol use: Not Currently  . Drug use: Not Currently    Types: Marijuana,  Other-see comments  . Sexual activity: Yes    Partners: Male  Other Topics Concern  . Not on file  Social History Narrative   Married, homemaker, son Malachi born 2018      Patient has moved back to [place] from [place] due to FOB Mike Gip having mental issues (schizo with hallucinations)   Social Determinants of Health   Financial Resource Strain: Not on file  Food Insecurity: No Food Insecurity  . Worried About Charity fundraiser in the Last Year: Never true  . Ran Out of Food in the Last Year: Never true  Transportation Needs: No Transportation Needs  . Lack of Transportation (Medical): No  . Lack of Transportation (Non-Medical): No  Physical Activity: Not on file  Stress: Not on file  Social Connections: Not on file  Intimate Partner Violence: Not on file   No current facility-administered medications on file prior to encounter.   Current Outpatient Medications on File Prior to Encounter  Medication Sig Dispense Refill  . DULoxetine (CYMBALTA) 60 MG capsule Take 60 mg by mouth daily.    . Prenat-FeAsp-Meth-FA-DHA w/o A (PRENATE PIXIE) 10-0.6-0.4-200 MG CAPS Take 1 capsule by mouth daily.    Marland Kitchen acetaminophen (TYLENOL) 325 MG tablet Take 650 mg by mouth every 6 (six) hours as needed. (Patient not taking: No sig reported)    . cetirizine (ZYRTEC) 10 MG tablet  Take by mouth.    . Doxylamine-Pyridoxine (DICLEGIS) 10-10 MG TBEC Take 1-2 tablets by mouth at bedtime. Start by taking two tablets at bedtime on day 1 and 2; if symptoms persist, take 1 tablet in morning and 2 tablets at bedtime on day 3; if symptoms persist, may increase to 1 tablet in morning, 1 tablet mid-afternoon, and 2 tablets at bedtime on day 4 (maximum: doxylamine 40 mg/pyridoxine 40 mg (4 tablets) per day). (Patient not taking: Reported on 12/24/2020) 60 tablet 2  . levothyroxine (SYNTHROID) 100 MCG tablet Take 1 tablet (100 mcg total) by mouth daily before breakfast. 30 tablet 3  . ondansetron (ZOFRAN  ODT) 8 MG disintegrating tablet Take 1 tablet (8 mg total) by mouth every 8 (eight) hours as needed for nausea or vomiting. (Patient not taking: Reported on 12/24/2020) 20 tablet 0  . sulfamethoxazole-trimethoprim (BACTRIM DS) 800-160 MG tablet Take 1 tablet by mouth 2 (two) times daily. (Patient not taking: No sig reported) 14 tablet 1   Allergies  Allergen Reactions  . Morphine And Related Nausea And Vomiting  . Latex Rash    Causes UTI when condom used.    ROS:  Review of Systems  Constitutional: Negative for chills, fatigue and fever.  Respiratory: Negative for shortness of breath.   Cardiovascular: Negative for chest pain.  Gastrointestinal: Positive for abdominal pain, nausea and vomiting.  Genitourinary: Negative for difficulty urinating, dysuria, flank pain, pelvic pain, vaginal bleeding, vaginal discharge and vaginal pain.  Neurological: Negative for dizziness and headaches.  Psychiatric/Behavioral: Negative.      I have reviewed patient's Past Medical Hx, Surgical Hx, Family Hx, Social Hx, medications and allergies.   Physical Exam   Patient Vitals for the past 24 hrs:  BP Temp Temp src Pulse Resp SpO2 Height Weight  01/06/21 2126 127/64 -- -- 83 16 100 % -- --  01/06/21 1643 112/67 -- -- 100 -- -- -- --  01/06/21 1621 95/77 97.6 F (36.4 C) Oral (!) 104 18 99 % -- --  01/06/21 1522 -- 98.2 F (36.8 C) Oral (!) 102 -- 98 % -- --  01/06/21 1500 102/62 -- -- 90 19 100 % -- --  01/06/21 1430 101/64 -- -- 78 -- 94 % -- --  01/06/21 1400 100/68 -- -- 98 18 98 % -- --  01/06/21 1330 125/77 -- -- (!) 103 -- 100 % -- --  01/06/21 1314 -- -- -- -- -- -- 5\' 4"  (1.626 m) 101.7 kg  01/06/21 1311 114/87 98.3 F (36.8 C) Oral 99 20 100 % -- --   Constitutional: Well-developed, well-nourished female in no acute distress.  Cardiovascular: normal rate Respiratory: normal effort GI: Abd soft, mild RLQ tenderness. No rebound tenderness. Pos BS x 4 MS: Extremities nontender, no  edema, normal ROM Neurologic: Alert and oriented x 4.  GU: Neg CVAT.  PELVIC EXAM: Deferred  FHT 161 by doppler  LAB RESULTS Results for orders placed or performed during the hospital encounter of 01/06/21 (from the past 24 hour(s))  CBC with Differential     Status: Abnormal   Collection Time: 01/06/21  1:48 PM  Result Value Ref Range   WBC 15.6 (H) 4.0 - 10.5 K/uL   RBC 4.49 3.87 - 5.11 MIL/uL   Hemoglobin 13.5 12.0 - 15.0 g/dL   HCT 39.6 36.0 - 46.0 %   MCV 88.2 80.0 - 100.0 fL   MCH 30.1 26.0 - 34.0 pg   MCHC 34.1 30.0 - 36.0 g/dL  RDW 13.5 11.5 - 15.5 %   Platelets 296 150 - 400 K/uL   nRBC 0.0 0.0 - 0.2 %   Neutrophils Relative % 89 %   Neutro Abs 13.7 (H) 1.7 - 7.7 K/uL   Lymphocytes Relative 7 %   Lymphs Abs 1.1 0.7 - 4.0 K/uL   Monocytes Relative 4 %   Monocytes Absolute 0.7 0.1 - 1.0 K/uL   Eosinophils Relative 0 %   Eosinophils Absolute 0.0 0.0 - 0.5 K/uL   Basophils Relative 0 %   Basophils Absolute 0.0 0.0 - 0.1 K/uL   Immature Granulocytes 0 %   Abs Immature Granulocytes 0.07 0.00 - 0.07 K/uL  Comprehensive metabolic panel     Status: Abnormal   Collection Time: 01/06/21  1:48 PM  Result Value Ref Range   Sodium 136 135 - 145 mmol/L   Potassium 3.8 3.5 - 5.1 mmol/L   Chloride 107 98 - 111 mmol/L   CO2 18 (L) 22 - 32 mmol/L   Glucose, Bld 115 (H) 70 - 99 mg/dL   BUN <5 (L) 6 - 20 mg/dL   Creatinine, Ser 0.53 0.44 - 1.00 mg/dL   Calcium 8.8 (L) 8.9 - 10.3 mg/dL   Total Protein 6.6 6.5 - 8.1 g/dL   Albumin 3.2 (L) 3.5 - 5.0 g/dL   AST 18 15 - 41 U/L   ALT 16 0 - 44 U/L   Alkaline Phosphatase 66 38 - 126 U/L   Total Bilirubin 0.1 (L) 0.3 - 1.2 mg/dL   GFR, Estimated >60 >60 mL/min   Anion gap 11 5 - 15  Lipase, blood     Status: None   Collection Time: 01/06/21  1:48 PM  Result Value Ref Range   Lipase 38 11 - 51 U/L    O/Positive/-- (01/28 0959)  IMAGING   MAU Management/MDM: Orders Placed This Encounter  Procedures  . MR PELVIS WO  CONTRAST  . MR ABDOMEN WO CONTRAST  . CBC with Differential  . Comprehensive metabolic panel  . Lipase, blood  . Discharge patient    Meds ordered this encounter  Medications  . sodium chloride 0.9 % bolus 1,000 mL  . lactated ringers infusion  . famotidine (PEPCID) IVPB 20 mg premix  . ondansetron (ZOFRAN) injection 4 mg  . ondansetron (ZOFRAN) 4 MG tablet    Sig: Take 1 tablet (4 mg total) by mouth every 8 (eight) hours as needed for nausea or vomiting.    Dispense:  20 tablet    Refill:  0    Order Specific Question:   Supervising Provider    Answer:   Janyth Pupa [[id number]]  . omeprazole (PRILOSEC) 20 MG capsule    Sig: Take 1 capsule (20 mg total) by mouth in the morning and at bedtime.    Dispense:  90 capsule    Refill:  1    Order Specific Question:   Supervising Provider    Answer:   Janyth Pupa [[id number]]    Pt with mildly elevated WBCs that may be c/w pregnancy but with hx of RLQ pain x 4 days followed by worsening n/v of pregnancy.  Given presentation, will send for MRI although she does not present with acute abdomen.    MRI abdomen/pelvis without acute findings  D/C home Rx for Zofran PO, changed from ODT to pills per pt request, Rx for Omeprazole renewed and sent to current pharmacy.  Precautions given, pt to f/u in office as scheduled   ASSESSMENT 1.  Acute right lower quadrant pain   2. Non-intractable vomiting with nausea, unspecified vomiting type   3. Nausea and vomiting during pregnancy prior to [redacted] weeks gestation   4. Abdominal pain during pregnancy in second trimester   5. [redacted] weeks gestation of pregnancy     PLAN Discharge home Allergies as of 01/06/2021      Reactions   Morphine And Related Nausea And Vomiting   Latex Rash   Causes UTI when condom used.      Medication List    STOP taking these medications   ondansetron 8 MG disintegrating tablet Commonly known as: Zofran ODT     TAKE these medications   acetaminophen 325 MG  tablet Commonly known as: TYLENOL Take 650 mg by mouth every 6 (six) hours as needed.   cetirizine 10 MG tablet Commonly known as: ZYRTEC Take by mouth.   Doxylamine-Pyridoxine 10-10 MG Tbec Commonly known as: Diclegis Take 1-2 tablets by mouth at bedtime. Start by taking two tablets at bedtime on day 1 and 2; if symptoms persist, take 1 tablet in morning and 2 tablets at bedtime on day 3; if symptoms persist, may increase to 1 tablet in morning, 1 tablet mid-afternoon, and 2 tablets at bedtime on day 4 (maximum: doxylamine 40 mg/pyridoxine 40 mg (4 tablets) per day).   DULoxetine 60 MG capsule Commonly known as: CYMBALTA Take 60 mg by mouth daily.   levothyroxine 100 MCG tablet Commonly known as: Synthroid Take 1 tablet (100 mcg total) by mouth daily before breakfast.   omeprazole 20 MG capsule Commonly known as: PriLOSEC Take 1 capsule (20 mg total) by mouth in the morning and at bedtime. What changed: when to take this   ondansetron 4 MG tablet Commonly known as: Zofran Take 1 tablet (4 mg total) by mouth every 8 (eight) hours as needed for nausea or vomiting.   Prenate Pixie 10-0.6-0.4-200 MG Caps Take 1 capsule by mouth daily.   sulfamethoxazole-trimethoprim 800-160 MG tablet Commonly known as: BACTRIM DS Take 1 tablet by mouth 2 (two) times daily.       Follow-up [facility] Follow up.   Specialty: Obstetrics and Gynecology Why: As scheduled, return to MAU as needed for emergencies. Contact information: [address] [place] [address] [place] Certified Nurse-Midwife 01/06/2021  9:45 PM

## 2021-01-06 NOTE — ED Notes (Signed)
FHT 152 mom excited to hear baby

## 2021-01-06 NOTE — Progress Notes (Signed)
Lisa Leftwich-Kirby CNM in earlier to discuss test results and d/c plan. Written and verbal d/c instructions given and understanding voiced. 

## 2021-01-19 ENCOUNTER — Other Ambulatory Visit: Payer: Self-pay

## 2021-01-19 ENCOUNTER — Other Ambulatory Visit: Payer: Self-pay | Admitting: *Deleted

## 2021-01-19 ENCOUNTER — Ambulatory Visit (HOSPITAL_BASED_OUTPATIENT_CLINIC_OR_DEPARTMENT_OTHER): Payer: Medicaid Other

## 2021-01-19 ENCOUNTER — Other Ambulatory Visit
Admission: RE | Admit: 2021-01-19 | Discharge: 2021-01-19 | Disposition: A | Discharge status: Home / Self Care | Payer: Medicaid Other | Source: Ambulatory Visit | Attending: Advanced Practice Midwife | Admitting: Advanced Practice Midwife

## 2021-01-19 ENCOUNTER — Encounter: Payer: Self-pay | Admitting: *Deleted

## 2021-01-19 ENCOUNTER — Ambulatory Visit: Payer: Medicaid Other | Admitting: *Deleted

## 2021-01-19 ENCOUNTER — Ambulatory Visit: Payer: Medicaid Other

## 2021-01-19 VITALS — BP 117/73 | HR 103 | Ht 64.0 in | Wt 225.0 lb

## 2021-01-19 DIAGNOSIS — Z3480 Encounter for supervision of other normal pregnancy, unspecified trimester: Secondary | ICD-10-CM

## 2021-01-19 DIAGNOSIS — R638 Other symptoms and signs concerning food and fluid intake: Secondary | ICD-10-CM

## 2021-01-19 DIAGNOSIS — O99282 Endocrine, nutritional and metabolic diseases complicating pregnancy, second trimester: Secondary | ICD-10-CM

## 2021-01-19 DIAGNOSIS — Z3A22 22 weeks gestation of pregnancy: Secondary | ICD-10-CM

## 2021-01-19 DIAGNOSIS — N898 Other specified noninflammatory disorders of vagina: Secondary | ICD-10-CM

## 2021-01-19 DIAGNOSIS — O99212 Obesity complicating pregnancy, second trimester: Secondary | ICD-10-CM | POA: Diagnosis not present

## 2021-01-19 DIAGNOSIS — E669 Obesity, unspecified: Secondary | ICD-10-CM

## 2021-01-19 DIAGNOSIS — E039 Hypothyroidism, unspecified: Secondary | ICD-10-CM

## 2021-01-19 DIAGNOSIS — Z363 Encounter for antenatal screening for malformations: Secondary | ICD-10-CM

## 2021-01-19 DIAGNOSIS — O09522 Supervision of elderly multigravida, second trimester: Secondary | ICD-10-CM

## 2021-01-19 DIAGNOSIS — O321XX0 Maternal care for breech presentation, not applicable or unspecified: Secondary | ICD-10-CM

## 2021-01-20 ENCOUNTER — Telehealth: Payer: Self-pay

## 2021-01-20 LAB — CERVICOVAGINAL ANCILLARY ONLY
Bacterial Vaginitis (gardnerella): NEGATIVE
Candida Glabrata: NEGATIVE
Candida Vaginitis: POSITIVE — AB
Comment: NEGATIVE
Comment: NEGATIVE
Comment: NEGATIVE

## 2021-01-20 MED ORDER — TERCONAZOLE 0.4 % VA CREA: 1.0000 | TOPICAL_CREAM | Freq: Every day | VAGINAL | 0 refills | Status: DC

## 2021-01-20 NOTE — Telephone Encounter (Signed)
Patient aware of results and per protocol terconazole rx sent to pharmacy.

## 2021-01-22 ENCOUNTER — Other Ambulatory Visit: Payer: Self-pay

## 2021-01-22 ENCOUNTER — Ambulatory Visit (INDEPENDENT_AMBULATORY_CARE_PROVIDER_SITE_OTHER): Payer: Medicaid Other | Admitting: Family Medicine

## 2021-01-22 VITALS — BP 123/79 | HR 113 | Wt 223.0 lb

## 2021-01-22 DIAGNOSIS — F319 Bipolar disorder, unspecified: Secondary | ICD-10-CM

## 2021-01-22 DIAGNOSIS — E038 Other specified hypothyroidism: Secondary | ICD-10-CM

## 2021-01-22 DIAGNOSIS — Z3480 Encounter for supervision of other normal pregnancy, unspecified trimester: Secondary | ICD-10-CM

## 2021-01-22 DIAGNOSIS — Z3A23 23 weeks gestation of pregnancy: Secondary | ICD-10-CM

## 2021-01-22 DIAGNOSIS — R1011 Right upper quadrant pain: Secondary | ICD-10-CM

## 2021-01-22 NOTE — Progress Notes (Signed)
   PRENATAL VISIT NOTE  Subjective:  Michele Armstrong is a 35 y.o. G3P1011 at [redacted]w[redacted]d being seen today for ongoing prenatal care.  She is currently monitored for the following issues for this high-risk pregnancy and has PTSD (post-traumatic stress disorder); Depression, major, recurrent ([place]); Hypothyroid; Prolapsed internal hemorrhoids, grade 2; Latex allergy; Stress incontinence, female; Supervision of other normal pregnancy, antepartum; Obesity affecting pregnancy, antepartum; Adjustment disorder with mixed anxiety and depressed mood; Bipolar 1 disorder ([place]); Chronic back pain; Chronic pain of both knees; Elevated LFTs; Gastroesophageal reflux disease; Hemorrhoids; Insomnia; Large breasts; Nevus of right shoulder; Obesity, Class III, BMI 40-49.9 (morbid obesity) ([place]); and Pain aggravated by breast feeding on their problem list.  Patient reports intermittent RUQ abdominal pain. Nonradiating. no palliating or provoking factors. Comes in waves and is severe when it happens..  Contractions: Irritability. Vag. Bleeding: None.  Movement: Present. Denies leaking of fluid.   The following portions of the patient's history were reviewed and updated as appropriate: allergies, current medications, past family history, past medical history, past social history, past surgical history and problem list.   Objective:   Vitals:   01/22/21 1006  BP: 123/79  Pulse: (!) 113  Weight: 223 lb (101.2 kg)    Fetal Status: Fetal Heart Rate (bpm): 140   Movement: Present     General:  Alert, oriented and cooperative. Patient is in no acute distress.  Skin: Skin is warm and dry. No rash noted.   Cardiovascular: Normal heart rate noted  Respiratory: Normal respiratory effort, no problems with respiration noted  Abdomen: Soft, gravid, appropriate for gestational age.  Pain/Pressure: Absent     Pelvic: Cervical exam deferred        Extremities: Normal range of motion.  Edema: Trace  Mental Status: Normal mood and  affect. Normal behavior. Normal judgment and thought content.   Assessment and Plan:  Pregnancy: G3P1011 at [redacted]w[redacted]d 1. Supervision of other normal pregnancy, antepartum FHT and Fh normal  2. [redacted] weeks gestation of pregnancy  3. Bipolar 1 disorder ([place]) Controlled on cymbalta  4. Obesity, Class III, BMI 40-49.9 (morbid obesity) ([place])  5. Other specified hypothyroidism On synthroid 141mcg daily  6. RUQ abdominal pain S/p cholecystectomy. Check LFTs. - Comp Met (CMET)  Preterm labor symptoms and general obstetric precautions including but not limited to vaginal bleeding, contractions, leaking of fluid and fetal movement were reviewed in detail with the patient. Please refer to After Visit Summary for other counseling recommendations.   No follow-ups on file.  Future Appointments  Date Time Provider [place]  02/23/2021  1:30 PM [facility] NURSE [facility]  02/23/2021  1:45 PM [facility] [place], DO

## 2021-01-23 LAB — COMPREHENSIVE METABOLIC PANEL
ALT: 13 IU/L (ref 0–32)
AST: 12 IU/L (ref 0–40)
Albumin/Globulin Ratio: 1.6 (ref 1.2–2.2)
Albumin: 3.5 g/dL — ABNORMAL LOW (ref 3.8–4.8)
Alkaline Phosphatase: 83 IU/L (ref 44–121)
BUN/Creatinine Ratio: 11 (ref 9–23)
BUN: 6 mg/dL (ref 6–20)
Bilirubin Total: <0.2 mg/dL (ref 0.0–1.2)
CO2: 18 mmol/L — ABNORMAL LOW (ref 20–29)
Calcium: 9.1 mg/dL (ref 8.7–10.2)
Chloride: 104 mmol/L (ref 96–106)
Creatinine, Ser: 0.57 mg/dL (ref 0.57–1.00)
Globulin, Total: 2.2 g/dL (ref 1.5–4.5)
Glucose: 96 mg/dL (ref 65–99)
Potassium: 3.9 mmol/L (ref 3.5–5.2)
Sodium: 139 mmol/L (ref 134–144)
Total Protein: 5.7 g/dL — ABNORMAL LOW (ref 6.0–8.5)
eGFR: 121 mL/min/{1.73_m2} (ref >59)

## 2021-01-26 MED ORDER — CLOTRIMAZOLE 1 % EX CREA: 1.0000 "application " | TOPICAL_CREAM | Freq: Two times a day (BID) | CUTANEOUS | 0 refills | Status: DC

## 2021-02-15 ENCOUNTER — Ambulatory Visit (INDEPENDENT_AMBULATORY_CARE_PROVIDER_SITE_OTHER): Payer: Medicaid Other | Admitting: Family Medicine

## 2021-02-15 ENCOUNTER — Other Ambulatory Visit: Payer: Self-pay

## 2021-02-15 VITALS — BP 116/61 | HR 85 | Wt 229.0 lb

## 2021-02-15 DIAGNOSIS — Z23 Encounter for immunization: Secondary | ICD-10-CM | POA: Diagnosis not present

## 2021-02-15 DIAGNOSIS — O99282 Endocrine, nutritional and metabolic diseases complicating pregnancy, second trimester: Secondary | ICD-10-CM

## 2021-02-15 DIAGNOSIS — Z3480 Encounter for supervision of other normal pregnancy, unspecified trimester: Secondary | ICD-10-CM

## 2021-02-15 DIAGNOSIS — Z3A26 26 weeks gestation of pregnancy: Secondary | ICD-10-CM

## 2021-02-15 DIAGNOSIS — O926 Galactorrhea: Secondary | ICD-10-CM

## 2021-02-15 DIAGNOSIS — E038 Other specified hypothyroidism: Secondary | ICD-10-CM

## 2021-02-15 MED ORDER — SULFAMETHOXAZOLE-TRIMETHOPRIM 800-160 MG PO TABS
1.0000 | ORAL_TABLET | Freq: Two times a day (BID) | ORAL | 0 refills | Status: AC
Start: 2021-02-15 — End: 2021-02-25

## 2021-02-15 NOTE — Progress Notes (Signed)
   PRENATAL VISIT NOTE  Subjective:  Michele Armstrong is a 35 y.o. G3P1011 at [redacted]w[redacted]d being seen today for ongoing prenatal care.  She is currently monitored for the following issues for this low-risk pregnancy and has PTSD (post-traumatic stress disorder); Depression, major, recurrent ([place]); Hypothyroid; Prolapsed internal hemorrhoids, grade 2; Latex allergy; Stress incontinence, female; Supervision of other normal pregnancy, antepartum; Obesity affecting pregnancy, antepartum; Adjustment disorder with mixed anxiety and depressed mood; Bipolar 1 disorder ([place]); Chronic back pain; Chronic pain of both knees; Elevated LFTs; Gastroesophageal reflux disease; Hemorrhoids; Insomnia; Large breasts; Nevus of right shoulder; Obesity, Class III, BMI 40-49.9 (morbid obesity) ([place]); and Pain aggravated by breast feeding on their problem list.  Patient reports no complaints.  Contractions: Not present. Vag. Bleeding: None.  Movement: Present. Denies leaking of fluid.   The following portions of the patient's history were reviewed and updated as appropriate: allergies, current medications, past family history, past medical history, past social history, past surgical history and problem list.   Objective:   Vitals:   02/15/21 0849  BP: 116/61  Pulse: 85  Weight: 229 lb (103.9 kg)    Fetal Status: Fetal Heart Rate (bpm): 140 Fundal Height: 28 cm Movement: Present     General:  Alert, oriented and cooperative. Patient is in no acute distress.  Skin: Skin is warm and dry. No rash noted.   Cardiovascular: Normal heart rate noted  Respiratory: Normal respiratory effort, no problems with respiration noted  Abdomen: Soft, gravid, appropriate for gestational age.  Pain/Pressure: Present     Pelvic: Cervical exam deferred        Extremities: Normal range of motion.  Edema: Trace  Mental Status: Normal mood and affect. Normal behavior. Normal judgment and thought content.   Assessment and Plan:  Pregnancy:  G3P1011 at [redacted]w[redacted]d 1. [redacted] weeks gestation of pregnancy  2. Supervision of other normal pregnancy, antepartum Continue routine prenatal care. 28 wk labs today - CBC - Glucose Tolerance, 2 Hours w/1 Hour - HIV Antibody (routine testing w rflx) - RPR - Tdap vaccine greater than or equal to 7yo IM  3. Other specified hypothyroidism Recheck level - TSH  4. Nipple discharge, antepartum Previously grew staph species--will retreat - sulfamethoxazole-trimethoprim (BACTRIM DS) 800-160 MG tablet; Take 1 tablet by mouth 2 (two) times daily for 10 days.  Dispense: 20 tablet; Refill: 0  Preterm labor symptoms and general obstetric precautions including but not limited to vaginal bleeding, contractions, leaking of fluid and fetal movement were reviewed in detail with the patient. Please refer to After Visit Summary for other counseling recommendations.   Return in 2 weeks (on 03/01/2021).  Future Appointments  Date Time Provider [place]  02/23/2021  1:30 PM [facility] NURSE [facility]  02/23/2021  1:45 PM [facility]  03/02/2021  9:45 AM [place]ron Spates, CNM [facility] None    Donnamae Jude, MD

## 2021-02-15 NOTE — Patient Instructions (Signed)

## 2021-02-17 ENCOUNTER — Encounter: Payer: Self-pay | Admitting: Family Medicine

## 2021-02-17 ENCOUNTER — Telehealth: Payer: Self-pay

## 2021-02-17 DIAGNOSIS — O24419 Gestational diabetes mellitus in pregnancy, unspecified control: Secondary | ICD-10-CM | POA: Insufficient documentation

## 2021-02-17 LAB — CBC
Hematocrit: 35.5 % (ref 34.0–46.6)
Hemoglobin: 11.7 g/dL (ref 11.1–15.9)
MCH: 29.4 pg (ref 26.6–33.0)
MCHC: 33 g/dL (ref 31.5–35.7)
MCV: 89 fL (ref 79–97)
Platelets: 255 10*3/uL (ref 150–450)
RBC: 3.98 x10E6/uL (ref 3.77–5.28)
RDW: 13.3 % (ref 11.7–15.4)
WBC: 9 10*3/uL (ref 3.4–10.8)

## 2021-02-17 LAB — GLUCOSE TOLERANCE, 2 HOURS W/ 1HR
Glucose, 1 hour: 213 mg/dL — ABNORMAL HIGH (ref 65–179)
Glucose, 2 hour: 173 mg/dL — ABNORMAL HIGH (ref 65–152)
Glucose, Fasting: 100 mg/dL — ABNORMAL HIGH (ref 65–91)

## 2021-02-17 LAB — HIV ANTIBODY (ROUTINE TESTING W REFLEX): HIV Screen 4th Generation wRfx: NONREACTIVE

## 2021-02-17 LAB — RPR: RPR Ser Ql: NONREACTIVE

## 2021-02-17 MED ORDER — GLUCOSE BLOOD VI STRP: ORAL_STRIP | 12 refills | Status: DC

## 2021-02-17 MED ORDER — ACCU-CHEK GUIDE W/DEVICE KIT: PACK | 0 refills | Status: DC

## 2021-02-17 MED ORDER — ACCU-CHEK SOFTCLIX LANCETS MISC: 12 refills | Status: DC

## 2021-02-17 NOTE — Telephone Encounter (Signed)
Called pt to discuss results. Pt made aware that she has GDM and will need to be referred to Diabetes Education. Testing supplies were sent to her pharmacy.  Michele Armstrong l Baltazar Pekala, CMA

## 2021-02-23 ENCOUNTER — Encounter: Payer: Self-pay | Admitting: *Deleted

## 2021-02-23 ENCOUNTER — Ambulatory Visit: Payer: Medicaid Other | Admitting: Registered"

## 2021-02-23 ENCOUNTER — Other Ambulatory Visit: Payer: Self-pay

## 2021-02-23 ENCOUNTER — Ambulatory Visit (HOSPITAL_BASED_OUTPATIENT_CLINIC_OR_DEPARTMENT_OTHER): Payer: Medicaid Other

## 2021-02-23 ENCOUNTER — Ambulatory Visit: Payer: Medicaid Other | Admitting: *Deleted

## 2021-02-23 ENCOUNTER — Encounter: Payer: Medicaid Other | Attending: Family Medicine | Admitting: Registered"

## 2021-02-23 DIAGNOSIS — Z362 Encounter for other antenatal screening follow-up: Secondary | ICD-10-CM | POA: Diagnosis not present

## 2021-02-23 DIAGNOSIS — E039 Hypothyroidism, unspecified: Secondary | ICD-10-CM

## 2021-02-23 DIAGNOSIS — Z3480 Encounter for supervision of other normal pregnancy, unspecified trimester: Secondary | ICD-10-CM

## 2021-02-23 DIAGNOSIS — O09522 Supervision of elderly multigravida, second trimester: Secondary | ICD-10-CM | POA: Diagnosis not present

## 2021-02-23 DIAGNOSIS — O99212 Obesity complicating pregnancy, second trimester: Secondary | ICD-10-CM

## 2021-02-23 DIAGNOSIS — O99280 Endocrine, nutritional and metabolic diseases complicating pregnancy, unspecified trimester: Secondary | ICD-10-CM | POA: Diagnosis present

## 2021-02-23 DIAGNOSIS — Z3A27 27 weeks gestation of pregnancy: Secondary | ICD-10-CM

## 2021-02-23 DIAGNOSIS — O24419 Gestational diabetes mellitus in pregnancy, unspecified control: Secondary | ICD-10-CM

## 2021-02-23 DIAGNOSIS — O99282 Endocrine, nutritional and metabolic diseases complicating pregnancy, second trimester: Secondary | ICD-10-CM | POA: Diagnosis not present

## 2021-02-23 DIAGNOSIS — E669 Obesity, unspecified: Secondary | ICD-10-CM

## 2021-02-23 NOTE — Progress Notes (Signed)
Patient was seen on 02/23/21 for Gestational Diabetes self-management. EDD 05/20/21; [redacted]w[redacted]d; . Patient states no history of GDM. Diet history obtained. Patient eats variety of all food groups. Beverages include water, small amount of orange juice.    The following learning objectives were met by the patient :   States the definition of Gestational Diabetes  States why dietary management is important in controlling blood glucose  Describes the effects of carbohydrates on blood glucose levels  Demonstrates ability to create a balanced meal plan  Demonstrates carbohydrate counting   States when to check blood glucose levels  Demonstrates proper blood glucose monitoring techniques  States the effect of stress and exercise on blood glucose levels  States the importance of limiting caffeine and abstaining from alcohol and smoking  Plan:  Aim for 3 Carbohydrate Choices per meal (45 grams) +/- 1 either way  Aim for 1-2 Carbohydrate Choices per snack Begin reading food labels for Total Carbohydrate of foods If OK with your MD, consider  increasing your activity level by walking, Arm Chair Exercises or other activity daily as tolerated Begin checking Blood Glucose before breakfast and 2 hours after first bite of breakfast, lunch and dinner as directed by MD  Bring Log Book/Sheet and meter to every medical appointment  Take medication if directed by MD  Blood glucose monitor given: Patient has meter and brought to visit to learn how to use.   Patient instructed to monitor glucose levels: FBS: 60 - 95 mg/dl 2 hour: <120 mg/dl  Patient received the following handouts:  Nutrition Diabetes and Pregnancy  Carbohydrate Counting List  Blood glucose Log Sheet  Patient will be seen for follow-up as needed.

## 2021-02-24 ENCOUNTER — Other Ambulatory Visit: Payer: Self-pay | Admitting: Obstetrics

## 2021-02-24 DIAGNOSIS — O24420 Gestational diabetes mellitus in childbirth, diet controlled: Secondary | ICD-10-CM

## 2021-02-25 ENCOUNTER — Other Ambulatory Visit: Payer: Self-pay

## 2021-03-02 ENCOUNTER — Encounter: Payer: Self-pay | Admitting: Advanced Practice Midwife

## 2021-03-02 ENCOUNTER — Other Ambulatory Visit: Payer: Self-pay

## 2021-03-02 ENCOUNTER — Ambulatory Visit (INDEPENDENT_AMBULATORY_CARE_PROVIDER_SITE_OTHER): Payer: Medicaid Other | Admitting: Advanced Practice Midwife

## 2021-03-02 VITALS — BP 118/80 | HR 103 | Wt 228.0 lb

## 2021-03-02 DIAGNOSIS — Z3A28 28 weeks gestation of pregnancy: Secondary | ICD-10-CM

## 2021-03-02 DIAGNOSIS — O219 Vomiting of pregnancy, unspecified: Secondary | ICD-10-CM

## 2021-03-02 DIAGNOSIS — Z3480 Encounter for supervision of other normal pregnancy, unspecified trimester: Secondary | ICD-10-CM

## 2021-03-02 DIAGNOSIS — O24419 Gestational diabetes mellitus in pregnancy, unspecified control: Secondary | ICD-10-CM

## 2021-03-02 LAB — POCT URINALYSIS DIPSTICK OB
Bilirubin, UA: NEGATIVE
Blood, UA: NEGATIVE
Glucose, UA: NEGATIVE
Ketones, UA: NEGATIVE
Leukocytes, UA: NEGATIVE
Nitrite, UA: NEGATIVE
POC,PROTEIN,UA: NEGATIVE
Spec Grav, UA: 1.01 (ref 1.010–1.025)
Urobilinogen, UA: 0.2 E.U./dL
pH, UA: 7.5 (ref 5.0–8.0)

## 2021-03-02 MED ORDER — METFORMIN HCL 500 MG PO TABS: 500.0000 mg | ORAL_TABLET | Freq: Every day | ORAL | 3 refills | Status: DC

## 2021-03-02 MED ORDER — VITAMIN B-6 50 MG PO TABS: 50.0000 mg | ORAL_TABLET | Freq: Two times a day (BID) | ORAL | 1 refills | Status: DC

## 2021-03-02 MED ORDER — OMEPRAZOLE MAGNESIUM 20 MG PO TBEC
20.0000 mg | DELAYED_RELEASE_TABLET | Freq: Every day | ORAL | 3 refills | Status: DC
Start: 2021-03-02 — End: 2021-06-10

## 2021-03-02 NOTE — Patient Instructions (Signed)
Gestational Diabetes Mellitus, Diagnosis Gestational diabetes mellitus is a form of diabetes. It can happen when you are pregnant. The diabetes goes away after you give birth. If you do not get treated for this condition, it may cause problems for you or your baby. What are the causes? This condition is caused by changes in your body when you are pregnant. When these happen:  A part of the body called the pancreas does not make enough insulin.  The body cannot use insulin in the right way. Sugars cannot get into cells in your body. The sugars stay in your blood. This leads to high blood sugar.   What increases the risk?  Being older than age 15 when pregnant.  Having someone with diabetes in your family.  Too much body weight.  Having had this condition in the past.  Polycystic ovary syndrome.  Being pregnant with more than one baby. What are the signs or symptoms?  Being thirsty often.  Being hungry often.  Needing to pee more often. How is this treated?  Eat a healthy diet.  Get more exercise.  Check your blood sugar often.  Take insulin and other medicines, if needed.  Work with an Cytogeneticist on this condition, if told. Follow these instructions at home: Learn about your diabetes Ask your doctor:  How often should I check my blood sugar? Where do I get the equipment?  What medicines do I need? When should I take them?  Do I need to meet with an educator?  Who can I call if I have questions?  Where can I find a support group? General instructions  Take medicines only as told by your doctor.  Stay at a healthy weight.  Drink enough fluid to keep your pee pale yellow.  Wear an alert bracelet or carry a card that shows you have this condition.  Keep all follow-up visits. Where to find more information  American Diabetes Association (ADA): diabetes.org  Association of Diabetes Care & Education Specialists (ADCES): diabeteseducator.org  Centers for  Disease Control and Prevention (CDC): StoreMirror.com.cy  American Pregnancy Association: americanpregnancy.org  U.S. Department of Agriculture MyPlate: http://www.wilson-mendoza.org/ Contact a doctor if:  Your blood sugar is at or above 240 mg/dL (13.3 mmol/L).  Your blood sugar is at or above 200 mg/dL (11.1 mmol/L) and you have ketones in your pee.  You have a fever.  You are sick for 2 days or more and you do not get better.  You have either of these problems for more than 6 hours: ? You vomit every time you eat or drink. ? You have watery poop (diarrhea). Get help right away if:  You cannot think clearly.  You are not breathing well.  You have a lot of ketones in your pee.  Your baby seems to move less than normal.  Abnormal fluid or blood starts to come out of your vagina.  You start having contractions before your due date. You may feel your belly tighten.  You have a very bad headache. These symptoms may be an emergency. Get help right away. Call your local emergency services (911 in the [place]  Do not wait to see if the symptoms will go away.  Do not drive yourself to the hospital. Summary  Gestational diabetes is a form of diabetes. It can happen when you are pregnant.  This condition occurs when your body cannot make or use insulin in the right way.  Eat a healthy diet, exercise, and use medicines or insulin  as told by your doctor.  Tell your doctor if your blood sugar is high, you have a fever, or you vomit every time you eat or drink.  Get help right away if you cannot think clearly, you are not breathing well, or your baby seems to move less than normal. This information is not intended to replace advice given to you by your health care provider. Make sure you discuss any questions you have with your health care provider. Document Revised: 02/24/2020 Document Reviewed: 02/24/2020 Elsevier Patient Education  [place].

## 2021-03-02 NOTE — Progress Notes (Signed)
   PRENATAL VISIT NOTE  Subjective:  Michele Armstrong is a 35 y.o. G3P1011 at [redacted]w[redacted]d being seen today for ongoing prenatal care.  She is currently monitored for the following issues for this high-risk pregnancy and has PTSD (post-traumatic stress disorder); Depression, major, recurrent ([place]); Hypothyroid; Prolapsed internal hemorrhoids, grade 2; Latex allergy; Stress incontinence, female; Supervision of other normal pregnancy, antepartum; Obesity affecting pregnancy, antepartum; Adjustment disorder with mixed anxiety and depressed mood; Bipolar 1 disorder ([place]); Chronic back pain; Chronic pain of both knees; Elevated LFTs; Gastroesophageal reflux disease; Hemorrhoids; Insomnia; Large breasts; Nevus of right shoulder; Obesity, Class III, BMI 40-49.9 (morbid obesity) ([place]); Pain aggravated by breast feeding; and Gestational diabetes on their problem list.  Patient reports no complaints.  Contractions: Not present. Vag. Bleeding: None.  Movement: Present. Denies leaking of fluid.   The following portions of the patient's history were reviewed and updated as appropriate: allergies, current medications, past family history, past medical history, past social history, past surgical history and problem list.   Objective:   Vitals:   03/02/21 0948  BP: 118/80  Pulse: (!) 103  Weight: 228 lb (103.4 kg)    Fetal Status: Fetal Heart Rate (bpm): 146   Movement: Present     General:  Alert, oriented and cooperative. Patient is in no acute distress.  Skin: Skin is warm and dry. No rash noted.   Cardiovascular: Normal heart rate noted  Respiratory: Normal respiratory effort, no problems with respiration noted  Abdomen: Soft, gravid, appropriate for gestational age.  Pain/Pressure: Present     Pelvic: Cervical exam deferred        Extremities: Normal range of motion.  Edema: None  Mental Status: Normal mood and affect. Normal behavior. Normal judgment and thought content.   Assessment and Plan:   Pregnancy: G3P1011 at [redacted]w[redacted]d 1. [redacted] weeks gestation of pregnancy      Still vomiting. Out of her omeprazole, refilled      Will add Vit B6 bid  2. Gestational diabetes mellitus (GDM), antepartum, gestational diabetes method of control unspecified      2 hr PC sugars have been good (only one elevated, but she had dessert), but all FBSs are in low 100s-110.  D/W Dr Rosana Hoes.  Wills start Metformin 500mg  qHS - POC Urinalysis Dipstick OB  3. Supervision of other normal pregnancy, antepartum   Preterm labor symptoms and general obstetric precautions including but not limited to vaginal bleeding, contractions, leaking of fluid and fetal movement were reviewed in detail with the patient. Please refer to After Visit Summary for other counseling recommendations.   Return in about 2 weeks (around 03/16/2021) for [facility].  Future Appointments  Date Time Provider [place]  03/17/2021 11:00 AM Truett Mainland, DO [facility] None  03/23/2021  3:00 PM [facility] NURSE [facility]  03/23/2021  3:15 PM [facility] US2 [facility]  04/02/2021 10:00 AM Truett Mainland, DO [facility] None  04/15/2021  1:00 PM Truett Mainland, DO [facility] None  04/29/2021 10:00 AM Anyanwu, Sallyanne Havers, MD [facility] None    Hansel Feinstein, CNM

## 2021-03-17 ENCOUNTER — Ambulatory Visit (INDEPENDENT_AMBULATORY_CARE_PROVIDER_SITE_OTHER): Payer: Medicaid Other | Admitting: Family Medicine

## 2021-03-17 ENCOUNTER — Other Ambulatory Visit: Payer: Self-pay

## 2021-03-17 VITALS — BP 102/69 | HR 86 | Wt 228.0 lb

## 2021-03-17 DIAGNOSIS — K219 Gastro-esophageal reflux disease without esophagitis: Secondary | ICD-10-CM

## 2021-03-17 DIAGNOSIS — F319 Bipolar disorder, unspecified: Secondary | ICD-10-CM

## 2021-03-17 DIAGNOSIS — Z3A30 30 weeks gestation of pregnancy: Secondary | ICD-10-CM

## 2021-03-17 DIAGNOSIS — E038 Other specified hypothyroidism: Secondary | ICD-10-CM

## 2021-03-17 DIAGNOSIS — Z3480 Encounter for supervision of other normal pregnancy, unspecified trimester: Secondary | ICD-10-CM

## 2021-03-17 DIAGNOSIS — O24419 Gestational diabetes mellitus in pregnancy, unspecified control: Secondary | ICD-10-CM

## 2021-03-17 DIAGNOSIS — R7989 Other specified abnormal findings of blood chemistry: Secondary | ICD-10-CM

## 2021-03-17 NOTE — Progress Notes (Signed)
+   Fetal movement. Pt has blood sugar logs to review.

## 2021-03-17 NOTE — Progress Notes (Signed)
   PRENATAL VISIT NOTE  Subjective:  Michele Armstrong is a 35 y.o. G3P1011 at [redacted]w[redacted]d being seen today for ongoing prenatal care.  She is currently monitored for the following issues for this high-risk pregnancy and has PTSD (post-traumatic stress disorder); Depression, major, recurrent ([place]); Hypothyroid; Prolapsed internal hemorrhoids, grade 2; Latex allergy; Stress incontinence, female; Supervision of other normal pregnancy, antepartum; Obesity affecting pregnancy, antepartum; Adjustment disorder with mixed anxiety and depressed mood; Bipolar 1 disorder ([place]); Chronic back pain; Chronic pain of both knees; Elevated LFTs; Gastroesophageal reflux disease; Hemorrhoids; Insomnia; Large breasts; Nevus of right shoulder; Obesity, Class III, BMI 40-49.9 (morbid obesity) ([place]); Pain aggravated by breast feeding; and Gestational diabetes on their problem list.  Patient reports no complaints.  Contractions: Irritability. Vag. Bleeding: None.  Movement: Present. Denies leaking of fluid.   The following portions of the patient's history were reviewed and updated as appropriate: allergies, current medications, past family history, past medical history, past social history, past surgical history and problem list.   Objective:   Vitals:   03/17/21 1102  BP: 102/69  Pulse: 86  Weight: 228 lb (103.4 kg)    Fetal Status: Fetal Heart Rate (bpm): 144   Movement: Present     General:  Alert, oriented and cooperative. Patient is in no acute distress.  Skin: Skin is warm and dry. No rash noted.   Cardiovascular: Normal heart rate noted  Respiratory: Normal respiratory effort, no problems with respiration noted  Abdomen: Soft, gravid, appropriate for gestational age.  Pain/Pressure: Present     Pelvic: Cervical exam deferred        Extremities: Normal range of motion.  Edema: Trace  Mental Status: Normal mood and affect. Normal behavior. Normal judgment and thought content.   Assessment and Plan:  Pregnancy:  G3P1011 at [redacted]w[redacted]d 1. [redacted] weeks gestation of pregnancy  2. Supervision of other normal pregnancy, antepartum FHT and FH normal  3. Gestational diabetes mellitus (GDM), antepartum, gestational diabetes method of control unspecified Increase metformin to 1000mg  in evening. Has been taking at bedtime, will have her change to with dinner.  [place] at 36 weeks Antenatal testing at 32 weeks.  4. Bipolar 1 disorder ([place]) Went off Cymbalta  5. Obesity, Class III, BMI 40-49.9 (morbid obesity) ([place])   6. Gastroesophageal reflux disease without esophagitis Prilosec  7. Elevated LFTs Normal last check  8. Other specified hypothyroidism On synthroid Check TSH next visit  Preterm labor symptoms and general obstetric precautions including but not limited to vaginal bleeding, contractions, leaking of fluid and fetal movement were reviewed in detail with the patient. Please refer to After Visit Summary for other counseling recommendations.   No follow-ups on file.  Future Appointments  Date Time Provider [place]  03/23/2021  3:00 PM [facility] NURSE [facility]  03/23/2021  3:15 PM [facility] US2 [facility]  04/02/2021 10:00 AM Truett Mainland, DO [facility] None  04/15/2021  3:30 PM Truett Mainland, DO [facility] None  04/29/2021 10:00 AM Anyanwu, Sallyanne Havers, MD [facility] None    Truett Mainland, DO

## 2021-03-23 ENCOUNTER — Ambulatory Visit: Payer: Medicaid Other | Admitting: *Deleted

## 2021-03-23 ENCOUNTER — Ambulatory Visit: Payer: Medicaid Other | Attending: Obstetrics

## 2021-03-23 ENCOUNTER — Other Ambulatory Visit: Payer: Self-pay | Admitting: *Deleted

## 2021-03-23 ENCOUNTER — Other Ambulatory Visit: Payer: Self-pay

## 2021-03-23 ENCOUNTER — Encounter: Payer: Self-pay | Admitting: *Deleted

## 2021-03-23 VITALS — BP 119/68 | HR 98

## 2021-03-23 DIAGNOSIS — Z362 Encounter for other antenatal screening follow-up: Secondary | ICD-10-CM

## 2021-03-23 DIAGNOSIS — O24420 Gestational diabetes mellitus in childbirth, diet controlled: Secondary | ICD-10-CM | POA: Diagnosis not present

## 2021-03-23 DIAGNOSIS — E669 Obesity, unspecified: Secondary | ICD-10-CM

## 2021-03-23 DIAGNOSIS — Z3480 Encounter for supervision of other normal pregnancy, unspecified trimester: Secondary | ICD-10-CM | POA: Diagnosis present

## 2021-03-23 DIAGNOSIS — O09523 Supervision of elderly multigravida, third trimester: Secondary | ICD-10-CM

## 2021-03-23 DIAGNOSIS — O321XX0 Maternal care for breech presentation, not applicable or unspecified: Secondary | ICD-10-CM

## 2021-03-23 DIAGNOSIS — O99283 Endocrine, nutritional and metabolic diseases complicating pregnancy, third trimester: Secondary | ICD-10-CM

## 2021-03-23 DIAGNOSIS — Z3A31 31 weeks gestation of pregnancy: Secondary | ICD-10-CM

## 2021-03-23 DIAGNOSIS — O99213 Obesity complicating pregnancy, third trimester: Secondary | ICD-10-CM

## 2021-03-23 DIAGNOSIS — O24415 Gestational diabetes mellitus in pregnancy, controlled by oral hypoglycemic drugs: Secondary | ICD-10-CM

## 2021-03-23 DIAGNOSIS — E039 Hypothyroidism, unspecified: Secondary | ICD-10-CM

## 2021-03-23 DIAGNOSIS — O24410 Gestational diabetes mellitus in pregnancy, diet controlled: Secondary | ICD-10-CM | POA: Diagnosis not present

## 2021-03-30 ENCOUNTER — Ambulatory Visit (INDEPENDENT_AMBULATORY_CARE_PROVIDER_SITE_OTHER): Payer: Medicaid Other

## 2021-03-30 ENCOUNTER — Ambulatory Visit: Payer: Medicaid Other | Admitting: *Deleted

## 2021-03-30 ENCOUNTER — Other Ambulatory Visit: Payer: Self-pay

## 2021-03-30 VITALS — BP 118/73 | HR 97 | Wt 232.0 lb

## 2021-03-30 DIAGNOSIS — Z3A32 32 weeks gestation of pregnancy: Secondary | ICD-10-CM | POA: Diagnosis not present

## 2021-03-30 DIAGNOSIS — O24415 Gestational diabetes mellitus in pregnancy, controlled by oral hypoglycemic drugs: Secondary | ICD-10-CM | POA: Diagnosis not present

## 2021-03-30 NOTE — Progress Notes (Signed)

## 2021-04-02 ENCOUNTER — Inpatient Hospital Stay (HOSPITAL_COMMUNITY): Payer: Medicaid Other

## 2021-04-02 ENCOUNTER — Observation Stay (HOSPITAL_COMMUNITY)
Admission: AD | Admit: 2021-04-02 | Discharge: 2021-04-03 | Disposition: A | Discharge status: Home / Self Care | Payer: Medicaid Other | Attending: Family Medicine | Admitting: Family Medicine

## 2021-04-02 ENCOUNTER — Ambulatory Visit (INDEPENDENT_AMBULATORY_CARE_PROVIDER_SITE_OTHER): Payer: Medicaid Other | Admitting: Family Medicine

## 2021-04-02 ENCOUNTER — Encounter (HOSPITAL_BASED_OUTPATIENT_CLINIC_OR_DEPARTMENT_OTHER): Payer: Self-pay

## 2021-04-02 ENCOUNTER — Other Ambulatory Visit: Payer: Self-pay

## 2021-04-02 ENCOUNTER — Emergency Department (HOSPITAL_BASED_OUTPATIENT_CLINIC_OR_DEPARTMENT_OTHER)
Admission: EM | Admit: 2021-04-02 | Discharge: 2021-04-02 | Disposition: A | Discharge status: Other Acute Inpatient Hospital | Payer: Medicaid Other | Source: Home / Self Care | Attending: Emergency Medicine | Admitting: Emergency Medicine

## 2021-04-02 ENCOUNTER — Encounter (HOSPITAL_COMMUNITY): Payer: Self-pay | Admitting: Family Medicine

## 2021-04-02 VITALS — BP 109/85 | HR 120 | Wt 229.0 lb

## 2021-04-02 DIAGNOSIS — Z87891 Personal history of nicotine dependence: Secondary | ICD-10-CM | POA: Diagnosis not present

## 2021-04-02 DIAGNOSIS — R748 Abnormal levels of other serum enzymes: Secondary | ICD-10-CM | POA: Insufficient documentation

## 2021-04-02 DIAGNOSIS — Z79899 Other long term (current) drug therapy: Secondary | ICD-10-CM | POA: Diagnosis not present

## 2021-04-02 DIAGNOSIS — Z20822 Contact with and (suspected) exposure to covid-19: Secondary | ICD-10-CM | POA: Insufficient documentation

## 2021-04-02 DIAGNOSIS — Z7984 Long term (current) use of oral hypoglycemic drugs: Secondary | ICD-10-CM | POA: Insufficient documentation

## 2021-04-02 DIAGNOSIS — O99613 Diseases of the digestive system complicating pregnancy, third trimester: Principal | ICD-10-CM | POA: Insufficient documentation

## 2021-04-02 DIAGNOSIS — K859 Acute pancreatitis without necrosis or infection, unspecified: Secondary | ICD-10-CM | POA: Diagnosis not present

## 2021-04-02 DIAGNOSIS — E039 Hypothyroidism, unspecified: Secondary | ICD-10-CM | POA: Insufficient documentation

## 2021-04-02 DIAGNOSIS — M545 Low back pain, unspecified: Secondary | ICD-10-CM | POA: Insufficient documentation

## 2021-04-02 DIAGNOSIS — E038 Other specified hypothyroidism: Secondary | ICD-10-CM

## 2021-04-02 DIAGNOSIS — Z3480 Encounter for supervision of other normal pregnancy, unspecified trimester: Secondary | ICD-10-CM

## 2021-04-02 DIAGNOSIS — Z3A33 33 weeks gestation of pregnancy: Secondary | ICD-10-CM

## 2021-04-02 DIAGNOSIS — K219 Gastro-esophageal reflux disease without esophagitis: Secondary | ICD-10-CM

## 2021-04-02 DIAGNOSIS — O219 Vomiting of pregnancy, unspecified: Secondary | ICD-10-CM | POA: Diagnosis present

## 2021-04-02 DIAGNOSIS — O99283 Endocrine, nutritional and metabolic diseases complicating pregnancy, third trimester: Secondary | ICD-10-CM | POA: Diagnosis not present

## 2021-04-02 DIAGNOSIS — R1030 Lower abdominal pain, unspecified: Secondary | ICD-10-CM | POA: Insufficient documentation

## 2021-04-02 DIAGNOSIS — Z91040 Latex allergy status: Secondary | ICD-10-CM | POA: Insufficient documentation

## 2021-04-02 DIAGNOSIS — O24415 Gestational diabetes mellitus in pregnancy, controlled by oral hypoglycemic drugs: Secondary | ICD-10-CM | POA: Diagnosis not present

## 2021-04-02 DIAGNOSIS — O24419 Gestational diabetes mellitus in pregnancy, unspecified control: Secondary | ICD-10-CM | POA: Insufficient documentation

## 2021-04-02 DIAGNOSIS — O212 Late vomiting of pregnancy: Secondary | ICD-10-CM | POA: Insufficient documentation

## 2021-04-02 DIAGNOSIS — R112 Nausea with vomiting, unspecified: Secondary | ICD-10-CM

## 2021-04-02 DIAGNOSIS — F319 Bipolar disorder, unspecified: Secondary | ICD-10-CM

## 2021-04-02 DIAGNOSIS — Z3493 Encounter for supervision of normal pregnancy, unspecified, third trimester: Secondary | ICD-10-CM

## 2021-04-02 HISTORY — DX: Gestational diabetes mellitus in pregnancy, unspecified control: O24.419

## 2021-04-02 LAB — COMPREHENSIVE METABOLIC PANEL
ALT: 16 U/L (ref 0–44)
AST: 15 U/L (ref 15–41)
Albumin: 2.8 g/dL — ABNORMAL LOW (ref 3.5–5.0)
Alkaline Phosphatase: 97 U/L (ref 38–126)
Anion gap: 8 (ref 5–15)
BUN: 8 mg/dL (ref 6–20)
CO2: 21 mmol/L — ABNORMAL LOW (ref 22–32)
Calcium: 8.3 mg/dL — ABNORMAL LOW (ref 8.9–10.3)
Chloride: 108 mmol/L (ref 98–111)
Creatinine, Ser: 0.49 mg/dL (ref 0.44–1.00)
GFR, Estimated: >60 mL/min (ref >60)
Glucose, Bld: 112 mg/dL — ABNORMAL HIGH (ref 70–99)
Potassium: 3.6 mmol/L (ref 3.5–5.1)
Sodium: 137 mmol/L (ref 135–145)
Total Bilirubin: 0.3 mg/dL (ref 0.3–1.2)
Total Protein: 6.1 g/dL — ABNORMAL LOW (ref 6.5–8.1)

## 2021-04-02 LAB — CBC WITH DIFFERENTIAL/PLATELET
Abs Immature Granulocytes: 0.08 10*3/uL — ABNORMAL HIGH (ref 0.00–0.07)
Basophils Absolute: 0 10*3/uL (ref 0.0–0.1)
Basophils Relative: 0 %
Eosinophils Absolute: 0 10*3/uL (ref 0.0–0.5)
Eosinophils Relative: 0 %
HCT: 36.7 % (ref 36.0–46.0)
Hemoglobin: 12.1 g/dL (ref 12.0–15.0)
Immature Granulocytes: 1 %
Lymphocytes Relative: 7 %
Lymphs Abs: 1 10*3/uL (ref 0.7–4.0)
MCH: 29.2 pg (ref 26.0–34.0)
MCHC: 33 g/dL (ref 30.0–36.0)
MCV: 88.6 fL (ref 80.0–100.0)
Monocytes Absolute: 0.8 10*3/uL (ref 0.1–1.0)
Monocytes Relative: 5 %
Neutro Abs: 13.7 10*3/uL — ABNORMAL HIGH (ref 1.7–7.7)
Neutrophils Relative %: 87 %
Platelets: 280 10*3/uL (ref 150–400)
RBC: 4.14 MIL/uL (ref 3.87–5.11)
RDW: 13.8 % (ref 11.5–15.5)
WBC: 15.7 10*3/uL — ABNORMAL HIGH (ref 4.0–10.5)
nRBC: 0 % (ref 0.0–0.2)

## 2021-04-02 LAB — CBC
HCT: 33.8 % — ABNORMAL LOW (ref 36.0–46.0)
Hemoglobin: 11.2 g/dL — ABNORMAL LOW (ref 12.0–15.0)
MCH: 29.4 pg (ref 26.0–34.0)
MCHC: 33.1 g/dL (ref 30.0–36.0)
MCV: 88.7 fL (ref 80.0–100.0)
Platelets: 247 10*3/uL (ref 150–400)
RBC: 3.81 MIL/uL — ABNORMAL LOW (ref 3.87–5.11)
RDW: 13.9 % (ref 11.5–15.5)
WBC: 10.7 10*3/uL — ABNORMAL HIGH (ref 4.0–10.5)
nRBC: 0 % (ref 0.0–0.2)

## 2021-04-02 LAB — GLUCOSE, CAPILLARY
Glucose-Capillary: 136 mg/dL — ABNORMAL HIGH (ref 70–99)
Glucose-Capillary: 82 mg/dL (ref 70–99)

## 2021-04-02 LAB — MAGNESIUM: Magnesium: 1.6 mg/dL — ABNORMAL LOW (ref 1.7–2.4)

## 2021-04-02 LAB — CREATININE, SERUM
Creatinine, Ser: 0.51 mg/dL (ref 0.44–1.00)
GFR, Estimated: >60 mL/min (ref >60)

## 2021-04-02 LAB — RESP PANEL BY RT-PCR (FLU A&B, COVID) ARPGX2
Influenza A by PCR: NEGATIVE
Influenza B by PCR: NEGATIVE
SARS Coronavirus 2 by RT PCR: NEGATIVE

## 2021-04-02 LAB — LACTIC ACID, PLASMA: Lactic Acid, Venous: 1.2 mmol/L (ref 0.5–1.9)

## 2021-04-02 LAB — LIPASE, BLOOD: Lipase: 118 U/L — ABNORMAL HIGH (ref 11–51)

## 2021-04-02 LAB — PHOSPHORUS: Phosphorus: 3.3 mg/dL (ref 2.5–4.6)

## 2021-04-02 MED ORDER — ONDANSETRON HCL 4 MG/2ML IJ SOLN
4.0000 mg | Freq: Four times a day (QID) | INTRAMUSCULAR | Status: DC | PRN
  Administered 2021-04-02: 4 mg via INTRAVENOUS
  Filled 2021-04-02: qty 2

## 2021-04-02 MED ORDER — METOCLOPRAMIDE HCL 5 MG/ML IJ SOLN
10.0000 mg | Freq: Once | INTRAMUSCULAR | Status: AC
  Administered 2021-04-02: 10 mg via INTRAVENOUS
  Filled 2021-04-02: qty 2

## 2021-04-02 MED ORDER — LEVOTHYROXINE SODIUM 25 MCG PO TABS
100.0000 ug | ORAL_TABLET | Freq: Every day | ORAL | Status: DC
  Administered 2021-04-03: 100 ug via ORAL
  Filled 2021-04-02: qty 4

## 2021-04-02 MED ORDER — ENOXAPARIN SODIUM 60 MG/0.6ML IJ SOSY
50.0000 mg | PREFILLED_SYRINGE | INTRAMUSCULAR | Status: DC
  Administered 2021-04-02: 50 mg via SUBCUTANEOUS
  Filled 2021-04-02: qty 0.6

## 2021-04-02 MED ORDER — METFORMIN HCL 500 MG PO TABS
500.0000 mg | ORAL_TABLET | Freq: Every day | ORAL | Status: DC
  Administered 2021-04-02: 500 mg via ORAL
  Filled 2021-04-02: qty 1

## 2021-04-02 MED ORDER — ZOLPIDEM TARTRATE 5 MG PO TABS
5.0000 mg | ORAL_TABLET | Freq: Every evening | ORAL | Status: DC | PRN
  Administered 2021-04-02: 5 mg via ORAL
  Filled 2021-04-02: qty 1

## 2021-04-02 MED ORDER — ACETAMINOPHEN 325 MG PO TABS
650.0000 mg | ORAL_TABLET | ORAL | Status: DC | PRN
  Administered 2021-04-03: 650 mg via ORAL
  Filled 2021-04-02: qty 2

## 2021-04-02 MED ORDER — LACTATED RINGERS IV SOLN: INTRAVENOUS | Status: DC

## 2021-04-02 MED ORDER — DOCUSATE SODIUM 100 MG PO CAPS
100.0000 mg | ORAL_CAPSULE | Freq: Every day | ORAL | Status: DC
  Administered 2021-04-03: 100 mg via ORAL
  Filled 2021-04-02 (×2): qty 1

## 2021-04-02 MED ORDER — SCOPOLAMINE 1 MG/3DAYS TD PT72
1.0000 | MEDICATED_PATCH | TRANSDERMAL | Status: DC
  Administered 2021-04-02: 1.5 mg via TRANSDERMAL
  Filled 2021-04-02: qty 1

## 2021-04-02 MED ORDER — INSULIN ASPART 100 UNIT/ML IJ SOLN: 0.0000 [IU] | Freq: Three times a day (TID) | INTRAMUSCULAR | Status: DC

## 2021-04-02 MED ORDER — DIPHENHYDRAMINE HCL 50 MG/ML IJ SOLN
25.0000 mg | Freq: Once | INTRAMUSCULAR | Status: AC
  Administered 2021-04-02: 25 mg via INTRAVENOUS
  Filled 2021-04-02: qty 1

## 2021-04-02 MED ORDER — LACTATED RINGERS IV BOLUS
2000.0000 mL | Freq: Once | INTRAVENOUS | Status: AC
  Administered 2021-04-02: 2000 mL via INTRAVENOUS

## 2021-04-02 MED ORDER — CALCIUM CARBONATE ANTACID 500 MG PO CHEW: 2.0000 | CHEWABLE_TABLET | ORAL | Status: DC | PRN

## 2021-04-02 MED ORDER — PRENATAL MULTIVITAMIN CH
1.0000 | ORAL_TABLET | Freq: Every day | ORAL | Status: DC
  Administered 2021-04-03: 1 via ORAL
  Filled 2021-04-02: qty 1

## 2021-04-02 MED ORDER — PANTOPRAZOLE SODIUM 40 MG IV SOLR
40.0000 mg | INTRAVENOUS | Status: DC
  Administered 2021-04-02: 40 mg via INTRAVENOUS
  Filled 2021-04-02: qty 40

## 2021-04-02 MED ORDER — MAGNESIUM SULFATE IN D5W 1-5 GM/100ML-% IV SOLN
1.0000 g | Freq: Once | INTRAVENOUS | Status: AC
  Administered 2021-04-02: 1 g via INTRAVENOUS
  Filled 2021-04-02: qty 100

## 2021-04-02 MED ORDER — FAMOTIDINE IN NACL 20-0.9 MG/50ML-% IV SOLN
20.0000 mg | Freq: Once | INTRAVENOUS | Status: AC
  Administered 2021-04-02: 20 mg via INTRAVENOUS
  Filled 2021-04-02: qty 50

## 2021-04-02 NOTE — Progress Notes (Signed)
[facility] called [facility] nurse saying that pt reported to them via Dr Glenna Durand office complaining of nausea, vomiting, and abdominal cramping that does not feel like UCs.  FHR monitor applied.  Pt reports good fetal movement, no LOF, and no VB.  Dr Kennon Rounds made aware and orders NST.  MD says that pt can be OB cleared after that time if fhr is reactive.

## 2021-04-02 NOTE — Progress Notes (Signed)
+   Fetal movement. Pt has had nausea and vomiting since last night. Increases with heat. States A/C went out at home.

## 2021-04-02 NOTE — Progress Notes (Signed)
Pt changed her mind and decided to have u/s done tonight.

## 2021-04-02 NOTE — Progress Notes (Signed)
U/s/ radiology called at 945pm for pt to come down. Pt had taken ambien to help her rest and stated she wanted it done in the a.m. U/S tech notified

## 2021-04-02 NOTE — H&P (Addendum)
Belle Charlie is an 35 y.o. G77P1011 [redacted]w[redacted]d female.   Chief Complaint: Vomiting HPI: Patient was seen in the office today and noted to have continuous emesis.  She was sent down to the emergency room where work-up revealed an elevated lipase.  She received 2 L of IV fluids Reglan, Benadryl and Pepcid with no relief of her emesis. She was sent over for further evaluation. Patient has a history of cholecystectomy in the past.  Past Medical History:  Diagnosis Date   Anxiety    Arrhythmia    tachycardia   Depression    Fatigue    Gestational diabetes    Headache(784.0)    Hypothyroid    Internal hemorrhoids    PTSD (post-traumatic stress disorder)    Seizures ([place])    Medication related seizure x 1    Past Surgical History:  Procedure Laterality Date   CHOLECYSTECTOMY     FLEXIBLE SIGMOIDOSCOPY N/A 06/01/2017   Procedure: FLEXIBLE SIGMOIDOSCOPY;  Surgeon: Milus Banister, MD;  Location: [facility];  Service: Endoscopy;  Laterality: N/A;   HEMORRHOID BANDING  2018   WISDOM TOOTH EXTRACTION      Family History  Problem Relation Age of Onset   Alcohol abuse Father    Drug abuse Father    Hypertension Father    Hypertension Mother    Diabetes Mother    Leukemia Maternal Grandmother    Breast cancer Maternal Aunt    Colon cancer Neg Hx    Stomach cancer Neg Hx    Social History:  reports that she quit smoking about 2 months ago. Her smoking use included cigarettes. She has never used smokeless tobacco. She reports previous alcohol use. She reports previous drug use. Drug: Marijuana.    Allergies  Allergen Reactions   Morphine And Related Nausea And Vomiting   Latex Rash    Causes UTI when condom used.    Medications Prior to Admission  Medication Sig Dispense Refill   Accu-Chek Softclix Lancets lancets DX: O24:419. Check BS QID 100 each 12   Blood Glucose Monitoring Suppl (ACCU-CHEK GUIDE) w/Device KIT DX: O24:419. Check BS QID 1 kit 0   cetirizine (ZYRTEC) 10 MG  tablet Take by mouth.     glucose blood test strip DX: O24:419. Check BS QID 100 each 12   levothyroxine (SYNTHROID) 100 MCG tablet Take 1 tablet (100 mcg total) by mouth daily before breakfast. 30 tablet 3   metFORMIN (GLUCOPHAGE) 500 MG tablet Take 1 tablet (500 mg total) by mouth at bedtime. 30 tablet 3   omeprazole (PRILOSEC OTC) 20 MG tablet Take 1 tablet (20 mg total) by mouth daily. 30 tablet 3   Prenat-FeAsp-Meth-FA-DHA w/o A (PRENATE PIXIE) 10-0.6-0.4-200 MG CAPS Take 1 capsule by mouth daily.     pyridOXINE (VITAMIN B-6) 50 MG tablet Take 1 tablet (50 mg total) by mouth in the morning and at bedtime. 60 tablet 1     A comprehensive review of systems was negative.  Blood pressure (!) 100/52, pulse 81, temperature 98.3 F (36.8 C), temperature source Oral, resp. rate 18, last menstrual period 08/13/2020, SpO2 100 %, currently breastfeeding. General appearance: alert, cooperative, and appears stated age Head: Normocephalic, without obvious abnormality, atraumatic Neck: supple, symmetrical, trachea midline Lungs:  Normal effort Heart: regular rate and rhythm Abdomen:  Soft, gravid, mild epigastric tenderness Extremities: no edema, redness or tenderness in the calves or thighs Skin: Skin color, texture, turgor normal. No rashes or lesions Neurologic: Grossly normal NST:  Baseline: 120  bpm, Variability: Good {> 6 bpm), Accelerations: Reactive, and Decelerations: Absent   Lab Results  Component Value Date   WBC 10.7 (H) 04/02/2021   HGB 11.2 (L) 04/02/2021   HCT 33.8 (L) 04/02/2021   MCV 88.7 04/02/2021   PLT 247 04/02/2021           ABO, Rh: O/Positive/-- (01/28 0959)  Antibody: Negative (01/28 0959)  Rubella: 1.43 (01/28 0959)  RPR: Non Reactive (05/16 0930)  HBsAg: Negative (01/28 0959)  HIV: Non Reactive (05/16 0930)  GBS:       Prenatal Transfer Tool  Maternal Diabetes: Yes:  Diabetes Type:  Insulin/Medication controlled Genetic Screening: Normal Maternal  Ultrasounds/Referrals: Normal Fetal Ultrasounds or other Referrals:  None Maternal Substance Abuse:  No Significant Maternal Medications:  Meds include: Syntroid Other: Metformin, Zyrtec, Prilosec Significant Maternal Lab Results: None   Assessment/Plan Principal Problem:   Pancreatitis Active Problems:   Hypothyroid   Gestational diabetes   Given elevation of lipase and prior history of cholecystectomy, will check right upper quadrant ultrasound and possible MRI if needed. IV fluid hydration Pain meds as needed Clear liquid diet Continue Synthroid for her thyroid Will continue metformin if not nauseated Substitute Protonix Antiemetics Date Donnamae Jude 04/02/2021, 7:44 PM

## 2021-04-02 NOTE — ED Notes (Signed)
Report given to [facility] Certified Nurse Midwife

## 2021-04-02 NOTE — Progress Notes (Signed)
FHR tracing is reactive and reassuring.  [facility] notified, Pt OB cleared

## 2021-04-02 NOTE — ED Notes (Signed)
Called RR , Industrial/product designer

## 2021-04-02 NOTE — ED Notes (Signed)
Pt placed on Toco monitor, FHR 148

## 2021-04-02 NOTE — ED Notes (Signed)
Erin RN returned call, patient is cleared by OB, no contractions detected.

## 2021-04-02 NOTE — ED Triage Notes (Signed)
Pt c/o n/v started last night-states she is [redacted] weeks pregnant-states she was sent by OB-to triage in w/c

## 2021-04-02 NOTE — Progress Notes (Signed)
Subjective:  Michele Armstrong is a 35 y.o. G3P1011 at [redacted]w[redacted]d being seen today for ongoing prenatal care.  She is currently monitored for the following issues for this high-risk pregnancy and has PTSD (post-traumatic stress disorder); Depression, major, recurrent ([place]); Hypothyroid; Prolapsed internal hemorrhoids, grade 2; Latex allergy; Stress incontinence, female; Supervision of other normal pregnancy, antepartum; Obesity affecting pregnancy, antepartum; Adjustment disorder with mixed anxiety and depressed mood; Bipolar 1 disorder ([place]); Chronic back pain; Chronic pain of both knees; Elevated LFTs; Gastroesophageal reflux disease; Hemorrhoids; Insomnia; Large breasts; Nevus of right shoulder; Obesity, Class III, BMI 40-49.9 (morbid obesity) ([place]); Pain aggravated by breast feeding; and Gestational diabetes on their problem list.  Patient has been vomiting over past couple of days - drinking gingerale, having difficulty taking her metformin.   GDM: Patient taking Metformin 1000mg  with dinner.  Reports no hypoglycemic episodes.  Tolerating medication well Fasting: variable, especially since being sick. 2hr PP:  Patient reports nausea and vomiting.  Contractions: Not present. Vag. Bleeding: None.  Movement: Present. Denies leaking of fluid.   The following portions of the patient's history were reviewed and updated as appropriate: allergies, current medications, past family history, past medical history, past social history, past surgical history and problem list. Problem list updated.  Objective:   Vitals:   04/02/21 1005  BP: 109/85  Pulse: (!) 120  Weight: 229 lb (103.9 kg)    Fetal Status: Fetal Heart Rate (bpm): 143   Movement: Present     General:  Alert, oriented and cooperative. Patient is in no acute distress.  Skin: Skin is warm and dry. No rash noted.   Cardiovascular: Normal heart rate noted  Respiratory: Normal respiratory effort, no problems with respiration noted  Abdomen: Soft,  gravid, appropriate for gestational age. Pain/Pressure: Absent     Pelvic: Vag. Bleeding: None     Cervical exam deferred        Extremities: Normal range of motion.  Edema: None  Mental Status: Normal mood and affect. Normal behavior. Normal judgment and thought content.   Urinalysis:      Assessment and Plan:  Pregnancy: G3P1011 at [redacted]w[redacted]d  1. [redacted] weeks gestation of pregnancy  2. Supervision of other normal pregnancy, antepartum FHT and FH normal  3. Gestational diabetes mellitus (GDM), antepartum, gestational diabetes method of control unspecified Continue with metformin for now - will see how numbers are after this episode of nausea and vomiting  4. Bipolar 1 disorder ([place])  5. Other specified hypothyroidism On synthroid  6. Gastroesophageal reflux disease without esophagitis  7. Intractable vomiting with nausea, unspecified vomiting type Patient vomiting in exam room - will send patient to ED for evaluation.    Preterm labor symptoms and general obstetric precautions including but not limited to vaginal bleeding, contractions, leaking of fluid and fetal movement were reviewed in detail with the patient. Please refer to After Visit Summary for other counseling recommendations.  No follow-ups on file.   Truett Mainland, DO

## 2021-04-02 NOTE — ED Notes (Signed)
Patient placed on TOCO monitor

## 2021-04-02 NOTE — ED Provider Notes (Signed)
[facility] Provider Note   CSN: [id number] Arrival date & time: 04/02/21  1039     History Chief Complaint  Patient presents with   Emesis During Pregnancy    Michele Armstrong is a 35 y.o. female.  HPI Patient is [redacted] weeks gestation.  She was at home yesterday evening and in bed with her son.  She reports she very abruptly got intensely nauseated and had to roll over and she started vomiting.  She reports that she vomited repeatedly for several episodes and then finally was able to go back to sleep.  She reports as soon as she woke up in the morning the intense nausea and vomiting resumed.  She reports she is having some pain in her central and slightly lower back.  She thought it was from the vomiting and different positions she was in.  She denies perceiving much pain in her abdomen although she does qualify for several days or more she has had some lower abdominal discomfort.  She reports she does have history of Braxton Hicks contractions.  She has not had any or abnormal vaginal discharge or bleeding.  She did go to the [facility] office this morning and saw Dr. Nehemiah Settle.  After evaluation she was recommended to come to the emergency department for rehydration and evaluation for vomiting.  At that time there was suspicion of possible food related GI illness.  Patient has not had diarrhea.  No fever.  No history of pancreatitis per the patient.  She reports prior cholecystectomy    Past Medical History:  Diagnosis Date   Anxiety    Arrhythmia    tachycardia   Depression    Fatigue    Gestational diabetes    Headache(784.0)    Hypothyroid    Internal hemorrhoids    PTSD (post-traumatic stress disorder)    Seizures ([place])    Medication related seizure x 1    Patient Active Problem List   Diagnosis Date Noted   Gestational diabetes 02/17/2021   Adjustment disorder with mixed anxiety and depressed mood 01/05/2021   Supervision of other normal pregnancy,  antepartum 10/30/2020   Obesity affecting pregnancy, antepartum 10/30/2020   Bipolar 1 disorder ([place]) 04/21/2020   Elevated LFTs 03/26/2020   Obesity, Class III, BMI 40-49.9 (morbid obesity) ([place]) 01/23/2020   Gastroesophageal reflux disease 05/30/2018   Hemorrhoids 05/30/2018   Stress incontinence, female 10/27/2017   Nevus of right shoulder 10/24/2017   Chronic back pain 08/28/2017   Chronic pain of both knees 08/28/2017   Insomnia 08/28/2017   Prolapsed internal hemorrhoids, grade 2 06/07/2017   Latex allergy 06/07/2017   Large breasts 01/10/2017   Pain aggravated by breast feeding 01/10/2017   PTSD (post-traumatic stress disorder) 08/22/2011   Depression, major, recurrent ([place]) 08/22/2011   Hypothyroid 08/22/2011    Past Surgical History:  Procedure Laterality Date   CHOLECYSTECTOMY     FLEXIBLE SIGMOIDOSCOPY N/A 06/01/2017   Procedure: FLEXIBLE SIGMOIDOSCOPY;  Surgeon: Milus Banister, MD;  Location: [facility];  Service: Endoscopy;  Laterality: N/A;   HEMORRHOID BANDING  2018   WISDOM TOOTH EXTRACTION       OB History     Gravida  3   Para  1   Term  1   Preterm      AB  1   Living  1      SAB      IAB      Ectopic      Multiple  0   Live Births  1           Family History  Problem Relation Age of Onset   Alcohol abuse Father    Drug abuse Father    Hypertension Father    Hypertension Mother    Diabetes Mother    Leukemia Maternal Grandmother    Breast cancer Maternal Aunt    Colon cancer Neg Hx    Stomach cancer Neg Hx     Social History   Tobacco Use   Smoking status: Former    Pack years: 0.00    Types: Cigarettes   Smokeless tobacco: Never   Tobacco comments:    quit smoking about 2 days ago  Vaping Use   Vaping Use: Former  Substance Use Topics   Alcohol use: Not Currently   Drug use: Not Currently    Types: Marijuana    Home Medications Prior to Admission medications   Medication Sig Start Date End Date Taking?  Authorizing Provider  Accu-Chek Softclix Lancets lancets DX: [id number]. Check BS QID 02/17/21   Donnamae Jude, MD  Blood Glucose Monitoring Suppl (ACCU-CHEK GUIDE) w/Device KIT DX: O24:235. Check BS QID 02/17/21   Donnamae Jude, MD  cetirizine (ZYRTEC) 10 MG tablet Take by mouth.    [provider]  glucose blood test strip DX: O24:419. Check BS QID 02/17/21   Donnamae Jude, MD  levothyroxine (SYNTHROID) 100 MCG tablet Take 1 tablet (100 mcg total) by mouth daily before breakfast. 12/01/20   Truett Mainland, DO  metFORMIN (GLUCOPHAGE) 500 MG tablet Take 1 tablet (500 mg total) by mouth at bedtime. 03/02/21   Seabron Spates, CNM  omeprazole (PRILOSEC OTC) 20 MG tablet Take 1 tablet (20 mg total) by mouth daily. 03/02/21   Seabron Spates, CNM  Prenat-FeAsp-Meth-FA-DHA w/o A (PRENATE PIXIE) 10-0.6-0.4-200 MG CAPS Take 1 capsule by mouth daily.    [provider]  pyridOXINE (VITAMIN B-6) 50 MG tablet Take 1 tablet (50 mg total) by mouth in the morning and at bedtime. 03/02/21   Seabron Spates, CNM    Allergies    Morphine and related and Latex  Review of Systems   Review of Systems 10 systems reviewed and negative except as per HPI Physical Exam Updated Vital Signs BP 106/77 (BP Location: Right Arm)   Pulse 78   Temp 98.6 F (37 C) (Oral)   Resp 18   LMP 08/13/2020 (Exact Date)   SpO2 100%   Physical Exam Constitutional:      Comments: Patient is alert.  She is uncomfortable in appearance.  Mental status clear.  No respiratory distress.  Slightly pale in appearance.  HENT:     Head: Normocephalic and atraumatic.     Mouth/Throat:     Pharynx: Oropharynx is clear.  Eyes:     Extraocular Movements: Extraocular movements intact.  Cardiovascular:     Rate and Rhythm: Normal rate and regular rhythm.  Pulmonary:     Effort: Pulmonary effort is normal.     Breath sounds: Normal breath sounds.  Abdominal:     Comments: Gravid abdomen.  Some epigastric discomfort  to palpation.  Mild lower abdominal discomfort to palpation.  Some reproducible pain in the lower thoracic and lumbar back.  Musculoskeletal:        General: No swelling or tenderness. Normal range of motion.     Comments: No peripheral edema.  Calves are soft and pliable  Skin:  General: Skin is warm and dry.     Coloration: Skin is pale.  Neurological:     General: No focal deficit present.     Mental Status: She is oriented to person, place, and time.     Cranial Nerves: No cranial nerve deficit.     Coordination: Coordination normal.  Psychiatric:        Mood and Affect: Mood normal.    ED Results / Procedures / Treatments   Labs (all labs ordered are listed, but only abnormal results are displayed) Labs Reviewed  COMPREHENSIVE METABOLIC PANEL - Abnormal; Notable for the following components:      Result Value   CO2 21 (*)    Glucose, Bld 112 (*)    Calcium 8.3 (*)    Total Protein 6.1 (*)    Albumin 2.8 (*)    All other components within normal limits  LIPASE, BLOOD - Abnormal; Notable for the following components:   Lipase 118 (*)    All other components within normal limits  CBC WITH DIFFERENTIAL/PLATELET - Abnormal; Notable for the following components:   WBC 15.7 (*)    Neutro Abs 13.7 (*)    Abs Immature Granulocytes 0.08 (*)    All other components within normal limits  MAGNESIUM - Abnormal; Notable for the following components:   Magnesium 1.6 (*)    All other components within normal limits  LACTIC ACID, PLASMA  PHOSPHORUS  URINALYSIS, ROUTINE W REFLEX MICROSCOPIC    EKG None  Radiology No results found.  Procedures Procedures   Medications Ordered in ED Medications  magnesium sulfate IVPB 1 g 100 mL (1 g Intravenous New Bag/Given 04/02/21 1359)  famotidine (PEPCID) IVPB 20 mg premix (has no administration in time range)  lactated ringers bolus 2,000 mL (2,000 mLs Intravenous New Bag/Given 04/02/21 1223)  metoCLOPramide (REGLAN) injection 10 mg (10  mg Intravenous Given 04/02/21 1351)  diphenhydrAMINE (BENADRYL) injection 25 mg (25 mg Intravenous Given 04/02/21 1351)    ED Course  I have reviewed the triage vital signs and the nursing notes.  Pertinent labs & imaging results that were available during my care of the patient were reviewed by me and considered in my medical decision making (see chart for details).    MDM Rules/Calculators/A&P                          Consult: Reviewed with Dr. Kennon Rounds for admission.  Patient presents as outlined.  He is at [redacted] weeks gestation.  She had aggressive onset of vomiting yesterday evening that resumed this morning.  Patient reports significant pain in her lower thoracic and upper lumbar back.  Lipase is mildly elevated.  Baseline lipase does not appear to have elevation.  Patient denies history of pancreatitis.  She has had cholecystectomy.  At this time, with combination of significant back pain and intractable vomiting with third trimester pregnancy, will plan for observation at [facility] for continued hydration and symptomatic control.  To be determined at their evaluation if additional imaging indicated.  Patient is not showing signs of an acute abdomen.  No active contractions.  Currently no sign of preterm labor.  Stable for transfer. Final Clinical Impression(s) / ED Diagnoses Final diagnoses:  Intractable vomiting with nausea  Third trimester pregnancy at less than 36 weeks  Abnormal serum level of lipase    Rx / DC Orders ED Discharge Orders     None  Charlesetta Shanks, MD 04/02/21 1423

## 2021-04-03 DIAGNOSIS — O24415 Gestational diabetes mellitus in pregnancy, controlled by oral hypoglycemic drugs: Secondary | ICD-10-CM

## 2021-04-03 DIAGNOSIS — Z3A33 33 weeks gestation of pregnancy: Secondary | ICD-10-CM

## 2021-04-03 DIAGNOSIS — O219 Vomiting of pregnancy, unspecified: Secondary | ICD-10-CM | POA: Diagnosis present

## 2021-04-03 DIAGNOSIS — K859 Acute pancreatitis without necrosis or infection, unspecified: Secondary | ICD-10-CM | POA: Diagnosis not present

## 2021-04-03 LAB — GLUCOSE, CAPILLARY
Glucose-Capillary: 100 mg/dL — ABNORMAL HIGH (ref 70–99)
Glucose-Capillary: 104 mg/dL — ABNORMAL HIGH (ref 70–99)

## 2021-04-03 LAB — COMPREHENSIVE METABOLIC PANEL
ALT: 15 U/L (ref 0–44)
AST: 15 U/L (ref 15–41)
Albumin: 2.2 g/dL — ABNORMAL LOW (ref 3.5–5.0)
Alkaline Phosphatase: 78 U/L (ref 38–126)
Anion gap: 6 (ref 5–15)
BUN: <5 mg/dL — ABNORMAL LOW (ref 6–20)
CO2: 22 mmol/L (ref 22–32)
Calcium: 8 mg/dL — ABNORMAL LOW (ref 8.9–10.3)
Chloride: 109 mmol/L (ref 98–111)
Creatinine, Ser: 0.6 mg/dL (ref 0.44–1.00)
GFR, Estimated: >60 mL/min (ref >60)
Glucose, Bld: 105 mg/dL — ABNORMAL HIGH (ref 70–99)
Potassium: 3.7 mmol/L (ref 3.5–5.1)
Sodium: 137 mmol/L (ref 135–145)
Total Bilirubin: 0.6 mg/dL (ref 0.3–1.2)
Total Protein: 5.2 g/dL — ABNORMAL LOW (ref 6.5–8.1)

## 2021-04-03 LAB — LIPASE, BLOOD: Lipase: 24 U/L (ref 11–51)

## 2021-04-03 MED ORDER — SCOPOLAMINE 1 MG/3DAYS TD PT72: 1.0000 | MEDICATED_PATCH | TRANSDERMAL | 2 refills | Status: DC

## 2021-04-03 MED ORDER — ONDANSETRON 4 MG PO TBDP: 4.0000 mg | ORAL_TABLET | Freq: Four times a day (QID) | ORAL | 2 refills | Status: DC | PRN

## 2021-04-03 NOTE — Progress Notes (Signed)
Inpatient Diabetes Program Recommendations  AACE/ADA: New Consensus Statement on Inpatient Glycemic Control (2015)  Target Ranges:  Prepandial:   less than 140 mg/dL      Peak postprandial:   less than 180 mg/dL (1-2 hours)      Critically ill patients:  140 - 180 mg/dL   Lab Results  Component Value Date   GLUCAP 82 04/02/2021   HGBA1C 5.6 10/30/2020    Review of Glycemic Control  Diabetes history: gestational diabetes Outpatient Diabetes medications: Metformin 500 mg at HS Current orders for Inpatient glycemic control: Novolog 0-24 units TID after meals, Metformin 500 mg at [facility]  Inpatient Diabetes Program Recommendations:   Received diabetes coordinator consult to follow. Noted that lipase was 118 at 12 noon yesterday and is now 24 at 0735 today.   If patient is to remain in hospital, recommend holding the Metformin until patient is discharged in order to give the pancreas a rest. Seems that the lipase level is down to normal level now.   Will continue to monitor blood sugars while in the hospital.  Harvel Ricks RN BSN CDE Diabetes Coordinator Pager: [phone]  8am-5pm

## 2021-04-03 NOTE — Progress Notes (Addendum)
[facility]ulty [facility] OB/GYN Attending Note  Subjective:  Patient feels remarkably better, no further emesis. Has tolerated food x 2, mostly clears. No abdominal or back pain.  FHR reassuring, no contractions, no LOF or vaginal bleeding. Good FM.   Admitted on 04/02/2021 for Pancreatitis.    Objective:  Blood pressure 108/76, pulse 79, temperature 97.8 F (36.6 C), temperature source Oral, resp. rate 16, last menstrual period 08/13/2020, SpO2 99 %, currently breastfeeding. FHT  Baseline 135 bpm, moderate variability, +accelerations, no decelerations Toco: None Gen: NAD HENT: Normocephalic, atraumatic Lungs: Normal respiratory effort Heart: Regular rate noted Abdomen: NT,gravid fundus, soft Cervix: Deferred Ext: 2+ DTRs, no edema, no cyanosis, negative Homan's sign  Results for orders placed or performed during the hospital encounter of 04/02/21 (from the past 24 hour(s))  CBC     Status: Abnormal   Collection Time: 04/02/21  6:17 PM  Result Value Ref Range   WBC 10.7 (H) 4.0 - 10.5 K/uL   RBC 3.81 (L) 3.87 - 5.11 MIL/uL   Hemoglobin 11.2 (L) 12.0 - 15.0 g/dL   HCT 33.8 (L) 36.0 - 46.0 %   MCV 88.7 80.0 - 100.0 fL   MCH 29.4 26.0 - 34.0 pg   MCHC 33.1 30.0 - 36.0 g/dL   RDW 13.9 11.5 - 15.5 %   Platelets 247 150 - 400 K/uL   nRBC 0.0 0.0 - 0.2 %  Creatinine, serum     Status: None   Collection Time: 04/02/21  6:17 PM  Result Value Ref Range   Creatinine, Ser 0.51 0.44 - 1.00 mg/dL   GFR, Estimated >60 >60 mL/min  Glucose, capillary     Status: Abnormal   Collection Time: 04/02/21  8:13 PM  Result Value Ref Range   Glucose-Capillary 136 (H) 70 - 99 mg/dL  Glucose, capillary     Status: None   Collection Time: 04/02/21 10:23 PM  Result Value Ref Range   Glucose-Capillary 82 70 - 99 mg/dL  Comprehensive metabolic panel     Status: Abnormal   Collection Time: 04/03/21  7:35 AM  Result Value Ref Range   Sodium 137 135 - 145 mmol/L   Potassium 3.7 3.5 - 5.1 mmol/L    Chloride 109 98 - 111 mmol/L   CO2 22 22 - 32 mmol/L   Glucose, Bld 105 (H) 70 - 99 mg/dL   BUN <5 (L) 6 - 20 mg/dL   Creatinine, Ser 0.60 0.44 - 1.00 mg/dL   Calcium 8.0 (L) 8.9 - 10.3 mg/dL   Total Protein 5.2 (L) 6.5 - 8.1 g/dL   Albumin 2.2 (L) 3.5 - 5.0 g/dL   AST 15 15 - 41 U/L   ALT 15 0 - 44 U/L   Alkaline Phosphatase 78 38 - 126 U/L   Total Bilirubin 0.6 0.3 - 1.2 mg/dL   GFR, Estimated >60 >60 mL/min   Anion gap 6 5 - 15  Lipase, blood     Status: None   Collection Time: 04/03/21  7:35 AM  Result Value Ref Range   Lipase 24 11 - 51 U/L  Glucose, capillary     Status: Abnormal   Collection Time: 04/03/21  9:37 AM  Result Value Ref Range   Glucose-Capillary 100 (H) 70 - 99 mg/dL    Assessment & Plan:  35 y.o. G3P1011 at [redacted]w[redacted]d admitted for pancreatitis, now resolved given lipase is 24. No N/V, tolerating clear diet.  Patient just ordered another tray of food, if tolerates that, will  be discharged to home with precautions to come back for any worsening symptoms.  Continue Metformin and Synthroid. No other OB concens, Category 1 FHR tracing.    Verita Schneiders, MD, [place] for [employer], [place]

## 2021-04-03 NOTE — Discharge Summary (Signed)
Antenatal Physician Discharge Summary  Patient ID: Michele Armstrong MRN: [id number] DOB/AGE: Mar 06, 1986 35 y.o.  Admit date: 04/02/2021 Discharge date: 04/03/2021  Admission Diagnoses: Principal Problem:   Pancreatitis Active Problems:   Hypothyroid   Gestational diabetes   [redacted] weeks gestation of pregnancy   Nausea and vomiting during pregnancy  Discharge Diagnoses:  Resolved pancreatitis  Prenatal Procedures: NST and ultrasound  Consults: None  Hospital Course:  Maley Venezia is a 35 y.o. G3P1011 with IUP at [redacted]w[redacted]d admitted for nausea and vomiting and pancreatis with a lipase of  118. History of cholecystectomy.   She was given IV fluids, antiemetics. After several hours, her symptoms resolved and she was able to tolerate liquid diet.  On the second day of admission, she was remarkable better with no symptoms. Lipase normalized to 24. Was tolerating food and wanted to go home.   The fetal heart rate monitoring remained reassuring, and she had no signs/symptoms of preterm labor or other maternal-fetal concerns.   She was deemed stable for discharge to home with outpatient follow up.  Discharge Exam: Temp:  [97.8 F (36.6 C)-100 F (37.8 C)] 97.8 F (36.6 C) (07/02 0917) Pulse Rate:  [78-102] 79 (07/02 0917) Resp:  [12-20] 16 (07/02 0917) BP: (100-123)/(52-83) 108/76 (07/02 0917) SpO2:  [96 %-100 %] 99 % (07/02 0917) Physical Examination: CONSTITUTIONAL: Well-developed, well-nourished female in no acute distress.  [place]: Alert and oriented to person, place, and time. Normal reflexes, muscle tone coordination. No cranial nerve deficit noted. PSYCHIATRIC: Normal mood and affect. Normal behavior. Normal judgment and thought content. CARDIOVASCULAR: Normal heart rate noted, regular rhythm RESPIRATORY: Effort and breath sounds normal, no problems with respiration noted MUSCULOSKELETAL: Normal range of motion. No edema and no tenderness. 2+ distal pulses. ABDOMEN: Soft, nontender,  nondistended, gravid. CERVIX:    Fetal monitoring: FHR: 135 bpm, Variability: moderate, Accelerations: Present, Decelerations: Absent  Uterine activity: No contractions  Significant Diagnostic Studies:  Results for orders placed or performed during the hospital encounter of 04/02/21 (from the past 168 hour(s))  CBC   Collection Time: 04/02/21  6:17 PM  Result Value Ref Range   WBC 10.7 (H) 4.0 - 10.5 K/uL   RBC 3.81 (L) 3.87 - 5.11 MIL/uL   Hemoglobin 11.2 (L) 12.0 - 15.0 g/dL   HCT 33.8 (L) 36.0 - 46.0 %   MCV 88.7 80.0 - 100.0 fL   MCH 29.4 26.0 - 34.0 pg   MCHC 33.1 30.0 - 36.0 g/dL   RDW 13.9 11.5 - 15.5 %   Platelets 247 150 - 400 K/uL   nRBC 0.0 0.0 - 0.2 %  Creatinine, serum   Collection Time: 04/02/21  6:17 PM  Result Value Ref Range   Creatinine, Ser 0.51 0.44 - 1.00 mg/dL   GFR, Estimated >60 >60 mL/min  Glucose, capillary   Collection Time: 04/02/21  8:13 PM  Result Value Ref Range   Glucose-Capillary 136 (H) 70 - 99 mg/dL  Glucose, capillary   Collection Time: 04/02/21 10:23 PM  Result Value Ref Range   Glucose-Capillary 82 70 - 99 mg/dL  Comprehensive metabolic panel   Collection Time: 04/03/21  7:35 AM  Result Value Ref Range   Sodium 137 135 - 145 mmol/L   Potassium 3.7 3.5 - 5.1 mmol/L   Chloride 109 98 - 111 mmol/L   CO2 22 22 - 32 mmol/L   Glucose, Bld 105 (H) 70 - 99 mg/dL   BUN <5 (L) 6 - 20 mg/dL   Creatinine, Ser 0.60  0.44 - 1.00 mg/dL   Calcium 8.0 (L) 8.9 - 10.3 mg/dL   Total Protein 5.2 (L) 6.5 - 8.1 g/dL   Albumin 2.2 (L) 3.5 - 5.0 g/dL   AST 15 15 - 41 U/L   ALT 15 0 - 44 U/L   Alkaline Phosphatase 78 38 - 126 U/L   Total Bilirubin 0.6 0.3 - 1.2 mg/dL   GFR, Estimated >63 >07 mL/min   Anion gap 6 5 - 15  Lipase, blood   Collection Time: 04/03/21  7:35 AM  Result Value Ref Range   Lipase 24 11 - 51 U/L  Glucose, capillary   Collection Time: 04/03/21  9:37 AM  Result Value Ref Range   Glucose-Capillary 100 (H) 70 - 99 mg/dL   Results for orders placed or performed during the hospital encounter of 04/02/21 (from the past 168 hour(s))  Comprehensive metabolic panel   Collection Time: 04/02/21 12:02 PM  Result Value Ref Range   Sodium 137 135 - 145 mmol/L   Potassium 3.6 3.5 - 5.1 mmol/L   Chloride 108 98 - 111 mmol/L   CO2 21 (L) 22 - 32 mmol/L   Glucose, Bld 112 (H) 70 - 99 mg/dL   BUN 8 6 - 20 mg/dL   Creatinine, Ser 3.16 0.44 - 1.00 mg/dL   Calcium 8.3 (L) 8.9 - 10.3 mg/dL   Total Protein 6.1 (L) 6.5 - 8.1 g/dL   Albumin 2.8 (L) 3.5 - 5.0 g/dL   AST 15 15 - 41 U/L   ALT 16 0 - 44 U/L   Alkaline Phosphatase 97 38 - 126 U/L   Total Bilirubin 0.3 0.3 - 1.2 mg/dL   GFR, Estimated >44 >62 mL/min   Anion gap 8 5 - 15  Lipase, blood   Collection Time: 04/02/21 12:02 PM  Result Value Ref Range   Lipase 118 (H) 11 - 51 U/L  Lactic acid, plasma   Collection Time: 04/02/21 12:02 PM  Result Value Ref Range   Lactic Acid, Venous 1.2 0.5 - 1.9 mmol/L  CBC with Differential   Collection Time: 04/02/21 12:02 PM  Result Value Ref Range   WBC 15.7 (H) 4.0 - 10.5 K/uL   RBC 4.14 3.87 - 5.11 MIL/uL   Hemoglobin 12.1 12.0 - 15.0 g/dL   HCT 45.0 06.9 - 56.6 %   MCV 88.6 80.0 - 100.0 fL   MCH 29.2 26.0 - 34.0 pg   MCHC 33.0 30.0 - 36.0 g/dL   RDW 52.1 19.2 - 63.6 %   Platelets 280 150 - 400 K/uL   nRBC 0.0 0.0 - 0.2 %   Neutrophils Relative % 87 %   Neutro Abs 13.7 (H) 1.7 - 7.7 K/uL   Lymphocytes Relative 7 %   Lymphs Abs 1.0 0.7 - 4.0 K/uL   Monocytes Relative 5 %   Monocytes Absolute 0.8 0.1 - 1.0 K/uL   Eosinophils Relative 0 %   Eosinophils Absolute 0.0 0.0 - 0.5 K/uL   Basophils Relative 0 %   Basophils Absolute 0.0 0.0 - 0.1 K/uL   Immature Granulocytes 1 %   Abs Immature Granulocytes 0.08 (H) 0.00 - 0.07 K/uL  Magnesium   Collection Time: 04/02/21 12:02 PM  Result Value Ref Range   Magnesium 1.6 (L) 1.7 - 2.4 mg/dL  Phosphorus   Collection Time: 04/02/21 12:02 PM  Result Value Ref Range    Phosphorus 3.3 2.5 - 4.6 mg/dL  Resp Panel by RT-PCR (Flu A&B, Covid) Nasopharyngeal Swab  Collection Time: 04/02/21  3:58 PM   Specimen: Nasopharyngeal Swab; Nasopharyngeal(NP) swabs in vial transport medium  Result Value Ref Range   SARS Coronavirus 2 by RT PCR NEGATIVE NEGATIVE   Influenza A by PCR NEGATIVE NEGATIVE   Influenza B by PCR NEGATIVE NEGATIVE   US Abdomen Complete  Result Date: 04/02/2021 CLINICAL DATA:  Pancreatitis [redacted] weeks pregnant EXAM: ABDOMEN ULTRASOUND COMPLETE COMPARISON:  MRI 01/06/2021 FINDINGS: Gallbladder: Surgically absent Common bile duct: Diameter: 5 mm Liver: No focal lesion identified. Within normal limits in parenchymal echogenicity. Portal vein is patent on color Doppler imaging with normal direction of blood flow towards the liver. IVC: No abnormality visualized. Pancreas: Poorly visible due to bowel gas. Spleen: Size and appearance within normal limits. Right Kidney: Length: 11.8 cm. Echogenicity within normal limits. No mass or hydronephrosis visualized. Left Kidney: Length: 11.7 cm. Echogenicity within normal limits. No mass or hydronephrosis visualized. Abdominal aorta: No aneurysm visualized. Other findings: None. IMPRESSION: 1. Status post cholecystectomy. 2. Poorly visible pancreas secondary to bowel gas. Electronically Signed   By: Donavan Foil M.D.   On: 04/02/2021 22:45   [place] MFM OB FOLLOW UP  Result Date: 03/23/2021 ----------------------------------------------------------------------  OBSTETRICS REPORT                       (Signed Final 03/23/2021 05:28 pm) ---------------------------------------------------------------------- Patient Info  ID #:       [id number]                          D.O.B.:  December 02, 1985 (35 yrs)  Name:       Virgina Organ                  Visit Date: 03/23/2021 03:38 pm ---------------------------------------------------------------------- Performed By  Attending:        Johnell Comings MD         Ref. Address:     [place]  Performed By:     Lacretia Leigh       [facility]                                                             [place] [facility]                                                             [facility]  Referred By:      Truett Mainland                    MD ---------------------------------------------------------------------- Orders  #  Description                           Code        Ordered By  1  [place] MFM OB FOLLOW UP                   [phone]    YU FANG ----------------------------------------------------------------------  #  Order #                     Accession #                Episode #  1  [id number]                   [id number]                 [id number] ---------------------------------------------------------------------- Indications  Gestational diabetes in pregnancy, diet        O24.410  controlled  Advanced maternal age multigravida 80+,        O37.522  second trimester (38 yrs)  Obesity complicating pregnancy, third          O99.213  trimester  [redacted] weeks gestation of pregnancy                Z3A.31  Hypothyroid                                    O99.280 E03.9  Encounter for other antenatal screening        Z36.2  follow-up  Low Risk NIPS ---------------------------------------------------------------------- Vital Signs                                                 Height:        5'4" ---------------------------------------------------------------------- Fetal Evaluation  Num Of Fetuses:         1  Preg. Location:         Intrauterine  Fetal Heart Rate(bpm):  145  Cardiac Activity:       Observed  Presentation:           Breech  Placenta:               Anterior  P. Cord Insertion:      Previously Visualized  Amniotic  Fluid  AFI FV:      Within normal limits  AFI Sum(cm)     %Tile       Largest Pocket(cm)  14.55           51          5.18  RUQ(cm)       RLQ(cm)       LUQ(cm)        LLQ(cm)  5.18          4.4           0              4.97 ---------------------------------------------------------------------- Biometry  BPD:      74.1  mm     G. Age:  29w 5d        2.9  %    CI:        72.01   %    70 - 86                                                          FL/HC:      20.1   %    19.1 - 21.3  HC:      277.9  mm     G. Age:  30w 3d        1.9  %    HC/AC:      0.99        0.96 - 1.17  AC:      279.5  mm     G. Age:  32w 0d         57  %    FL/BPD:     75.3   %    71 - 87  FL:       55.8  mm     G. Age:  29w 3d        1.7  %    FL/AC:      20.0   %    20 - 24  HUM:      53.4  mm     G. Age:  31w 1d         38  %  LV:        2.8  mm  Est. FW:    1651  gm    3 lb 10 oz      16  % ---------------------------------------------------------------------- OB History  Gravidity:    3         Term:   1        Prem:   0        SAB:   0  TOP:          0       Ectopic:  0        Living: 1 ---------------------------------------------------------------------- Gestational Age  LMP:           31w 5d        Date:  08/13/20                 EDD:   05/20/21  U/S Today:     30w 3d  EDD:   05/29/21  Best:          31w 5d     Det. By:  LMP  (08/13/20)          EDD:   05/20/21 ---------------------------------------------------------------------- Anatomy  Cranium:               Appears normal         Aortic Arch:            Previously seen  Cavum:                 Appears normal         Ductal Arch:            Previously seen  Ventricles:            Appears normal         Diaphragm:              Appears normal  Choroid Plexus:        Previously seen        Stomach:                Appears normal, left                                                                        sided  Cerebellum:            Previously seen         Abdomen:                Previously seen  Posterior Fossa:       Previously seen        Abdominal Wall:         Previously seen  Nuchal Fold:           Not applicable (>37    Cord Vessels:           Previously seen                         wks GA)  Face:                  Orbits and profile     Kidneys:                Appear normal                         previously seen  Lips:                  Appears normal         Bladder:                Appears normal  Thoracic:              Appears normal         Spine:                  Previously seen  Heart:                 Appears normal         Upper Extremities:  Previously seen                         (4CH, axis, and                         situs)  RVOT:                  Previously seen        Lower Extremities:      Previously seen  LVOT:                  Previously seen  Other:  Fetus previously appears to be a female. Heels previously visualized.          Open hands previously visualized. Technically difficult due to          maternal habitus and fetal position. ---------------------------------------------------------------------- Cervix Uterus Adnexa  Cervix  Not visualized (advanced GA >24wks)  Uterus  No abnormality visualized.  Right Ovary  Not visualized.  Left Ovary  Not visualized.  Cul De Sac  No free fluid seen.  Adnexa  No abnormality visualized. ---------------------------------------------------------------------- Comments  This patient was seen for a follow up growth scan due to  gestational diabetes treated with metformin and advanced  maternal age.  She denies any problems since her last exam.  She was informed that the fetal growth and amniotic fluid  level appears appropriate for her gestational age.  Due to gestational diabetes treated with metformin, we will  start weekly biophysical profiles at 32 weeks.  A biophysical profile was scheduled in 1 week. ----------------------------------------------------------------------                    Johnell Comings, MD Electronically Signed Final Report   03/23/2021 05:28 pm ----------------------------------------------------------------------   Future Appointments  Date Time Provider [place]  04/06/2021  2:15 PM [facility] NST [facility]  04/12/2021  2:15 PM [facility]ST [facility]  04/15/2021  3:30 PM Truett Mainland, DO [facility] None  04/20/2021  3:00 PM [facility] NURSE [facility]  04/20/2021  3:15 PM [facility] US2 [facility]  04/29/2021 10:00 AM Constant, Peggy, MD [facility] None    Discharge Condition: Stable  Discharge disposition: 01-Home or Self Care        Allergies as of 04/03/2021       Reactions   Morphine And Related Nausea And Vomiting   Latex Rash   Causes UTI when condom used.        Medication List     TAKE these medications    Accu-Chek Guide w/Device Kit DX: P53:748. Check BS QID   Accu-Chek Softclix Lancets lancets DX: O70:786. Check BS QID   cetirizine 10 MG tablet Commonly known as: ZYRTEC Take by mouth.   glucose blood test strip DX: O24:419. Check BS QID   levothyroxine 100 MCG tablet Commonly known as: Synthroid Take 1 tablet (100 mcg total) by mouth daily before breakfast.   metFORMIN 500 MG tablet Commonly known as: Glucophage Take 1 tablet (500 mg total) by mouth at bedtime.   omeprazole 20 MG tablet Commonly known as: PriLOSEC OTC Take 1 tablet (20 mg total) by mouth daily.   ondansetron 4 MG disintegrating tablet Commonly known as: Zofran ODT Take 1 tablet (4 mg total) by mouth every 6 (six) hours as needed for nausea.   Prenate Pixie 10-0.6-0.4-200 MG Caps Take 1 capsule by mouth daily.   pyridOXINE 50  MG tablet Commonly known as: VITAMIN B-6 Take 1 tablet (50 mg total) by mouth in the morning and at bedtime.   scopolamine 1 MG/3DAYS Commonly known as: TRANSDERM-SCOP Place 1 patch (1.5 mg total) onto the skin every 3 (three) days. Start taking on: April 05, 2021         Total discharge time: 15 minutes    Signed: Verita Schneiders M.D. 04/03/2021, 10:27 AM

## 2021-04-06 ENCOUNTER — Other Ambulatory Visit: Payer: Self-pay

## 2021-04-06 MED ORDER — SCOPOLAMINE 1 MG/3DAYS TD PT72: 1.0000 | MEDICATED_PATCH | TRANSDERMAL | 2 refills | Status: DC

## 2021-04-06 MED ORDER — METFORMIN HCL 500 MG PO TABS: 1000.0000 mg | ORAL_TABLET | Freq: Every day | ORAL | 3 refills | Status: DC

## 2021-04-06 MED ORDER — PROMETHAZINE HCL 25 MG RE SUPP: 25.0000 mg | Freq: Four times a day (QID) | RECTAL | 6 refills | Status: DC | PRN

## 2021-04-07 ENCOUNTER — Encounter: Payer: Self-pay | Admitting: *Deleted

## 2021-04-07 ENCOUNTER — Other Ambulatory Visit: Payer: Self-pay

## 2021-04-07 ENCOUNTER — Ambulatory Visit: Payer: Medicaid Other | Attending: Obstetrics and Gynecology

## 2021-04-07 ENCOUNTER — Ambulatory Visit: Payer: Medicaid Other | Admitting: *Deleted

## 2021-04-07 VITALS — BP 122/73 | HR 98

## 2021-04-07 DIAGNOSIS — O24415 Gestational diabetes mellitus in pregnancy, controlled by oral hypoglycemic drugs: Secondary | ICD-10-CM | POA: Insufficient documentation

## 2021-04-07 DIAGNOSIS — Z3480 Encounter for supervision of other normal pregnancy, unspecified trimester: Secondary | ICD-10-CM | POA: Insufficient documentation

## 2021-04-07 DIAGNOSIS — Z3A33 33 weeks gestation of pregnancy: Secondary | ICD-10-CM

## 2021-04-12 ENCOUNTER — Ambulatory Visit (INDEPENDENT_AMBULATORY_CARE_PROVIDER_SITE_OTHER): Payer: Medicaid Other

## 2021-04-12 ENCOUNTER — Ambulatory Visit: Payer: Medicaid Other | Admitting: *Deleted

## 2021-04-12 VITALS — BP 109/69 | HR 86 | Wt 230.1 lb

## 2021-04-12 DIAGNOSIS — O24415 Gestational diabetes mellitus in pregnancy, controlled by oral hypoglycemic drugs: Secondary | ICD-10-CM

## 2021-04-12 DIAGNOSIS — O99210 Obesity complicating pregnancy, unspecified trimester: Secondary | ICD-10-CM

## 2021-04-12 NOTE — Progress Notes (Signed)

## 2021-04-15 ENCOUNTER — Ambulatory Visit (INDEPENDENT_AMBULATORY_CARE_PROVIDER_SITE_OTHER): Payer: Medicaid Other | Admitting: Family Medicine

## 2021-04-15 ENCOUNTER — Encounter: Payer: Medicaid Other | Admitting: Family Medicine

## 2021-04-15 ENCOUNTER — Other Ambulatory Visit: Payer: Self-pay

## 2021-04-15 VITALS — BP 112/59 | HR 84 | Wt 230.8 lb

## 2021-04-15 DIAGNOSIS — O24415 Gestational diabetes mellitus in pregnancy, controlled by oral hypoglycemic drugs: Secondary | ICD-10-CM

## 2021-04-15 DIAGNOSIS — K219 Gastro-esophageal reflux disease without esophagitis: Secondary | ICD-10-CM

## 2021-04-15 DIAGNOSIS — Z3480 Encounter for supervision of other normal pregnancy, unspecified trimester: Secondary | ICD-10-CM

## 2021-04-15 MED ORDER — INSULIN PEN NEEDLE 31G X 6 MM MISC: 1.0000 [IU] | Freq: Every day | 3 refills | Status: DC

## 2021-04-15 MED ORDER — METFORMIN HCL 500 MG PO TABS: 500.0000 mg | ORAL_TABLET | Freq: Every day | ORAL | 3 refills | Status: DC

## 2021-04-15 MED ORDER — LEVEMIR FLEXTOUCH 100 UNIT/ML ~~LOC~~ SOPN: 25.0000 [IU] | PEN_INJECTOR | Freq: Every day | SUBCUTANEOUS | 11 refills | Status: DC

## 2021-04-15 NOTE — Progress Notes (Signed)
   PRENATAL VISIT NOTE  Subjective:  Michele Armstrong is a 35 y.o. G3P1011 at [redacted]w[redacted]d being seen today for ongoing prenatal care.  She is currently monitored for the following issues for this high-risk pregnancy and has PTSD (post-traumatic stress disorder); Depression, major, recurrent ([place]); Hypothyroid; Prolapsed internal hemorrhoids, grade 2; Latex allergy; Stress incontinence, female; Supervision of other normal pregnancy, antepartum; Obesity affecting pregnancy, antepartum; Adjustment disorder with mixed anxiety and depressed mood; Bipolar 1 disorder ([place]); Chronic back pain; Chronic pain of both knees; Elevated LFTs; Gastroesophageal reflux disease; Hemorrhoids; Insomnia; Large breasts; Nevus of right shoulder; Obesity, Class III, BMI 40-49.9 (morbid obesity) ([place]); Pain aggravated by breast feeding; Gestational diabetes; Pancreatitis; [redacted] weeks gestation of pregnancy; and Nausea and vomiting during pregnancy on their problem list.  Patient reports no complaints.  Contractions: Not present. Vag. Bleeding: None.  Movement: Present. Denies leaking of fluid.   The following portions of the patient's history were reviewed and updated as appropriate: allergies, current medications, past family history, past medical history, past social history, past surgical history and problem list.   Objective:   Vitals:   04/15/21 1542  BP: (!) 112/59  Pulse: 84  Weight: 230 lb 12.8 oz (104.7 kg)    Fetal Status: Fetal Heart Rate (bpm): 142   Movement: Present     General:  Alert, oriented and cooperative. Patient is in no acute distress.  Skin: Skin is warm and dry. No rash noted.   Cardiovascular: Normal heart rate noted  Respiratory: Normal respiratory effort, no problems with respiration noted  Abdomen: Soft, gravid, appropriate for gestational age.  Pain/Pressure: Present     Pelvic: Cervical exam deferred        Extremities: Normal range of motion.     Mental Status: Normal mood and affect. Normal  behavior. Normal judgment and thought content.   Assessment and Plan:  Pregnancy: G3P1011 at [redacted]w[redacted]d 1. Supervision of other normal pregnancy, antepartum FHT normal  2. Gestational diabetes mellitus (GDM) in third trimester controlled on oral hypoglycemic drug [place] still elevated. Having nausea at night. Decrease to metformin to 500mg  at bedtime Start levemir 25 units at bedtime  3. Gastroesophageal reflux disease without esophagitis  4. Obesity, Class III, BMI 40-49.9 (morbid obesity) ([place])   Preterm labor symptoms and general obstetric precautions including but not limited to vaginal bleeding, contractions, leaking of fluid and fetal movement were reviewed in detail with the patient. Please refer to After Visit Summary for other counseling recommendations.   Return in about 1 week (around 04/22/2021).  Future Appointments  Date Time Provider [place]  04/20/2021  3:00 PM [facility] NURSE [facility]  04/20/2021  3:15 PM [facility] US2 [facility]  04/26/2021  3:15 PM [facility] NST [facility]  04/29/2021 10:00 AM Constant, Peggy, MD [facility] None  05/03/2021  2:15 PM [facility]  05/10/2021  2:15 PM [facility] NST [facility] [place], DO

## 2021-04-20 ENCOUNTER — Ambulatory Visit: Payer: Medicaid Other | Attending: Obstetrics

## 2021-04-20 ENCOUNTER — Encounter: Payer: Self-pay | Admitting: *Deleted

## 2021-04-20 ENCOUNTER — Ambulatory Visit: Payer: Medicaid Other | Admitting: *Deleted

## 2021-04-20 ENCOUNTER — Other Ambulatory Visit: Payer: Self-pay | Admitting: Obstetrics

## 2021-04-20 ENCOUNTER — Other Ambulatory Visit: Payer: Self-pay

## 2021-04-20 VITALS — BP 125/82 | HR 97

## 2021-04-20 DIAGNOSIS — O24415 Gestational diabetes mellitus in pregnancy, controlled by oral hypoglycemic drugs: Secondary | ICD-10-CM

## 2021-04-20 DIAGNOSIS — Z3480 Encounter for supervision of other normal pregnancy, unspecified trimester: Secondary | ICD-10-CM | POA: Diagnosis present

## 2021-04-20 DIAGNOSIS — Z3A35 35 weeks gestation of pregnancy: Secondary | ICD-10-CM

## 2021-04-20 DIAGNOSIS — O09523 Supervision of elderly multigravida, third trimester: Secondary | ICD-10-CM

## 2021-04-20 DIAGNOSIS — O24414 Gestational diabetes mellitus in pregnancy, insulin controlled: Secondary | ICD-10-CM

## 2021-04-20 DIAGNOSIS — E669 Obesity, unspecified: Secondary | ICD-10-CM

## 2021-04-20 DIAGNOSIS — O99213 Obesity complicating pregnancy, third trimester: Secondary | ICD-10-CM | POA: Diagnosis not present

## 2021-04-20 DIAGNOSIS — O99283 Endocrine, nutritional and metabolic diseases complicating pregnancy, third trimester: Secondary | ICD-10-CM | POA: Diagnosis not present

## 2021-04-20 DIAGNOSIS — E039 Hypothyroidism, unspecified: Secondary | ICD-10-CM | POA: Diagnosis not present

## 2021-04-20 NOTE — Procedures (Signed)
Michele Armstrong [birth year] [redacted]w[redacted]d  Fetus A Non-Stress Test Interpretation for 04/20/21  Indication: IUGR  Fetal Heart Rate A Mode: External Baseline Rate (A): 140 bpm Variability: Moderate Accelerations: 15 x 15 Decelerations: None Multiple birth?: No  Uterine Activity Mode: Palpation, Toco Contraction Frequency (min): None Resting Tone Palpated: Relaxed Resting Time: Adequate  Interpretation (Fetal Testing) Nonstress Test Interpretation: Reactive Comments: Dr. Donalee Citrin reviewed tracing.

## 2021-04-21 ENCOUNTER — Ambulatory Visit (INDEPENDENT_AMBULATORY_CARE_PROVIDER_SITE_OTHER): Payer: Medicaid Other | Admitting: Obstetrics & Gynecology

## 2021-04-21 ENCOUNTER — Other Ambulatory Visit (HOSPITAL_COMMUNITY)
Admission: RE | Admit: 2021-04-21 | Discharge: 2021-04-21 | Disposition: A | Discharge status: Home / Self Care | Payer: Medicaid Other | Source: Ambulatory Visit | Attending: Obstetrics & Gynecology | Admitting: Obstetrics & Gynecology

## 2021-04-21 ENCOUNTER — Encounter: Payer: Self-pay | Admitting: Obstetrics & Gynecology

## 2021-04-21 VITALS — BP 95/64 | HR 75 | Wt 227.0 lb

## 2021-04-21 DIAGNOSIS — O24414 Gestational diabetes mellitus in pregnancy, insulin controlled: Secondary | ICD-10-CM

## 2021-04-21 DIAGNOSIS — O365930 Maternal care for other known or suspected poor fetal growth, third trimester, not applicable or unspecified: Secondary | ICD-10-CM

## 2021-04-21 DIAGNOSIS — Z3A35 35 weeks gestation of pregnancy: Secondary | ICD-10-CM

## 2021-04-21 DIAGNOSIS — O0993 Supervision of high risk pregnancy, unspecified, third trimester: Secondary | ICD-10-CM

## 2021-04-21 DIAGNOSIS — O24415 Gestational diabetes mellitus in pregnancy, controlled by oral hypoglycemic drugs: Secondary | ICD-10-CM | POA: Diagnosis not present

## 2021-04-21 DIAGNOSIS — O2243 Hemorrhoids in pregnancy, third trimester: Secondary | ICD-10-CM

## 2021-04-21 LAB — POCT URINALYSIS DIPSTICK OB
Bilirubin, UA: NEGATIVE
Blood, UA: NEGATIVE
Glucose, UA: NEGATIVE
Ketones, UA: NEGATIVE
Nitrite, UA: NEGATIVE
POC,PROTEIN,UA: NEGATIVE
Spec Grav, UA: 1.015 (ref 1.010–1.025)
Urobilinogen, UA: 0.2 E.U./dL
pH, UA: 7.5 (ref 5.0–8.0)

## 2021-04-21 MED ORDER — HYDROCORTISONE (PERIANAL) 2.5 % EX CREA: TOPICAL_CREAM | Freq: Two times a day (BID) | CUTANEOUS | 2 refills | Status: DC

## 2021-04-21 NOTE — Progress Notes (Signed)
PRENATAL VISIT NOTE  Subjective:  Michele Armstrong is a 35 y.o. G3P1011 at [redacted]w[redacted]d being seen today for ongoing prenatal care.  She is currently monitored for the following issues for this high-risk pregnancy and has PTSD (post-traumatic stress disorder); Depression, major, recurrent ([place]); Hypothyroid; Prolapsed internal hemorrhoids, grade 2; Latex allergy; Stress incontinence, female; Supervision of high-risk pregnancy, third trimester; Obesity affecting pregnancy, antepartum; Adjustment disorder with mixed anxiety and depressed mood; Bipolar 1 disorder ([place]); Chronic back pain; Chronic pain of both knees; Elevated LFTs; Gastroesophageal reflux disease; Hemorrhoids; Insomnia; Large breasts; Nevus of right shoulder; Obesity, Class III, BMI 40-49.9 (morbid obesity) ([place]); Pain aggravated by breast feeding; Gestational diabetes; Pancreatitis; Nausea and vomiting during pregnancy; and IUGR (intrauterine growth restriction) affecting care of mother, third trimester on their problem list.  Patient reports  irritation from hemorrhoids, desires treatment .  Contractions: Not present. Vag. Bleeding: None.  Movement: Present. Denies leaking of fluid.   The following portions of the patient's history were reviewed and updated as appropriate: allergies, current medications, past family history, past medical history, past social history, past surgical history and problem list.   Objective:   Vitals:   04/21/21 0914 04/21/21 0920  BP: (!) 96/48 95/64  Pulse: 80 75  Weight: 227 lb (103 kg)     Fetal Status: Fetal Heart Rate (bpm): 143   Movement: Present     General:  Alert, oriented and cooperative. Patient is in no acute distress.  Skin: Skin is warm and dry. No rash noted.   Cardiovascular: Normal heart rate noted  Respiratory: Normal respiratory effort, no problems with respiration noted  Abdomen: Soft, gravid, appropriate for gestational age.  Pain/Pressure: Present     Pelvic: Large, 1 cm irritated  external hemorrhoid noted with old blood on surface.  Pelvic cultures done. Cervical exam deferred.  Exam performed in the presence of a chaperone        Extremities: Normal range of motion.  Edema: None  Mental Status: Normal mood and affect. Normal behavior. Normal judgment and thought content.   US Abdomen Complete  Result Date: 04/02/2021 CLINICAL DATA:  Pancreatitis [redacted] weeks pregnant EXAM: ABDOMEN ULTRASOUND COMPLETE COMPARISON:  MRI 01/06/2021 FINDINGS: Gallbladder: Surgically absent Common bile duct: Diameter: 5 mm Liver: No focal lesion identified. Within normal limits in parenchymal echogenicity. Portal vein is patent on color Doppler imaging with normal direction of blood flow towards the liver. IVC: No abnormality visualized. Pancreas: Poorly visible due to bowel gas. Spleen: Size and appearance within normal limits. Right Kidney: Length: 11.8 cm. Echogenicity within normal limits. No mass or hydronephrosis visualized. Left Kidney: Length: 11.7 cm. Echogenicity within normal limits. No mass or hydronephrosis visualized. Abdominal aorta: No aneurysm visualized. Other findings: None. IMPRESSION: 1. Status post cholecystectomy. 2. Poorly visible pancreas secondary to bowel gas. Electronically Signed   By: Donavan Foil M.D.   On: 04/02/2021 22:45   [place] MFM FETAL BPP W/NONSTRESS  Result Date: 04/20/2021 ----------------------------------------------------------------------  OBSTETRICS REPORT                       (Signed Final 04/20/2021 05:13 pm) ---------------------------------------------------------------------- Patient Info  ID #:       [id number]                          D.O.B.:  08/05/1986 (35 yrs)  Name:       Michele Armstrong  Visit Date: 04/20/2021 03:34 pm ---------------------------------------------------------------------- Performed By  Attending:        Tama High MD        Ref. Address:     [place]  Performed By:     Germain Osgood            [facility]                    RD[facility]                                     [facility] at                                                             [place] for                                                             Women  Referred By:      Truett Mainland                    MD ---------------------------------------------------------------------- Orders  #  Description                           Code        Ordered By  1  [place] MFM OB FOLLOW UP                   29937.16    YU FANG  2  [place] MFM UA CORD DOPPLER                76820.02    YU FANG  3  [place] MFM FETAL BPP                      [id number]  Peterson Ao     W/NONSTRESS ----------------------------------------------------------------------  #  Order #                     Accession #                Episode #  1  [id number]                   [id number]                 [id number]  2  [id number]                   [id number]                 [id number]  3  [id number]                   [id number]                 [id number] ---------------------------------------------------------------------- Indications  Gestational diabetes in pregnancy, insulin     O24.414  controlled  Obesity complicating pregnancy, third          O99.213  trimester  Pancreatitis  Advanced maternal age multigravida 35+,        O20.522  second trimester (35 yrs)  Hypothyroid                                    O99.280 E03.9  Low Risk NIPS  [redacted] weeks gestation of pregnancy                Z3A.35 ---------------------------------------------------------------------- Vital Signs                                                 Height:        5'4" ---------------------------------------------------------------------- Fetal Evaluation  Num Of Fetuses:         1  Fetal Heart Rate(bpm):  136  Cardiac Activity:        Observed  Presentation:           Cephalic  Placenta:               Anterior  P. Cord Insertion:      Previously Visualized  Amniotic Fluid  AFI FV:      Within normal limits  AFI Sum(cm)     %Tile       Largest Pocket(cm)  13.7            49          6.52  RUQ(cm)       RLQ(cm)       LUQ(cm)        LLQ(cm)  6.52          0.9           1.92           4.36 ---------------------------------------------------------------------- Biophysical Evaluation  Amniotic F.V:   Pocket => 2 cm             F. Tone:        Observed  F. Movement:    Observed                   N.S.T:  Reactive  F. Breathing:   Observed                   Score:          10/10 ---------------------------------------------------------------------- Biometry  BPD:      81.7  mm     G. Age:  32w 6d          2  %    CI:        77.87   %    70 - 86                                                          FL/HC:      20.6   %    20.1 - 22.1  HC:       293   mm     G. Age:  32w 2d        < 1  %    HC/AC:      0.97        0.93 - 1.11  AC:      300.6  mm     G. Age:  34w 0d         15  %    FL/BPD:     73.9   %    71 - 87  FL:       60.4  mm     G. Age:  31w 3d        < 1  %    FL/AC:      20.1   %    20 - 24  Est. FW:    2097  gm    4 lb 10 oz       3  % ---------------------------------------------------------------------- OB History  Gravidity:    3         Term:   1        Prem:   0        SAB:   0  TOP:          0       Ectopic:  0        Living: 1 ---------------------------------------------------------------------- Gestational Age  LMP:           35w 5d        Date:  08/13/20                 EDD:   05/20/21  U/S Today:     32w 5d                                        EDD:   06/10/21  Best:          35w 5d     Det. By:  LMP  (08/13/20)          EDD:   05/20/21 ---------------------------------------------------------------------- Anatomy  Cranium:               Appears normal         Aortic Arch:            Previously seen  Cavum:  Appears normal         Ductal Arch:            Previously seen  Ventricles:            Appears normal         Diaphragm:              Appears normal  Choroid Plexus:        Previously seen        Stomach:                Appears normal, left                                                                        sided  Cerebellum:            Previously seen        Abdomen:                Previously seen  Posterior Fossa:       Previously seen        Abdominal Wall:         Previously seen  Nuchal Fold:           Not applicable (>40    Cord Vessels:           Previously seen                         wks GA)  Face:                  Orbits and profile     Kidneys:                Previously seen                         previously seen  Lips:                  Appears normal         Bladder:                Appears normal  Thoracic:              Previously seen        Spine:                  Previously seen  Heart:                 Previously seen        Upper Extremities:      Previously seen  RVOT:                  Previously seen        Lower Extremities:      Previously seen  LVOT:                  Previously seen  Other:  Fetus previously appears to be a female. Heels previously visualized.          Open hands previously visualized. Technically difficult due to          maternal habitus and  fetal position. ---------------------------------------------------------------------- Doppler - Fetal Vessels  Umbilical Artery   S/D     %tile      RI    %tile      PI    %tile     PSV    ADFV    RDFV                                                     (cm/s)   2.08       26    0.52       24    0.64       11    27.48      No      No ---------------------------------------------------------------------- Cervix Uterus Adnexa  Cervix  Not visualized (advanced GA >24wks) ---------------------------------------------------------------------- Impression  Gestational diabetes.  Patient takes insulin NPH 25 units and  metformin 500 mg at  night.  She reports her fasting levels are  still increased 100-105 mg/DL.  Blood pressure today at her office is 125/82 mmHg.  On today's ultrasound, the estimated fetal weight is at the 3rd  percentile.  Head circumference measurement is at between -  2 and -1 SD (normal).  Amniotic fluid is normal and good fetal  activity seen.  Cephalic presentation.  Umbilical artery  Doppler showed normal forward diastolic flow.  NST is  reactive.  BPP 10/10.  I explained the finding of severe fetal growth restriction.  Ultrasound has limitations in accurately estimating fetal  weight.  Severe fetal growth restriction is associated with increased  risk of perinatal mortality and morbidity.  Given that she has suboptimal control of diabetes and severe  fetal growth restriction, I have recommend delivery at [redacted]  weeks gestation. ---------------------------------------------------------------------- Recommendations  - Patient has an appointment on 04/26/2021 for NST and  BPP at your office.  -Delivery at [redacted] weeks gestation.  -She has a prenatal visit appointment tomorrow. ----------------------------------------------------------------------                  Tama High, MD Electronically Signed Final Report   04/20/2021 05:13 pm ----------------------------------------------------------------------  [place] MFM FETAL BPP WO NON STRESS  Result Date: 04/07/2021 ----------------------------------------------------------------------  OBSTETRICS REPORT                       (Signed Final 04/07/2021 06:27 pm) ---------------------------------------------------------------------- Patient Info  ID #:       [id number]                          D.O.B.:  [birth year] (35 yrs)  Name:       Michele Armstrong                  Visit Date: 04/07/2021 12:48 pm ---------------------------------------------------------------------- Performed By  Attending:        Tama High MD        Ref. Address:     [address]  [place]                                                             [id number]  Performed By:     Vance Peper BS,      Location[facility]                    [facility], RVT                                [facility] at                                                             [place] for                                                             Women  Referred By:      Truett Mainland                    MD ---------------------------------------------------------------------- Orders  #  Description                           Code        Ordered By  1  [place] MFM FETAL BPP WO NON               76819.01    YU FANG     STRESS ----------------------------------------------------------------------  #  Order #                     Accession #                Episode #  1  [id number]                   [id number]                 [id number] ---------------------------------------------------------------------- Indications  Gestational diabetes in pregnancy,             O24.415  controlled by oral hypoglycemic drugs  Pancreatitis  [redacted] weeks gestation of pregnancy                Z3A.33 ---------------------------------------------------------------------- Vital Signs                                                 Height:        5'4" ---------------------------------------------------------------------- Fetal Evaluation  Num Of Fetuses:         1  Fetal Heart Rate(bpm):  137  Cardiac Activity:  Observed  Presentation:           Cephalic  Placenta:               Anterior  P. Cord Insertion:      Previously Visualized  Amniotic Fluid  AFI FV:      Within normal limits  AFI Sum(cm)     %Tile       Largest Pocket(cm)  15.1            54          5.  RUQ(cm)       RLQ(cm)       LUQ(cm)        LLQ(cm)  3.9           5             2.5            3.7 ---------------------------------------------------------------------- Biophysical  Evaluation  Amniotic F.V:   Pocket => 2 cm             F. Tone:        Observed  F. Movement:    Observed                   Score:          8/8  F. Breathing:   Observed ---------------------------------------------------------------------- Biometry  LV:        2.8  mm ---------------------------------------------------------------------- OB History  Gravidity:    3         Term:   1        Prem:   0        SAB:   0  TOP:          0       Ectopic:  0        Living: 1 ---------------------------------------------------------------------- Gestational Age  LMP:           33w 6d        Date:  08/13/20                 EDD:   05/20/21  Best:          33w 6d     Det. By:  LMP  (08/13/20)          EDD:   05/20/21 ---------------------------------------------------------------------- Anatomy  Ventricles:            Appears normal         Kidneys:                Appear normal  Heart:                 Appears normal         Bladder:                Appears normal                         (4CH, axis, and                         situs)  Stomach:               Appears normal, left                         sided ---------------------------------------------------------------------- Cervix Uterus Adnexa  Cervix  Not visualized (advanced  GA >24wks) ---------------------------------------------------------------------- Impression  Amniotic fluid is normal and good fetal activity is seen  .Antenatal testing is reassuring. BPP 8/8. ---------------------------------------------------------------------- Recommendations  - Fetal growth assessment in 2 weeks.  -Continue weekly BPP till delivery. ----------------------------------------------------------------------                  Tama High, MD Electronically Signed Final Report   04/07/2021 06:27 pm ----------------------------------------------------------------------  [place] MFM OB FOLLOW UP  Result Date: 04/20/2021 ----------------------------------------------------------------------   OBSTETRICS REPORT                       (Signed Final 04/20/2021 05:13 pm) ---------------------------------------------------------------------- Patient Info  ID #:       [id number]                          D.O.B.:  08-12-1986 (35 yrs)  Name:       Michele Armstrong                  Visit Date: 04/20/2021 03:34 pm ---------------------------------------------------------------------- Performed By  Attending:        Tama High MD        Ref. Address:     [place]                                                             [address]  Performed By:     Germain Osgood            Location[facility]                    RD[facility]                                     [facility] at                                                             [place] for  [facility]  Referred By:      Truett Mainland                    MD ---------------------------------------------------------------------- Orders  #  Description                           Code        Ordered By  1  [place] MFM OB FOLLOW UP                   76816.01    YU FANG  2  [place] MFM UA CORD DOPPLER                76820.02    YU FANG  3  [place] MFM FETAL BPP                      [id number]     YU FANG     W/NONSTRESS ----------------------------------------------------------------------  #  Order #                     Accession #                Episode #  1  [id number]                   [id number]                 [id number]  2  [id number]                   [id number]                 [id number]  3  [id number]                   [id number]                 [id number] ---------------------------------------------------------------------- Indications  Gestational diabetes in pregnancy, insulin     O24.414  controlled  Obesity complicating pregnancy, third          O99.213  trimester  Pancreatitis   Advanced maternal age multigravida 57+,        O75.522  second trimester (29 yrs)  Hypothyroid                                    O99.280 E03.9  Low Risk NIPS  [redacted] weeks gestation of pregnancy                Z3A.35 ---------------------------------------------------------------------- Vital Signs                                                 Height:        5'4" ---------------------------------------------------------------------- Fetal Evaluation  Num Of Fetuses:         1  Fetal Heart Rate(bpm):  136  Cardiac Activity:       Observed  Presentation:           Cephalic  Placenta:               Anterior  P. Cord Insertion:      Previously Visualized  Amniotic Fluid  AFI FV:  Within normal limits  AFI Sum(cm)     %Tile       Largest Pocket(cm)  13.7            49          6.52  RUQ(cm)       RLQ(cm)       LUQ(cm)        LLQ(cm)  6.52          0.9           1.92           4.36 ---------------------------------------------------------------------- Biophysical Evaluation  Amniotic F.V:   Pocket => 2 cm             F. Tone:        Observed  F. Movement:    Observed                   N.S.T:          Reactive  F. Breathing:   Observed                   Score:          10/10 ---------------------------------------------------------------------- Biometry  BPD:      81.7  mm     G. Age:  32w 6d          2  %    CI:        77.87   %    70 - 86                                                          FL/HC:      20.6   %    20.1 - 22.1  HC:       293   mm     G. Age:  32w 2d        < 1  %    HC/AC:      0.97        0.93 - 1.11  AC:      300.6  mm     G. Age:  34w 0d         15  %    FL/BPD:     73.9   %    71 - 87  FL:       60.4  mm     G. Age:  31w 3d        < 1  %    FL/AC:      20.1   %    20 - 24  Est. FW:    2097  gm    4 lb 10 oz       3  % ---------------------------------------------------------------------- OB History  Gravidity:    3         Term:   1        Prem:   0        SAB:   0  TOP:          0        Ectopic:  0        Living: 1 ---------------------------------------------------------------------- Gestational Age  LMP:           35w 5d  Date:  08/13/20                 EDD:   05/20/21  U/S Today:     32w 5d                                        EDD:   06/10/21  Best:          35w 5d     Det. By:  LMP  (08/13/20)          EDD:   05/20/21 ---------------------------------------------------------------------- Anatomy  Cranium:               Appears normal         Aortic Arch:            Previously seen  Cavum:                 Appears normal         Ductal Arch:            Previously seen  Ventricles:            Appears normal         Diaphragm:              Appears normal  Choroid Plexus:        Previously seen        Stomach:                Appears normal, left                                                                        sided  Cerebellum:            Previously seen        Abdomen:                Previously seen  Posterior Fossa:       Previously seen        Abdominal Wall:         Previously seen  Nuchal Fold:           Not applicable (>97    Cord Vessels:           Previously seen                         wks GA)  Face:                  Orbits and profile     Kidneys:                Previously seen                         previously seen  Lips:                  Appears normal         Bladder:                Appears normal  Thoracic:  Previously seen        Spine:                  Previously seen  Heart:                 Previously seen        Upper Extremities:      Previously seen  RVOT:                  Previously seen        Lower Extremities:      Previously seen  LVOT:                  Previously seen  Other:  Fetus previously appears to be a female. Heels previously visualized.          Open hands previously visualized. Technically difficult due to          maternal habitus and fetal position. ---------------------------------------------------------------------- Doppler - Fetal  Vessels  Umbilical Artery   S/D     %tile      RI    %tile      PI    %tile     PSV    ADFV    RDFV                                                     (cm/s)   2.08       26    0.52       24    0.64       11    27.48      No      No ---------------------------------------------------------------------- Cervix Uterus Adnexa  Cervix  Not visualized (advanced GA >24wks) ---------------------------------------------------------------------- Impression  Gestational diabetes.  Patient takes insulin NPH 25 units and  metformin 500 mg at night.  She reports her fasting levels are  still increased 100-105 mg/DL.  Blood pressure today at her office is 125/82 mmHg.  On today's ultrasound, the estimated fetal weight is at the 3rd  percentile.  Head circumference measurement is at between -  2 and -1 SD (normal).  Amniotic fluid is normal and good fetal  activity seen.  Cephalic presentation.  Umbilical artery  Doppler showed normal forward diastolic flow.  NST is  reactive.  BPP 10/10.  I explained the finding of severe fetal growth restriction.  Ultrasound has limitations in accurately estimating fetal  weight.  Severe fetal growth restriction is associated with increased  risk of perinatal mortality and morbidity.  Given that she has suboptimal control of diabetes and severe  fetal growth restriction, I have recommend delivery at [redacted]  weeks gestation. ---------------------------------------------------------------------- Recommendations  - Patient has an appointment on 04/26/2021 for NST and  BPP at your office.  -Delivery at [redacted] weeks gestation.  -She has a prenatal visit appointment tomorrow. ----------------------------------------------------------------------                  Tama High, MD Electronically Signed Final Report   04/20/2021 05:13 pm ----------------------------------------------------------------------  [place] MFM OB FOLLOW UP  Result Date:  03/23/2021 ----------------------------------------------------------------------  OBSTETRICS REPORT                       (Signed Final 03/23/2021 05:28 pm) ---------------------------------------------------------------------- Patient Info  ID #:       [id number]                          D.O.B.:  [birth year] (35 yrs)  Name:       Michele Armstrong                  Visit Date: 03/23/2021 03:38 pm ---------------------------------------------------------------------- Performed By  Attending:        Johnell Comings MD         Ref. Address:     [place]                                                             [id number]  Performed By:     Lacretia Leigh       [facility]                    RDMS                                     [facility] at                                                             [place] for                                                             Women  Referred By:      Truett Mainland                    MD ---------------------------------------------------------------------- Orders  #  Description                           Code        Ordered By  1  [place] MFM OB FOLLOW UP                   12458.09    YU FANG ----------------------------------------------------------------------  #  Order #                     Accession #                Episode #  1  [id number]                   [id number]                 [id number] ---------------------------------------------------------------------- Indications  Gestational diabetes in pregnancy, diet        O24.410  controlled  Advanced maternal age multigravida 62+,        O31.522  second trimester (59 yrs)  Obesity complicating pregnancy, third          O9.213  trimester  [redacted] weeks gestation of pregnancy                Z3A.31  Hypothyroid                                    O99.280 E03.9   Encounter for other antenatal screening        Z36.2  follow-up  Low Risk NIPS ---------------------------------------------------------------------- Vital Signs                                                 Height:        5'4" ---------------------------------------------------------------------- Fetal Evaluation  Num Of Fetuses:         1  Preg. Location:         Intrauterine  Fetal Heart Rate(bpm):  145  Cardiac Activity:       Observed  Presentation:           Breech  Placenta:               Anterior  P. Cord Insertion:      Previously Visualized  Amniotic Fluid  AFI FV:      Within normal limits  AFI Sum(cm)     %Tile       Largest Pocket(cm)  14.55           51          5.18  RUQ(cm)       RLQ(cm)       LUQ(cm)        LLQ(cm)  5.18          4.4           0              4.97 ---------------------------------------------------------------------- Biometry  BPD:      74.1  mm     G. Age:  29w 5d        2.9  %    CI:        72.01   %    70 - 86                                                          FL/HC:      20.1   %    19.1 -  21.3  HC:      277.9  mm     G. Age:  30w 3d        1.9  %    HC/AC:      0.99        0.96 - 1.17  AC:      279.5  mm     G. Age:  32w 0d         57  %    FL/BPD:     75.3   %    71 - 87  FL:       55.8  mm     G. Age:  29w 3d        1.7  %    FL/AC:      20.0   %    20 - 24  HUM:      53.4  mm     G. Age:  31w 1d         38  %  LV:        2.8  mm  Est. FW:    1651  gm    3 lb 10 oz      16  % ---------------------------------------------------------------------- OB History  Gravidity:    3         Term:   1        Prem:   0        SAB:   0  TOP:          0       Ectopic:  0        Living: 1 ---------------------------------------------------------------------- Gestational Age  LMP:           31w 5d        Date:  08/13/20                 EDD:   05/20/21  U/S Today:     30w 3d                                        EDD:   05/29/21  Best:          31w 5d     Det. By:  LMP  (08/13/20)           EDD:   05/20/21 ---------------------------------------------------------------------- Anatomy  Cranium:               Appears normal         Aortic Arch:            Previously seen  Cavum:                 Appears normal         Ductal Arch:            Previously seen  Ventricles:            Appears normal         Diaphragm:              Appears normal  Choroid Plexus:        Previously seen        Stomach:                Appears normal, left  sided  Cerebellum:            Previously seen        Abdomen:                Previously seen  Posterior Fossa:       Previously seen        Abdominal Wall:         Previously seen  Nuchal Fold:           Not applicable (>37    Cord Vessels:           Previously seen                         wks GA)  Face:                  Orbits and profile     Kidneys:                Appear normal                         previously seen  Lips:                  Appears normal         Bladder:                Appears normal  Thoracic:              Appears normal         Spine:                  Previously seen  Heart:                 Appears normal         Upper Extremities:      Previously seen                         (4CH, axis, and                         situs)  RVOT:                  Previously seen        Lower Extremities:      Previously seen  LVOT:                  Previously seen  Other:  Fetus previously appears to be a female. Heels previously visualized.          Open hands previously visualized. Technically difficult due to          maternal habitus and fetal position. ---------------------------------------------------------------------- Cervix Uterus Adnexa  Cervix  Not visualized (advanced GA >24wks)  Uterus  No abnormality visualized.  Right Ovary  Not visualized.  Left Ovary  Not visualized.  Cul De Sac  No free fluid seen.  Adnexa  No abnormality visualized.  ---------------------------------------------------------------------- Comments  This patient was seen for a follow up growth scan due to  gestational diabetes treated with metformin and advanced  maternal age.  She denies any problems since her last exam.  She was informed that the fetal growth and amniotic fluid  level appears appropriate for her gestational age.  Due to gestational diabetes treated with metformin, we will  start weekly biophysical profiles at 78  weeks.  A biophysical profile was scheduled in 1 week. ----------------------------------------------------------------------                   Johnell Comings, MD Electronically Signed Final Report   03/23/2021 05:28 pm ----------------------------------------------------------------------  [place] MFM UA CORD DOPPLER  Result Date: 04/20/2021 ----------------------------------------------------------------------  OBSTETRICS REPORT                       (Signed Final 04/20/2021 05:13 pm) ---------------------------------------------------------------------- Patient Info  ID #:       [id number]                          D.O.B.:  1986/04/30 (35 yrs)  Name:       Michele Armstrong                  Visit Date: 04/20/2021 03:34 pm ---------------------------------------------------------------------- Performed By  Attending:        Tama High MD        Ref. Address:     [place]                                                             [id number]  Performed By:     Germain Osgood            [facility]                                     [facility]                                                             [facility]                                                             [facility]  Referred By:      Truett Mainland  MD  ---------------------------------------------------------------------- Orders  #  Description                           Code        Ordered By  1  [place] MFM OB FOLLOW UP                   76816.01    YU FANG  2  [place] MFM UA CORD DOPPLER                76820.02    YU FANG  3  [place] MFM FETAL BPP                      76818.5     YU FANG     W/NONSTRESS ----------------------------------------------------------------------  #  Order #                     Accession #                Episode #  1  [id number]                   [id number]                 [id number]  2  [id number]                   [id number]                 [id number]  3  [id number]                   [id number]                 [id number] ---------------------------------------------------------------------- Indications  Gestational diabetes in pregnancy, insulin     O24.414  controlled  Obesity complicating pregnancy, third          O99.213  trimester  Pancreatitis  Advanced maternal age multigravida 39+,        O56.522  second trimester (58 yrs)  Hypothyroid                                    O99.280 E03.9  Low Risk NIPS  [redacted] weeks gestation of pregnancy                Z3A.35 ---------------------------------------------------------------------- Vital Signs                                                 Height:        5'4" ---------------------------------------------------------------------- Fetal Evaluation  Num Of Fetuses:         1  Fetal Heart Rate(bpm):  136  Cardiac Activity:       Observed  Presentation:           Cephalic  Placenta:               Anterior  P. Cord Insertion:      Previously Visualized  Amniotic Fluid  AFI FV:      Within normal limits  AFI Sum(cm)     %Tile       Largest Pocket(cm)  13.7  49          6.52  RUQ(cm)       RLQ(cm)       LUQ(cm)        LLQ(cm)  6.52          0.9           1.92           4.36 ---------------------------------------------------------------------- Biophysical Evaluation  Amniotic F.V:   Pocket => 2 cm              F. Tone:        Observed  F. Movement:    Observed                   N.S.T:          Reactive  F. Breathing:   Observed                   Score:          10/10 ---------------------------------------------------------------------- Biometry  BPD:      81.7  mm     G. Age:  32w 6d          2  %    CI:        77.87   %    70 - 86                                                          FL/HC:      20.6   %    20.1 - 22.1  HC:       293   mm     G. Age:  32w 2d        < 1  %    HC/AC:      0.97        0.93 - 1.11  AC:      300.6  mm     G. Age:  34w 0d         15  %    FL/BPD:     73.9   %    71 - 87  FL:       60.4  mm     G. Age:  31w 3d        < 1  %    FL/AC:      20.1   %    20 - 24  Est. FW:    2097  gm    4 lb 10 oz       3  % ---------------------------------------------------------------------- OB History  Gravidity:    3         Term:   1        Prem:   0        SAB:   0  TOP:          0       Ectopic:  0        Living: 1 ---------------------------------------------------------------------- Gestational Age  LMP:           35w 5d        Date:  08/13/20                 EDD:   05/20/21  U/S Today:  32w 5d                                        EDD:   06/10/21  Best:          35w 5d     Det. By:  LMP  (08/13/20)          EDD:   05/20/21 ---------------------------------------------------------------------- Anatomy  Cranium:               Appears normal         Aortic Arch:            Previously seen  Cavum:                 Appears normal         Ductal Arch:            Previously seen  Ventricles:            Appears normal         Diaphragm:              Appears normal  Choroid Plexus:        Previously seen        Stomach:                Appears normal, left                                                                        sided  Cerebellum:            Previously seen        Abdomen:                Previously seen  Posterior Fossa:       Previously seen        Abdominal Wall:         Previously seen   Nuchal Fold:           Not applicable (>16    Cord Vessels:           Previously seen                         wks GA)  Face:                  Orbits and profile     Kidneys:                Previously seen                         previously seen  Lips:                  Appears normal         Bladder:                Appears normal  Thoracic:              Previously seen        Spine:  Previously seen  Heart:                 Previously seen        Upper Extremities:      Previously seen  RVOT:                  Previously seen        Lower Extremities:      Previously seen  LVOT:                  Previously seen  Other:  Fetus previously appears to be a female. Heels previously visualized.          Open hands previously visualized. Technically difficult due to          maternal habitus and fetal position. ---------------------------------------------------------------------- Doppler - Fetal Vessels  Umbilical Artery   S/D     %tile      RI    %tile      PI    %tile     PSV    ADFV    RDFV                                                     (cm/s)   2.08       26    0.52       24    0.64       11    27.48      No      No ---------------------------------------------------------------------- Cervix Uterus Adnexa  Cervix  Not visualized (advanced GA >24wks) ---------------------------------------------------------------------- Impression  Gestational diabetes.  Patient takes insulin NPH 25 units and  metformin 500 mg at night.  She reports her fasting levels are  still increased 100-105 mg/DL.  Blood pressure today at her office is 125/82 mmHg.  On today's ultrasound, the estimated fetal weight is at the 3rd  percentile.  Head circumference measurement is at between -  2 and -1 SD (normal).  Amniotic fluid is normal and good fetal  activity seen.  Cephalic presentation.  Umbilical artery  Doppler showed normal forward diastolic flow.  NST is  reactive.  BPP 10/10.  I explained the finding of severe fetal  growth restriction.  Ultrasound has limitations in accurately estimating fetal  weight.  Severe fetal growth restriction is associated with increased  risk of perinatal mortality and morbidity.  Given that she has suboptimal control of diabetes and severe  fetal growth restriction, I have recommend delivery at [redacted]  weeks gestation. ---------------------------------------------------------------------- Recommendations  - Patient has an appointment on 04/26/2021 for NST and  BPP at your office.  -Delivery at [redacted] weeks gestation.  -She has a prenatal visit appointment tomorrow. ----------------------------------------------------------------------                  Tama High, MD Electronically Signed Final Report   04/20/2021 05:13 pm ----------------------------------------------------------------------  US FETAL BPP W/NONSTRESS  Result Date: 04/14/2021 ----------------------------------------------------------------------  OBSTETRICS REPORT                       (Signed Final 04/14/2021 08:38 am) ---------------------------------------------------------------------- Patient Info  ID #:       [id number]  D.O.B.:  1985/11/18 (35 yrs)  Name:       Michele Armstrong                  Visit Date: 04/12/2021 05:22 pm ---------------------------------------------------------------------- Performed By  Attending:        Arlina Robes MD       Ref. Address:     [place]  Performed By:     Shauna Hugh Day RNC          [facility]         [facility]                                                             [place] [facility]                                                              [facility]  Referred By:      Truett Mainland                    MD ---------------------------------------------------------------------- Orders  #  Description                           Code        Ordered By  1  US FETAL BPP W/NONSTRESS              09470.9     Arlina Robes ----------------------------------------------------------------------  #  Order #                     Accession #                Episode #  1  [id number]                   [id number]                 [id number] ---------------------------------------------------------------------- Service(s) Provided  US Fetal BPP W NST                                    [phone] ---------------------------------------------------------------------- Indications  [redacted] weeks gestation of pregnancy                Z3A.34  Gestational diabetes in pregnancy,             O24.415  controlled by oral hypoglycemic drugs  Obesity complicating pregnancy, third          O99.213  trimester ---------------------------------------------------------------------- Vital Signs                                                 Height:        5'4" ---------------------------------------------------------------------- Fetal Evaluation  Num Of Fetuses:         1  Preg. Location:         Intrauterine  Cardiac Activity:       Observed  Presentation:           Cephalic  Amniotic Fluid  AFI FV:      Within normal limits  AFI Sum(cm)     %Tile       Largest Pocket(cm)  14.06           50          5.16  RUQ(cm)       RLQ(cm)       LUQ(cm)        LLQ(cm)  3.87          1.83          5.16           3.2 ---------------------------------------------------------------------- Biophysical Evaluation  Amniotic F.V:   Pocket => 2 cm             F. Tone:        Observed  F. Movement:    Observed                   N.S.T:          Reactive  F. Breathing:   Observed                   Score:          10/10 ---------------------------------------------------------------------- OB History  Gravidity:    3          Term:   1        Prem:   0        SAB:   0  TOP:  0       Ectopic:  0        Living: 1 ---------------------------------------------------------------------- Gestational Age  LMP:           34w 4d        Date:  08/13/20                 EDD:   05/20/21  Best:          34w 4d     Det. By:  LMP  (08/13/20)          EDD:   05/20/21 ---------------------------------------------------------------------- Impression  BPP 10/10  Vertex ---------------------------------------------------------------------- Recommendations  Continue with weekly antenatal testing as clinically indicated ----------------------------------------------------------------------                  Arlina Robes, MD Electronically Signed Final Report   04/14/2021 08:38 am ----------------------------------------------------------------------  US FETAL BPP W/NONSTRESS  Result Date: 04/06/2021 ----------------------------------------------------------------------  OBSTETRICS REPORT                       (Signed Final 04/06/2021 08:27 am) ---------------------------------------------------------------------- Patient Info  ID #:       [id number]                          D.O.B.:  11/08/1985 (35 yrs)  Name:       Michele Armstrong                  Visit Date: 03/30/2021 04:21 pm ---------------------------------------------------------------------- Performed By  Attending:        Clayton Lefort MD     Ref. Address:     [place]  Performed By:     Shauna Hugh Day RNC          Location[facility]         [facility]                                                             [facility]                                                             [employer] for                                                              Women  Referred By:      Truett Mainland                    MD ---------------------------------------------------------------------- Orders  #  Description                           Code        Ordered By  1  US FETAL BPP W/NONSTRESS              17510.2     Clayton Lefort ----------------------------------------------------------------------  #  Order #                     Accession #                Episode #  1  [id number]                   [id number]                 [id number] ---------------------------------------------------------------------- Service(s) Provided  US Fetal BPP W NST                                    [phone] ---------------------------------------------------------------------- Indications  [redacted] weeks gestation of pregnancy                Z3A.32  Gestational diabetes in pregnancy,             O24.415  controlled by oral hypoglycemic drugs ---------------------------------------------------------------------- Vital Signs                                                 Height:        5'4" ---------------------------------------------------------------------- Fetal Evaluation  Num Of Fetuses:         1  Preg. Location:         Intrauterine  Cardiac Activity:       Observed  Presentation:           Transverse, head to maternal right  Amniotic Fluid  AFI FV:      Within normal limits  AFI Sum(cm)     %Tile       Largest Pocket(cm)  14.97           53          5.48  RUQ(cm)       RLQ(cm)       LUQ(cm)        LLQ(cm)  4.92  2.31          2.26           5.48 ---------------------------------------------------------------------- Biophysical Evaluation  Amniotic F.V:   Pocket => 2 cm             F. Tone:        Observed  F. Movement:    Observed                   N.S.T:          Reactive  F. Breathing:   Observed                   Score:          10/10 ---------------------------------------------------------------------- OB History  Gravidity:    3         Term:   1         Prem:   0        SAB:   0  TOP:          0       Ectopic:  0        Living: 1 ---------------------------------------------------------------------- Gestational Age  LMP:           32w 5d        Date:  08/13/20                 EDD:   05/20/21  Best:          Milderd Meager 5d     Det. By:  LMP  (08/13/20)          EDD:   05/20/21 ---------------------------------------------------------------------- Impression  Antenatal testing due to gestational diabetes on metformin.  Testing is reassuring, BPP 10/10. ---------------------------------------------------------------------- Recommendations  Continue weekly antenatal testing till delivery . ----------------------------------------------------------------------                Clayton Lefort, MD Electronically Signed Final Report   04/06/2021 08:27 am ----------------------------------------------------------------------    Assessment and Plan:  Pregnancy: [id number] at [redacted]w[redacted]d 1. IUGR (intrauterine growth restriction) affecting care of mother, third trimester IOL scheduled at 25 weeks as recommended, scheduled 04/29/21 morning   Risks and benefits of induction were reviewed, including failure of method, prolonged labor, need for further intervention, risk of cesarean section.  All questions answered.  Orders have been signed and held. She was told to expect a call from [facility] L&D with further instructions about her pre-admission COVID screening and any further instructions about the induction of labor. She was told she will called in during the morning between 6:30am -9 am to come in for her induction as soon as the rooms and staff are ready for them.   2. Insulin controlled gestational diabetes mellitus (GDM) in third trimester Reviewed CBG.  Last few values are within range, no need for insulin change for now. Continue antenatal testing as per MFM, delivery next week.  3. Hemorrhoids during pregnancy in third trimester Anusol prescribed. - hydrocortisone (ANUSOL-HC)  2.5 % rectal cream; Place rectally 2 (two) times daily.  Dispense: 30 g; Refill: 2  4. [redacted] weeks gestation of pregnancy 5. Supervision of high risk pregnancy in third trimester Pelvic cultures done today, will follow up results and manage accordingly. - GC/Chlamydia probe amp ([place])not at [facility] - Culture, beta strep (group b only)  Preterm labor symptoms and general obstetric precautions including but not limited to vaginal bleeding, contractions, leaking of fluid and fetal movement were reviewed in detail with  the patient. Please refer to After Visit Summary for other counseling recommendations.   Return for Postpartum check (scheduled for IOLon 7/28 morning).  Future Appointments  Date Time Provider [place]  04/26/2021  3:15 PM [facility] NST [facility]  04/29/2021  8:10 AM [facility] SCHED ROOM [facility] None  06/03/2021  3:10 PM Stinson, Tanna Savoy, DO [facility] None    Verita Schneiders, MD

## 2021-04-21 NOTE — Patient Instructions (Signed)
Return to office for any scheduled appointments. Call the office or go to the MAU at [place] at [facility] if: You begin to have strong, frequent contractions Your water breaks.  Sometimes it is a big gush of fluid, sometimes it is just a trickle that keeps getting your panties wet or running down your legs You have vaginal bleeding.  It is normal to have a small amount of spotting if your cervix was checked.  You do not feel your baby moving like normal.  If you do not, get something to eat and drink and lay down and focus on feeling your baby move.   If your baby is still not moving like normal, you should call the office or go to MAU. Any other obstetric concerns. Labor Induction Labor induction is when steps are taken to cause a pregnant woman to begin the labor process. Most women go into labor on their own between 37 weeks and 42 weeks of pregnancy. When this does not happen, or when there is a medical needfor labor to begin, steps may be taken to induce, or bring on, labor. Labor induction causes a pregnant woman's uterus to contract. It also causes the cervix to soften (ripen), open (dilate), and thin out. Usually, labor is not induced before 39 weeks of pregnancyunless there is a medical reason to do so. When is labor induction considered? Labor induction may be right for you if: Your pregnancy lasts longer than 41 to 42 weeks. Your placenta is separating from your uterus (placental abruption). You have a rupture of membranes and your labor does not begin. You have health problems, like diabetes or high blood pressure (preeclampsia) during your pregnancy. Your baby has stopped growing or does not have enough amniotic fluid. Before labor induction begins, your health care provider will consider the following factors: Your medical condition and the baby's condition. How many weeks you have been pregnant. How mature the baby's lungs are. The condition of your  cervix. The position of the baby. The size of your birth canal. Tell a health care provider about: Any allergies you have. All medicines you are taking, including vitamins, herbs, eye drops, creams, and over-the-counter medicines. Any problems you or your family members have had with anesthetic medicines. Any surgeries you have had. Any blood disorders you have. Any medical conditions you have. What are the risks? Generally, this is a safe procedure. However, problems may occur, including: Failed induction. Changes in fetal heart rate, such as being too high, too low, or irregular (erratic). Infection in the mother or the baby. Increased risk of having a cesarean delivery. Breaking off (abruption) of the placenta from the uterus. This is rare. Rupture of the uterus. This is very rare. Your baby could fail to get enough blood flow or oxygen. This can be life-threatening. When induction is needed for medical reasons, the benefits generally outweighthe risks. What happens during the procedure? During the procedure, your health care provider will use one of these methods to induce labor: Stripping the membranes. In this method, the amniotic sac tissue is gently separated from the cervix. This causes the following to happen: Your cervix stretches, which in turn causes the release of prostaglandins. Prostaglandins induce labor and cause the uterus to contract. This procedure is often done in an office visit. You will be sent home to wait for contractions to begin. Prostaglandin medicine. This medicine starts contractions and causes the cervix to dilate and ripen. This can be taken by  mouth (orally) or by being inserted into the vagina (suppository). Inserting a small, thin tube (catheter) with a balloon into the vagina and then expanding the balloon with water to dilate the cervix. Breaking the water. In this method, a small instrument is used to make a small hole in the amniotic sac. This  eventually causes the amniotic sac to break. Contractions should begin within a few hours. Medicine to trigger or strengthen contractions. This medicine is given through an IV that is inserted into a vein in your arm. This procedure may vary among health care providers and hospitals. Where to find more information March of Dimes: www.marchofdimes.org [employer] of Obstetricians and Gynecologists: www.acog.org Summary Labor induction causes a pregnant woman's uterus to contract. It also causes the cervix to soften (ripen), open (dilate), and thin out. Labor is usually not induced before 39 weeks of pregnancy unless there is a medical reason to do so. When induction is needed for medical reasons, the benefits generally outweigh the risks. Talk with your health care provider about which methods of labor induction are right for you. This information is not intended to replace advice given to you by your health care provider. Make sure you discuss any questions you have with your healthcare provider. Document Revised: 07/02/2020 Document Reviewed: 07/02/2020 Elsevier Patient Education  [place].

## 2021-04-22 ENCOUNTER — Encounter (HOSPITAL_COMMUNITY): Payer: Self-pay | Admitting: *Deleted

## 2021-04-22 ENCOUNTER — Other Ambulatory Visit: Payer: Self-pay | Admitting: Advanced Practice Midwife

## 2021-04-22 ENCOUNTER — Telehealth (HOSPITAL_COMMUNITY): Payer: Self-pay | Admitting: *Deleted

## 2021-04-22 LAB — GC/CHLAMYDIA PROBE AMP (~~LOC~~) NOT AT ARMC
Chlamydia: NEGATIVE
Comment: NEGATIVE
Comment: NORMAL
Neisseria Gonorrhea: NEGATIVE

## 2021-04-22 NOTE — Telephone Encounter (Signed)
Preadmission screen  

## 2021-04-25 LAB — CULTURE, BETA STREP (GROUP B ONLY): Strep Gp B Culture: NEGATIVE

## 2021-04-26 ENCOUNTER — Ambulatory Visit: Payer: Medicaid Other | Admitting: *Deleted

## 2021-04-26 ENCOUNTER — Other Ambulatory Visit: Payer: Self-pay

## 2021-04-26 ENCOUNTER — Ambulatory Visit (INDEPENDENT_AMBULATORY_CARE_PROVIDER_SITE_OTHER): Payer: Medicaid Other

## 2021-04-26 VITALS — BP 117/72 | HR 98

## 2021-04-26 DIAGNOSIS — O365930 Maternal care for other known or suspected poor fetal growth, third trimester, not applicable or unspecified: Secondary | ICD-10-CM | POA: Diagnosis not present

## 2021-04-26 DIAGNOSIS — Z3A36 36 weeks gestation of pregnancy: Secondary | ICD-10-CM

## 2021-04-26 DIAGNOSIS — O24414 Gestational diabetes mellitus in pregnancy, insulin controlled: Secondary | ICD-10-CM

## 2021-04-26 NOTE — Progress Notes (Signed)

## 2021-04-27 ENCOUNTER — Other Ambulatory Visit (HOSPITAL_COMMUNITY)
Admission: RE | Admit: 2021-04-27 | Discharge: 2021-04-27 | Disposition: A | Discharge status: Home / Self Care | Payer: Medicaid Other | Source: Ambulatory Visit | Attending: Obstetrics & Gynecology | Admitting: Obstetrics & Gynecology

## 2021-04-27 DIAGNOSIS — Z01812 Encounter for preprocedural laboratory examination: Secondary | ICD-10-CM | POA: Insufficient documentation

## 2021-04-27 DIAGNOSIS — Z20822 Contact with and (suspected) exposure to covid-19: Secondary | ICD-10-CM | POA: Diagnosis not present

## 2021-04-27 LAB — SARS CORONAVIRUS 2 (TAT 6-24 HRS): SARS Coronavirus 2: NEGATIVE

## 2021-04-29 ENCOUNTER — Encounter (HOSPITAL_COMMUNITY): Payer: Self-pay | Admitting: Obstetrics and Gynecology

## 2021-04-29 ENCOUNTER — Inpatient Hospital Stay (HOSPITAL_COMMUNITY)
Admission: AD | Admit: 2021-04-29 | Discharge: 2021-05-02 | DRG: 807 | Disposition: A | Discharge status: Home / Self Care | Payer: Medicaid Other | Attending: Obstetrics and Gynecology | Admitting: Obstetrics and Gynecology

## 2021-04-29 ENCOUNTER — Inpatient Hospital Stay (HOSPITAL_COMMUNITY): Payer: Medicaid Other

## 2021-04-29 ENCOUNTER — Other Ambulatory Visit: Payer: Self-pay

## 2021-04-29 ENCOUNTER — Encounter: Payer: Medicaid Other | Admitting: Obstetrics and Gynecology

## 2021-04-29 DIAGNOSIS — O24424 Gestational diabetes mellitus in childbirth, insulin controlled: Principal | ICD-10-CM | POA: Diagnosis present

## 2021-04-29 DIAGNOSIS — Z30430 Encounter for insertion of intrauterine contraceptive device: Secondary | ICD-10-CM | POA: Diagnosis not present

## 2021-04-29 DIAGNOSIS — R030 Elevated blood-pressure reading, without diagnosis of hypertension: Secondary | ICD-10-CM | POA: Diagnosis present

## 2021-04-29 DIAGNOSIS — F4310 Post-traumatic stress disorder, unspecified: Secondary | ICD-10-CM | POA: Diagnosis present

## 2021-04-29 DIAGNOSIS — Z87891 Personal history of nicotine dependence: Secondary | ICD-10-CM | POA: Diagnosis not present

## 2021-04-29 DIAGNOSIS — Z3A37 37 weeks gestation of pregnancy: Secondary | ICD-10-CM

## 2021-04-29 DIAGNOSIS — O99344 Other mental disorders complicating childbirth: Secondary | ICD-10-CM | POA: Diagnosis present

## 2021-04-29 DIAGNOSIS — O24419 Gestational diabetes mellitus in pregnancy, unspecified control: Secondary | ICD-10-CM | POA: Diagnosis present

## 2021-04-29 DIAGNOSIS — E039 Hypothyroidism, unspecified: Secondary | ICD-10-CM | POA: Diagnosis present

## 2021-04-29 DIAGNOSIS — O365930 Maternal care for other known or suspected poor fetal growth, third trimester, not applicable or unspecified: Secondary | ICD-10-CM | POA: Diagnosis present

## 2021-04-29 DIAGNOSIS — O99284 Endocrine, nutritional and metabolic diseases complicating childbirth: Secondary | ICD-10-CM | POA: Diagnosis present

## 2021-04-29 DIAGNOSIS — O24425 Gestational diabetes mellitus in childbirth, controlled by oral hypoglycemic drugs: Secondary | ICD-10-CM | POA: Diagnosis not present

## 2021-04-29 DIAGNOSIS — O0993 Supervision of high risk pregnancy, unspecified, third trimester: Secondary | ICD-10-CM

## 2021-04-29 DIAGNOSIS — Z91040 Latex allergy status: Secondary | ICD-10-CM | POA: Diagnosis not present

## 2021-04-29 DIAGNOSIS — O99214 Obesity complicating childbirth: Secondary | ICD-10-CM | POA: Diagnosis present

## 2021-04-29 LAB — GLUCOSE, CAPILLARY
Glucose-Capillary: 112 mg/dL — ABNORMAL HIGH (ref 70–99)
Glucose-Capillary: 94 mg/dL (ref 70–99)
Glucose-Capillary: 82 mg/dL (ref 70–99)

## 2021-04-29 LAB — CBC
HCT: 35.1 % — ABNORMAL LOW (ref 36.0–46.0)
Hemoglobin: 11.4 g/dL — ABNORMAL LOW (ref 12.0–15.0)
MCH: 28.7 pg (ref 26.0–34.0)
MCHC: 32.5 g/dL (ref 30.0–36.0)
MCV: 88.4 fL (ref 80.0–100.0)
Platelets: 256 10*3/uL (ref 150–400)
RBC: 3.97 MIL/uL (ref 3.87–5.11)
RDW: 13.9 % (ref 11.5–15.5)
WBC: 9.5 10*3/uL (ref 4.0–10.5)
nRBC: 0 % (ref 0.0–0.2)

## 2021-04-29 LAB — COMPREHENSIVE METABOLIC PANEL
ALT: 12 U/L (ref 0–44)
AST: 16 U/L (ref 15–41)
Albumin: 2.4 g/dL — ABNORMAL LOW (ref 3.5–5.0)
Alkaline Phosphatase: 104 U/L (ref 38–126)
Anion gap: 10 (ref 5–15)
BUN: 8 mg/dL (ref 6–20)
CO2: 19 mmol/L — ABNORMAL LOW (ref 22–32)
Calcium: 8.9 mg/dL (ref 8.9–10.3)
Chloride: 107 mmol/L (ref 98–111)
Creatinine, Ser: 0.63 mg/dL (ref 0.44–1.00)
GFR, Estimated: >60 mL/min (ref >60)
Glucose, Bld: 114 mg/dL — ABNORMAL HIGH (ref 70–99)
Potassium: 4 mmol/L (ref 3.5–5.1)
Sodium: 136 mmol/L (ref 135–145)
Total Bilirubin: 0.1 mg/dL — ABNORMAL LOW (ref 0.3–1.2)
Total Protein: 5.7 g/dL — ABNORMAL LOW (ref 6.5–8.1)

## 2021-04-29 LAB — TYPE AND SCREEN
ABO/RH(D): O POS
Antibody Screen: NEGATIVE

## 2021-04-29 LAB — RPR: RPR Ser Ql: NONREACTIVE

## 2021-04-29 MED ORDER — TERBUTALINE SULFATE 1 MG/ML IJ SOLN: 0.2500 mg | Freq: Once | INTRAMUSCULAR | Status: DC | PRN

## 2021-04-29 MED ORDER — EPHEDRINE 5 MG/ML INJ: 10.0000 mg | INTRAVENOUS | Status: DC | PRN

## 2021-04-29 MED ORDER — LACTATED RINGERS IV SOLN: INTRAVENOUS | Status: DC

## 2021-04-29 MED ORDER — LACTATED RINGERS IV SOLN: 500.0000 mL | INTRAVENOUS | Status: DC | PRN

## 2021-04-29 MED ORDER — LEVOTHYROXINE SODIUM 100 MCG PO TABS
100.0000 ug | ORAL_TABLET | Freq: Every day | ORAL | Status: DC
  Administered 2021-04-29 – 2021-05-01 (×3): 100 ug via ORAL
  Filled 2021-04-29 (×4): qty 1

## 2021-04-29 MED ORDER — OXYTOCIN-SODIUM CHLORIDE 30-0.9 UT/500ML-% IV SOLN
2.5000 [IU]/h | INTRAVENOUS | Status: DC
  Administered 2021-04-30: 2.5 [IU]/h via INTRAVENOUS

## 2021-04-29 MED ORDER — LACTATED RINGERS IV SOLN: 500.0000 mL | Freq: Once | INTRAVENOUS | Status: DC

## 2021-04-29 MED ORDER — LEVONORGESTREL 20.1 MCG/DAY IU IUD
1.0000 | INTRAUTERINE_SYSTEM | Freq: Once | INTRAUTERINE | Status: AC
  Administered 2021-04-30: 1 via INTRAUTERINE
  Filled 2021-04-29: qty 1

## 2021-04-29 MED ORDER — FENTANYL CITRATE (PF) 100 MCG/2ML IJ SOLN
50.0000 ug | INTRAMUSCULAR | Status: DC | PRN
  Administered 2021-04-29 (×2): 100 ug via INTRAVENOUS
  Filled 2021-04-29 (×2): qty 2

## 2021-04-29 MED ORDER — HYDROXYZINE HCL 50 MG PO TABS
50.0000 mg | ORAL_TABLET | Freq: Four times a day (QID) | ORAL | Status: DC | PRN
  Filled 2021-04-29: qty 1

## 2021-04-29 MED ORDER — DIPHENHYDRAMINE HCL 50 MG/ML IJ SOLN
12.5000 mg | INTRAMUSCULAR | Status: DC | PRN
Start: 2021-04-29 — End: 2021-04-30

## 2021-04-29 MED ORDER — ZOLPIDEM TARTRATE 5 MG PO TABS: 5.0000 mg | ORAL_TABLET | Freq: Every evening | ORAL | Status: DC | PRN

## 2021-04-29 MED ORDER — OXYTOCIN BOLUS FROM INFUSION
333.0000 mL | Freq: Once | INTRAVENOUS | Status: AC
  Administered 2021-04-30: 333 mL via INTRAVENOUS

## 2021-04-29 MED ORDER — ONDANSETRON HCL 4 MG/2ML IJ SOLN
4.0000 mg | Freq: Four times a day (QID) | INTRAMUSCULAR | Status: DC | PRN
  Administered 2021-04-29 – 2021-04-30 (×2): 4 mg via INTRAVENOUS
  Filled 2021-04-29 (×2): qty 2

## 2021-04-29 MED ORDER — PHENYLEPHRINE 40 MCG/ML (10ML) SYRINGE FOR IV PUSH (FOR BLOOD PRESSURE SUPPORT): 80.0000 ug | PREFILLED_SYRINGE | INTRAVENOUS | Status: DC | PRN

## 2021-04-29 MED ORDER — FENTANYL-BUPIVACAINE-NACL 0.5-0.125-0.9 MG/250ML-% EP SOLN
12.0000 mL/h | EPIDURAL | Status: DC | PRN
  Administered 2021-04-30: 12 mL/h via EPIDURAL
  Filled 2021-04-29: qty 250

## 2021-04-29 MED ORDER — MISOPROSTOL 25 MCG QUARTER TABLET
25.0000 ug | ORAL_TABLET | ORAL | Status: DC | PRN
  Administered 2021-04-29 (×2): 25 ug via VAGINAL
  Filled 2021-04-29 (×2): qty 1

## 2021-04-29 MED ORDER — FLEET ENEMA 7-19 GM/118ML RE ENEM: 1.0000 | ENEMA | Freq: Every day | RECTAL | Status: DC | PRN

## 2021-04-29 MED ORDER — LEVONORGESTREL 20.1 MCG/DAY IU IUD: 1.0000 | INTRAUTERINE_SYSTEM | Freq: Once | INTRAUTERINE | Status: DC

## 2021-04-29 MED ORDER — MISOPROSTOL 50MCG HALF TABLET
50.0000 ug | ORAL_TABLET | ORAL | Status: DC
  Administered 2021-04-29 (×2): 50 ug via BUCCAL
  Filled 2021-04-29: qty 1

## 2021-04-29 MED ORDER — SOD CITRATE-CITRIC ACID 500-334 MG/5ML PO SOLN: 30.0000 mL | ORAL | Status: DC | PRN

## 2021-04-29 MED ORDER — OXYTOCIN-SODIUM CHLORIDE 30-0.9 UT/500ML-% IV SOLN
1.0000 m[IU]/min | INTRAVENOUS | Status: DC
  Administered 2021-04-30: 2 m[IU]/min via INTRAVENOUS
  Filled 2021-04-29: qty 500

## 2021-04-29 MED ORDER — MISOPROSTOL 50MCG HALF TABLET
ORAL_TABLET | ORAL | Status: AC
  Filled 2021-04-29: qty 1

## 2021-04-29 MED ORDER — ACETAMINOPHEN 325 MG PO TABS: 650.0000 mg | ORAL_TABLET | ORAL | Status: DC | PRN

## 2021-04-29 MED ORDER — LIDOCAINE HCL (PF) 1 % IJ SOLN: 30.0000 mL | INTRAMUSCULAR | Status: DC | PRN

## 2021-04-29 NOTE — Progress Notes (Addendum)
Labor Progress Note Michele Armstrong is a 35 y.o. G3P1011 at 38w0dpresented for IOL-GDMA2 (insulin and metformin).  S: Patient is resting on her side in bed, doing well.   O:  BP (!) 100/48   Pulse 93   Temp 98 F (36.7 C) (Oral)   Resp 16   Ht '5\' 4"'$  (1.626 m)   Wt 102.5 kg   LMP 08/13/2020 (Exact Date)   BMI 38.79 kg/m  EFM: baseline ~150 BPM/+accels/-decels/mod variability Toco: contractions ~q5-7 min-not tracing well  CVE: Dilation: 1 Effacement (%): 60 Cervical Position: Middle Station: -3 Presentation: Vertex Exam by:: KMaye Hides CNM   A&P: 35y.o. G3P1011 345w0dOL-GDMA2 (insulin and metformin). #Labor: Still closed, cytotec x3 (last dose '@1646'$ ). CC80/60 placed '@1940'$ . Will consider cytotec vs pitocin.  #Pain: prn #FWB: cat 1 #GBS negative #GDMA2: CBG-94>82. Cont monitoring   CC Placement:  Procedure done to begin ripening of the cervix for induction of labor performed by KaMaye HidesNM. Appropriate time out taken. The patient was placed in the lithotomy position and a cervical exam was performed and a finger was used to guide the 37F foley", Cook Catheter through the internal os of the cervix. Cooks filled with 80cc of water filled in cervical ballon and 60cc filled in vaginal balloon. CC placed on tension and taped to medial thigh.   AlErskine EmeryMD [facility] for WoDean Foods CompanyCoHartley/28/2022 7:59 PM   Attestation of Supervision of Student:  I confirm that I have verified the information documented in the  resident  student's note and that I have also personally reperformed the history, physical exam and all medical decision making activities.  I have verified that all services and findings are accurately documented in this student's note; and I agree with management and plan as outlined in the documentation. I have also made any necessary editorial changes.   KaStarr LakeCNBellevueor WoDean Foods CompanyCoAdamsroup 04/29/2021 8:16 PM

## 2021-04-29 NOTE — H&P (Addendum)
OBSTETRIC ADMISSION HISTORY AND PHYSICAL  Michele Armstrong is a 35 y.o. female G3P1011 with IUP at 79w0dby LMP presenting for IOL-GDMA2 (insulin and metformin). She reports +FMs, No LOF, no VB, no blurry vision, headaches or peripheral edema, and RUQ pain.  She plans on breast feeding. She request IUD for birth control. She received her prenatal care at [facility].   Dating: By LMP --->  Estimated Date of Delivery: 05/20/21  Sono:    04/20/2021_0 , CWD, normal anatomy, cephalic presentation, anterior placental lie, 2097g, 3% EFW  Prenatal History/Complications:  Anxiety Depression Hypothyroid-on synthroid  Past Medical History: Past Medical History:  Diagnosis Date   Anxiety    Depression    Fatigue    Gall stone    Gestational diabetes    Headache(784.0)    Hypothyroid    Internal hemorrhoids    PTSD (post-traumatic stress disorder)    tachycardia    tachycardia    Past Surgical History: Past Surgical History:  Procedure Laterality Date   CHOLECYSTECTOMY     FLEXIBLE SIGMOIDOSCOPY N/A 06/01/2017   Procedure: FLEXIBLE SIGMOIDOSCOPY;  Surgeon: JMilus Banister MD;  Location: [facility] ENDOSCOPY;  Service: Endoscopy;  Laterality: N/A;   HEMORRHOID BANDING  2018   WISDOM TOOTH EXTRACTION      Obstetrical History: OB History     Gravida  3   Para  1   Term  1   Preterm      AB  1   Living  1      SAB      IAB      Ectopic      Multiple  0   Live Births  1           Social History Social History   Socioeconomic History   Marital status: Divorced    Spouse name: Not on file   Number of children: 1   Years of education: Not on file   Highest education level: Not on file  Occupational History   Occupation: homemaker  Tobacco Use   Smoking status: Former    Types: Cigarettes    Quit date: 01/01/2021    Years since quitting: 0.3   Smokeless tobacco: Never   Tobacco comments:    quit smoking about 2 days ago  Vaping Use   Vaping Use: Former    Substances: Nicotine, Flavoring  Substance and Sexual Activity   Alcohol use: Not Currently   Drug use: Not Currently    Types: Marijuana   Sexual activity: Yes    Partners: Male  Other Topics Concern   Not on file  Social History Narrative   Married, homemaker, son Malachi born 2018      [place] CE[place] Chemicalhaving mental issues (schizo with hallucinations)   Social Determinants of Health   Financial Resource Strain: Not on file  Food Insecurity: No Food Insecurity   Worried About RCharity fundraiserin the Last Year: Never true   RWilmontin the Last Year: Never true  Transportation Needs: No Transportation Needs   Lack of Transportation (Medical): No   Lack of Transportation (Non-Medical): No  Physical Activity: Not on file  Stress: Not on file  Social Connections: Not on file    Family History: Family History  Problem Relation Age of Onset   Alcohol abuse Father    Drug abuse Father    Hypertension Father    Hypertension  Mother    Diabetes Mother    Leukemia Maternal Grandmother    Breast cancer Maternal Aunt    Colon cancer Neg Hx    Stomach cancer Neg Hx     Allergies: Allergies  Allergen Reactions   Morphine And Related Nausea And Vomiting   Latex Rash    Causes UTI when condom used.    Medications Prior to Admission  Medication Sig Dispense Refill Last Dose   Accu-Chek Softclix Lancets lancets DX: O24:419. Check BS QID 100 each 12    Blood Glucose Monitoring Suppl (ACCU-CHEK GUIDE) w/Device KIT DX: O24:419. Check BS QID 1 kit 0    cetirizine (ZYRTEC) 10 MG tablet Take by mouth.      glucose blood test strip DX: O24:419. Check BS QID 100 each 12    hydrocortisone (ANUSOL-HC) 2.5 % rectal cream Place rectally 2 (two) times daily. 30 g 2    insulin detemir (LEVEMIR FLEXTOUCH) 100 UNIT/ML FlexPen Inject 25 Units into the skin at bedtime. 15 mL 11    Insulin Pen Needle 31G X 6 MM MISC 1  Units by Does not apply route daily. 100 each 3    levothyroxine (SYNTHROID) 100 MCG tablet Take 1 tablet (100 mcg total) by mouth daily before breakfast. 30 tablet 3    metFORMIN (GLUCOPHAGE) 500 MG tablet Take 1 tablet (500 mg total) by mouth at bedtime. 60 tablet 3    omeprazole (PRILOSEC OTC) 20 MG tablet Take 1 tablet (20 mg total) by mouth daily. 30 tablet 3    ondansetron (ZOFRAN ODT) 4 MG disintegrating tablet Take 1 tablet (4 mg total) by mouth every 6 (six) hours as needed for nausea. 20 tablet 2    Prenat-FeAsp-Meth-FA-DHA w/o A (PRENATE PIXIE) 10-0.6-0.4-200 MG CAPS Take 1 capsule by mouth daily.      promethazine (PHENERGAN) 25 MG suppository Place 1 suppository (25 mg total) rectally every 6 (six) hours as needed for nausea or vomiting. 30 suppository 6    pyridOXINE (VITAMIN B-6) 50 MG tablet Take 1 tablet (50 mg total) by mouth in the morning and at bedtime. 60 tablet 1    scopolamine (TRANSDERM-SCOP) 1 MG/3DAYS Place 1 patch (1.5 mg total) onto the skin every 3 (three) days. 10 patch 2      Review of Systems   All systems reviewed and negative except as stated in HPI  Blood pressure 117/80, pulse 82, height _0  (1.626 m), weight 102.5 kg, last menstrual period 08/13/2020, currently breastfeeding. General appearance: alert, cooperative, and appears stated age Lungs: Normal WOB Abdomen: soft, non-tender; gravid Pelvic: As stated below Extremities: Homans sign is negative, no sign of DVT Presentation: cephalic Fetal monitoringBaseline: 130 bpm, Variability: Good {> 6 bpm), Accelerations: Reactive, and Decelerations: Absent Uterine activity: Currently no contractions Dilation: Closed Effacement (%): Thick Station: -3 Exam by:: lee Prenatal labs: ABO, Rh: O/Positive/-- (01/28 0959) Antibody: Negative (01/28 0959) Rubella: 1.43 (01/28 0959) RPR: Non Reactive (05/16 0930)  HBsAg: Negative (01/28 0959)  HIV: Non Reactive (05/16 0930)  GBS: Negative/-- (07/20 1016)  2  hr Glucola failed: GDMA2 (insulin and metformin) Genetic screening: low risk female Anatomy US wnl  Prenatal Transfer Tool  Maternal Diabetes: Yes:  Diabetes Type:  Insulin/Medication controlled Genetic Screening: Normal Maternal Ultrasounds/Referrals: Other: 3%EFW Fetal Ultrasounds or other Referrals:  Referred to Materal Fetal Medicine  Maternal Substance Abuse:  No Significant Maternal Medications:  Insulin and MTF Significant Maternal Lab Results: Group B Strep negative  Results for orders placed or  performed during the hospital encounter of 04/29/21 (from the past 24 hour(s))  Glucose, capillary   Collection Time: 04/29/21  8:15 AM  Result Value Ref Range   Glucose-Capillary 112 (H) 70 - 99 mg/dL    Patient Active Problem List   Diagnosis Date Noted   IUGR (intrauterine growth restriction) affecting care of mother, third trimester 04/21/2021   Nausea and vomiting during pregnancy 04/03/2021   Pancreatitis 04/02/2021   Gestational diabetes 02/17/2021   Adjustment disorder with mixed anxiety and depressed mood 01/05/2021   Supervision of high-risk pregnancy, third trimester 10/30/2020   Obesity affecting pregnancy, antepartum 10/30/2020   Bipolar 1 disorder ([place]) 04/21/2020   Elevated LFTs 03/26/2020   Obesity, Class III, BMI 40-49.9 (morbid obesity) ([place]) 01/23/2020   Gastroesophageal reflux disease 05/30/2018   Hemorrhoids 05/30/2018   Stress incontinence, female 10/27/2017   Nevus of right shoulder 10/24/2017   Chronic back pain 08/28/2017   Chronic pain of both knees 08/28/2017   Insomnia 08/28/2017   Prolapsed internal hemorrhoids, grade 2 06/07/2017   Latex allergy 06/07/2017   Large breasts 01/10/2017   Pain aggravated by breast feeding 01/10/2017   PTSD (post-traumatic stress disorder) 08/22/2011   Depression, major, recurrent ([place]) 08/22/2011   Hypothyroid 08/22/2011    Assessment/Plan:  Michele Armstrong is a 35 y.o. G3P1011 at 57w0dhere for IOL-GDMA2  (insulin and metformin).  #IOL:  Pt was closed on exam, cytotec given _0 . On next check may consider FB depending on dilation.  #Pain: prn #FWB: Cat 1 #ID:  GBS neg #MOF: breast #MOC: IUD PP  #Circ:  Yes #GDMA2: Has been on MTF and insulin. Q4hr CBG to monitor sugar. Most recent-112. Monitor. #Hypothyroid: Will order home synthroid dose. Last TSH-0.911 #Hx anxiety/depression/PTSD: SW PP  AErskine Emery MD  04/29/2021, 9:03 AM  GME ATTESTATION:  I saw and evaluated the patient. I agree with the findings and the plan of care as documented in the resident's note.  CVilma Meckel MD OB Fellow, FRocky Hillfor WBoston7/28/2022 12:40 PM

## 2021-04-29 NOTE — Progress Notes (Signed)
Labor Progress Note Michele Armstrong is a 35 y.o. G3P1011 at 65w0dpresented for IOL-GDMA2 (insulin and metformin).  S: Patient having more discomfort with contractions but has been able to breath through them. Discussed post placental IUD with patient including risks, benefits and expectations and patient agreed and signed consents for post placental IUD.  Patient wanted to discuss prior birth process including some concerns about pitocin which she states made her very nauseous last time and required she get an epidural early. She would like to think about pain management and possibly epidural placement prior to pitocin start  O:  BP (!) 131/93   Pulse 91   Temp 98 F (36.7 C) (Oral)   Resp 16   Ht '5\' 4"'$  (1.626 m)   Wt 102.5 kg   LMP 08/13/2020 (Exact Date)   BMI 38.79 kg/m  EFM: 145 BPM/+accels/-decels/mod variability. Some poor tracing Toco: contractions ~q4-6 min-not tracing well  CVE: Dilation: 1 Effacement (%): 60 Cervical Position: Middle Station: -3 Presentation: Vertex Exam by:: KMaye Hides CNM   A&P: 35y.o. G3P1011 331w0dOL-GDMA2 (insulin and metformin). #Labor: Still closed, cytotec x4 (last dose '@2056'$ ). CC80/60 placed '@1940'$ . Will consider pitocin after 0056 #Pain: prn, epidural as desired #FWB: cat 1 #GBS negative #GDMA2: CBG-94>82. Cont monitoring   AnWaldon MerlMD 04/29/2021 10:52 PM

## 2021-04-29 NOTE — Progress Notes (Addendum)
Labor Progress Note Michele Armstrong is a 35 y.o. G3P1011 at 2w0dpresented for IOL-GDMA2 (insulin and metformin).  S: Patient is resting comfortably with mom in room.  O:  BP 117/80   Pulse 82   Temp 98 F (36.7 C) (Oral)   Resp 16   Ht '5\' 4"'$  (1.626 m)   Wt 102.5 kg   LMP 08/13/2020 (Exact Date)   BMI 38.79 kg/m  EFM: baseline 140 BPM/+accels/-decels/mod variability Toco: contractions ~q8-10 min, not regular  CVE: Dilation: Closed Effacement (%): Thick Cervical Position: Middle Station: -3 Presentation: Vertex Exam by:: Dr.Albert   A&P: 35y.o. G3P1011 376w0dOL-GDMA2 (insulin and metformin). #Labor: Still closed, cytotec x2 (last dose '@1230'$ ). Will recheck in 4-5 hours and evaluate need for possible FB. #Pain: prn #FWB: cat 1 #GBS negative   AlErskine EmeryMD [facility] for WoDean Foods CompanyCoMadison2:37 PM   GME ATTESTATION:  I saw and evaluated the patient. I agree with the findings and the plan of care as documented in the resident's note.  ChVilma MeckelMD  OB Fellow, [place] Wo[place]/28/2022 12:42 PM

## 2021-04-29 NOTE — Progress Notes (Addendum)
Labor Progress Note Michele Armstrong is a 35 y.o. G3P1011 at 42w0dpresented for IOL-GDMA2 (insulin and metformin).  S: Patient is resting with mom in room, feeling contractions.  O:  BP 125/81   Pulse 84   Temp 98 F (36.7 C) (Oral)   Resp 16   Ht '5\' 4"'$  (1.626 m)   Wt 102.5 kg   LMP 08/13/2020 (Exact Date)   BMI 38.79 kg/m  EFM: baseline ~150 BPM/+accels/-decels/mod variability Toco: contractions ~q5-7 min, not regular  CVE: Dilation: Closed Effacement (%): Thick Cervical Position: Middle Station: -3 Presentation: Vertex Exam by:: Leona Pressly   A&P: 35y.o. G3P1011 369w0dOL-GDMA2 (insulin and metformin). #Labor: Still closed, cytotec x3 (last dose '@1646'$ ). Will recheck soon to decide if a FB can be placed based on dilation.   #Pain: prn #FWB: cat 1-borderline fetal tachy, mod variability, accels present with no decels. Overall, reassuring. Cont monitoring. #GBS negative #GDMA2: CBG-94   AlErskine EmeryMD [facility] for WoDe[facility] Foods CompanyCoHagarville:04 PM  Attestation of Supervision of Student:  I confirm that I have verified the information documented in the  resident  student's note and that I have also personally reperformed the history, physical exam and all medical decision making activities.  I have verified that all services and findings are accurately documented in this student's note; and I agree with management and plan as outlined in the documentation. I have also made any necessary editorial changes.    KaStarr LakeCNCrestwoodor WoDean Foods CompanyCoAshertonroup 04/29/2021 6:43 PM

## 2021-04-30 ENCOUNTER — Inpatient Hospital Stay (HOSPITAL_COMMUNITY): Payer: Medicaid Other | Admitting: Anesthesiology

## 2021-04-30 ENCOUNTER — Encounter (HOSPITAL_COMMUNITY): Payer: Self-pay | Admitting: Obstetrics and Gynecology

## 2021-04-30 DIAGNOSIS — O365930 Maternal care for other known or suspected poor fetal growth, third trimester, not applicable or unspecified: Secondary | ICD-10-CM

## 2021-04-30 DIAGNOSIS — O24425 Gestational diabetes mellitus in childbirth, controlled by oral hypoglycemic drugs: Secondary | ICD-10-CM

## 2021-04-30 DIAGNOSIS — Z3A37 37 weeks gestation of pregnancy: Secondary | ICD-10-CM

## 2021-04-30 DIAGNOSIS — Z30430 Encounter for insertion of intrauterine contraceptive device: Secondary | ICD-10-CM

## 2021-04-30 LAB — GLUCOSE, CAPILLARY
Glucose-Capillary: 106 mg/dL — ABNORMAL HIGH (ref 70–99)
Glucose-Capillary: 109 mg/dL — ABNORMAL HIGH (ref 70–99)
Glucose-Capillary: 113 mg/dL — ABNORMAL HIGH (ref 70–99)
Glucose-Capillary: 88 mg/dL (ref 70–99)
Glucose-Capillary: 96 mg/dL (ref 70–99)

## 2021-04-30 MED ORDER — LIDOCAINE HCL (PF) 1 % IJ SOLN
INTRAMUSCULAR | Status: DC | PRN
  Administered 2021-04-30: 3 mL via EPIDURAL
  Administered 2021-04-30: 7 mL via EPIDURAL

## 2021-04-30 MED ORDER — PRENATAL MULTIVITAMIN CH
1.0000 | ORAL_TABLET | Freq: Every day | ORAL | Status: DC
  Administered 2021-05-01 – 2021-05-02 (×2): 1 via ORAL
  Filled 2021-04-30 (×2): qty 1

## 2021-04-30 MED ORDER — MEASLES, MUMPS & RUBELLA VAC IJ SOLR: 0.5000 mL | Freq: Once | INTRAMUSCULAR | Status: DC

## 2021-04-30 MED ORDER — SIMETHICONE 80 MG PO CHEW
80.0000 mg | CHEWABLE_TABLET | ORAL | Status: DC | PRN
  Administered 2021-05-01 (×2): 80 mg via ORAL
  Filled 2021-04-30 (×2): qty 1

## 2021-04-30 MED ORDER — ONDANSETRON HCL 4 MG PO TABS: 4.0000 mg | ORAL_TABLET | ORAL | Status: DC | PRN

## 2021-04-30 MED ORDER — DIPHENHYDRAMINE HCL 25 MG PO CAPS: 25.0000 mg | ORAL_CAPSULE | Freq: Four times a day (QID) | ORAL | Status: DC | PRN

## 2021-04-30 MED ORDER — WITCH HAZEL-GLYCERIN EX PADS: 1.0000 "application " | MEDICATED_PAD | CUTANEOUS | Status: DC | PRN

## 2021-04-30 MED ORDER — SENNOSIDES-DOCUSATE SODIUM 8.6-50 MG PO TABS
2.0000 | ORAL_TABLET | Freq: Every day | ORAL | Status: DC
  Administered 2021-05-01: 2 via ORAL
  Filled 2021-04-30 (×2): qty 2

## 2021-04-30 MED ORDER — MEDROXYPROGESTERONE ACETATE 150 MG/ML IM SUSP: 150.0000 mg | INTRAMUSCULAR | Status: DC | PRN

## 2021-04-30 MED ORDER — ACETAMINOPHEN 325 MG PO TABS
650.0000 mg | ORAL_TABLET | ORAL | Status: DC | PRN
  Administered 2021-05-02: 650 mg via ORAL
  Filled 2021-04-30: qty 2

## 2021-04-30 MED ORDER — DIBUCAINE (PERIANAL) 1 % EX OINT: 1.0000 "application " | TOPICAL_OINTMENT | CUTANEOUS | Status: DC | PRN

## 2021-04-30 MED ORDER — ERYTHROMYCIN 5 MG/GM OP OINT
TOPICAL_OINTMENT | OPHTHALMIC | Status: AC
  Filled 2021-04-30: qty 1

## 2021-04-30 MED ORDER — ONDANSETRON HCL 4 MG/2ML IJ SOLN: 4.0000 mg | INTRAMUSCULAR | Status: DC | PRN

## 2021-04-30 MED ORDER — TETANUS-DIPHTH-ACELL PERTUSSIS 5-2.5-18.5 LF-MCG/0.5 IM SUSY: 0.5000 mL | PREFILLED_SYRINGE | Freq: Once | INTRAMUSCULAR | Status: DC

## 2021-04-30 MED ORDER — COCONUT OIL OIL: 1.0000 "application " | TOPICAL_OIL | Status: DC | PRN

## 2021-04-30 MED ORDER — BENZOCAINE-MENTHOL 20-0.5 % EX AERO: 1.0000 "application " | INHALATION_SPRAY | CUTANEOUS | Status: DC | PRN

## 2021-04-30 MED ORDER — IBUPROFEN 600 MG PO TABS
600.0000 mg | ORAL_TABLET | Freq: Four times a day (QID) | ORAL | Status: DC
  Administered 2021-04-30 – 2021-05-02 (×8): 600 mg via ORAL
  Filled 2021-04-30 (×8): qty 1

## 2021-04-30 NOTE — Anesthesia Preprocedure Evaluation (Signed)
Anesthesia Evaluation  Patient identified by MRN, date of birth, ID band Patient awake    Reviewed: Allergy & Precautions, H&P , NPO status , Patient's Chart, lab work & pertinent test results  History of Anesthesia Complications Negative for: history of anesthetic complications  Airway Mallampati: II  TM Distance: >3 FB     Dental   Pulmonary neg pulmonary ROS, former smoker,    Pulmonary exam normal        Cardiovascular negative cardio ROS   Rhythm:regular Rate:Normal     Neuro/Psych Anxiety Depression Bipolar Disorder negative neurological ROS  negative psych ROS   GI/Hepatic Neg liver ROS, GERD  ,  Endo/Other  diabetes, Insulin Dependent, Oral Hypoglycemic AgentsHypothyroidism   Renal/GU negative Renal ROS  negative genitourinary   Musculoskeletal   Abdominal   Peds  Hematology negative hematology ROS (+)   Anesthesia Other Findings   Reproductive/Obstetrics (+) Pregnancy                             Anesthesia Physical Anesthesia Plan  ASA: 3  Anesthesia Plan: Epidural   Post-op Pain Management:    Induction:   PONV Risk Score and Plan:   Airway Management Planned:   Additional Equipment:   Intra-op Plan:   Post-operative Plan:   Informed Consent: I have reviewed the patients History and Physical, chart, labs and discussed the procedure including the risks, benefits and alternatives for the proposed anesthesia with the patient or authorized representative who has indicated his/her understanding and acceptance.       Plan Discussed with:   Anesthesia Plan Comments:         Anesthesia Quick Evaluation

## 2021-04-30 NOTE — Progress Notes (Signed)
Patient ID: Virgina Organ, female   DOB: 02-04-1986, 35 y.o.   MRN: [id number]  Labor Progress Note Kaedyn Macias is a 35 y.o. G3P1011 at 3w1dpresented for IOL for A2GDM (nightly insulin and MTF) with FGR (3%ile '@35'$ -5wks). Also has a history of recent SA (does well with cervical exams), anxiety/dep/PTSD  S:  Napping in bed, starting to feel frustrated with lack of progress. MGM at bedside for support.  O:  BP (!) 161/62   Pulse 91   Temp 98.1 F (36.7 C) (Oral)   Resp 16   Ht '5\' 4"'$  (1.626 m)   Wt 226 lb (102.5 kg)   LMP 08/13/2020 (Exact Date)   BMI 38.79 kg/m  EFM: baseline 135 bpm/ moderate variability/ 15x15 accels/ no decels  Toco/IUPC: irregular SVE: Dilation: 6 Effacement (%): 50 Cervical Position: Posterior Station: -1 Presentation: Vertex Exam by:: Julizza Sassone,cnm Pitocin: 22 mu/min  A/P: 35y.o. G3P1011 329w1d1. Labor: Active, contractions not tracing well 2. FWB: Cat 1 3. Pain: Well-controlled with epidural 4. GDM: q4hr CBGs have been stable, last fasting was 82 5. AROM completed with clear fluid, baby came down to -1 station. Pt repositioned to peanut ball, will continue pitocin titration. Anticipate SVD.  JaGabriel CarinaCNM 5:34 PM

## 2021-04-30 NOTE — Progress Notes (Signed)
Labor Progress Note Michele Armstrong is a 35 y.o. G3P1011 at 61w0dpresented for IOL-GDMA2 (insulin and metformin).  S: Patient still having some discomfort with contractions but is happy her cook catheter came out although having it come out while she was in the bathroom had made her very anxious. She would like her epidural prior to starting pitocin to continue her induction  O:  BP 125/78   Pulse 83   Temp 98 F (36.7 C) (Oral)   Resp 16   Ht '5\' 4"'$  (1.626 m)   Wt 102.5 kg   LMP 08/13/2020 (Exact Date)   BMI 38.79 kg/m  EFM: 145 BPM/+accels/-decels/mod variability. Some poor tracing Toco: contractions ~q4-6 min-not tracing well  CVE: Dilation: 4 Effacement (%): 50 Cervical Position: Posterior Station: -3 Presentation: Vertex Exam by:: [name], RN   A&P: 35y.o. G3P1011 350w0dOL-GDMA2 (insulin and metformin). #Labor: Still closed, cytotec x4 (last dose '@2056'$ ). CC80/60 placed '@1940'$ , out @ 0030. Plan to start pitocin after epidural #Pain: Epidural ordered #FWB: cat 1 #GBS negative #GDMA2: CBG-94>82>106. Cont monitoring   AnWaldon MerlMD 04/30/2021 1:53 AM

## 2021-04-30 NOTE — Lactation Note (Signed)
This note was copied from a baby's chart. Lactation Consultation Note  Patient Name: Michele Armstrong [id number] Date: 04/30/2021   Age:35 hours  LC talked with RN, Michele Armstrong, Mom stated she did not need LC services at this time.   Maternal Data    Feeding    LATCH Score                    Lactation Tools Discussed/Used    Interventions    Discharge    Consult Status      Michele Armstrong  Michele Armstrong 04/30/2021, 7:33 PM

## 2021-04-30 NOTE — Progress Notes (Signed)
Patient ID: Michele Armstrong, female   DOB: 1985-12-22, 35 y.o.   MRN: [id number]  Labor Progress Note Deyani Spainhower is a 35 y.o. G3P1011 at 54w1dpresented for IOL for A2GDM (nightly insulin and MTF) with FGR (3%ile '@35'$ -5wks). Also has a history of recent SA (does well with cervical exams), anxiety/dep/PTSD  S:  Attempted initial visit at 0900, pt was in a virtual visit with her therapist. Came back at 1030, pt sitting [facility], in good spirits, getting ready to have some breakfast (clear liquids). Feeling the occasional "twinge" but otherwise comfortable with her epidural.  O:  BP 115/70   Pulse 87   Temp 98.2 F (36.8 C) (Oral)   Resp 16   Ht '5\' 4"'$  (1.626 m)   Wt 226 lb (102.5 kg)   LMP 08/13/2020 (Exact Date)   BMI 38.79 kg/m  EFM: baseline 135 bpm/ moderate variability/ 15x15 accels/ no decels  Toco/IUPC: irregular SVE: Dilation: 5 Effacement (%): 60 Cervical Position: Posterior Station: Ballotable Presentation: Vertex Exam by:: Aristea Posada,cnm Pitocin: 16 mu/min  A/P: 35y.o. G3P1011 379w1d1. Labor: Latent to active, contractions not regular yet 2. FWB: Cat 1 3. Pain: Well-controlled with epidural 4. GDM: q4hr CBGs have been stable, last fasting was 82   Continue to titrate pitocin and use positioning to facilitate fetal descent. Anticipate SVD.  JaGabriel CarinaCNM 10:53 AM

## 2021-04-30 NOTE — Plan of Care (Signed)
Michele Armstrong

## 2021-04-30 NOTE — Progress Notes (Addendum)
Labor Progress Note Michele Armstrong is a 35 y.o. G3P1011 at 58w0dpresented for IOL-GDMA2 (insulin and metformin).  S:  Patient feeling some shaking and nausea that is making her feel like she did in her previous delivery when she made rapid cervical change. Otherwise she is having bilateral nipple pain and says her Raynaud's is acting up again and needs heating pads for them. She is also concerned that her patella dislocated in the bed and states she has a history of this in the past  O:  BP 114/74   Pulse 74   Temp 98.6 F (37 C) (Oral)   Resp 16   Ht '5\' 4"'$  (1.626 m)   Wt 102.5 kg   LMP 08/13/2020 (Exact Date)   BMI 38.79 kg/m  EFM: 150 BPM/+accels/-decels/mod variability.  Toco: contractions ~q2-4 min-not tracing well  CVE:  Dilation: 5 Effacement (%): 60 Cervical Position: Posterior Station: Ballotable Presentation: Vertex Exam by:: KThornell MuleMD   A&P: 35y.o. G3P1011 363w0dOL-GDMA2 (insulin and metformin). #Labor: Cytotec x4 (last dose '@2056'$ ). CC80/60 placed '@1940'$ , out @ [id number] started 0230, consider AROM once cervix becomes less posterior #Pain: Epidural in place #FWB: cat 1 #GBS negative #GDMA2: CBG-94>82>106>109. Cont monitoring  #Elevated BP: 1 BP with diastolic >[id number], otherwise normotensive  AnWaldon MerlMD 04/30/2021 5:43 AM

## 2021-04-30 NOTE — Discharge Summary (Signed)
Postpartum Discharge Summary     Patient Name: Michele Armstrong DOB: 1986-01-07 MRN: [id number]  Date of admission: 04/29/2021 Delivery date:04/30/2021  Delivering provider: Renard Matter  Date of discharge: 05/02/2021  Admitting diagnosis: IUGR (intrauterine growth restriction) affecting care of mother, third trimester, not applicable or unspecified fetus [O36.5930] Intrauterine pregnancy: [redacted]w[redacted]d     Secondary diagnosis:  Active Problems:   Hypothyroid   Supervision of high-risk pregnancy, third trimester   Gestational diabetes   IUGR (intrauterine growth restriction) affecting care of mother, third trimester   Vaginal delivery  Additional problems: None    Discharge diagnosis: Term Pregnancy Delivered                                              Post partum procedures: post placental IUD placement Augmentation: AROM, Pitocin, Cytotec, and IP Foley Complications: None  Hospital course: Induction of Labor With Vaginal Delivery   35 y.o. yo [id number] at [redacted]w[redacted]d was admitted to the hospital 04/29/2021 for induction of labor.  Indication for induction: A2 DM.  Patient had an uncomplicated labor course as follows: Membrane Rupture Time/Date: 4:58 PM ,04/30/2021   Delivery Method:Vaginal, Spontaneous  Episiotomy: None  Lacerations:  None  Details of delivery can be found in separate delivery note.  Patient had a routine postpartum course. Patient is discharged home 05/02/21.  Newborn Data: Birth date:04/30/2021  Birth time:7:00 PM  Gender:Female  Living status:Living  Apgars:9 ,9  Weight:2574 g   Magnesium Sulfate received: No BMZ received: No Rhophylac:N/A MMR:N/A T-DaP:Given prenatally Flu: N/A Transfusion:No  Physical exam  Vitals:   04/30/21 1945 04/30/21 2109 04/30/21 2157 05/01/21 0408  BP: 100/67 124/80 112/77 106/70  Pulse:  76 91 69  Resp:  $Remo'18 18 18  'mhSsr$ Temp: 98.4 F (36.9 C) 98.4 F (36.9 C) 98.3 F (36.8 C) 98.2 F (36.8 C)  TempSrc: Oral Oral Oral Oral  SpO2:  100%  99% 100%  Weight:      Height:       General: alert, cooperative, and no distress Lochia: appropriate Uterine Fundus: firm Incision: N/A DVT Evaluation: No evidence of DVT seen on physical exam. Labs: Lab Results  Component Value Date   WBC 9.5 04/29/2021   HGB 11.4 (L) 04/29/2021   HCT 35.1 (L) 04/29/2021   MCV 88.4 04/29/2021   PLT 256 04/29/2021   CMP Latest Ref Rng & Units 05/01/2021  Glucose 70 - 99 mg/dL 102(H)  BUN 6 - 20 mg/dL -  Creatinine 0.44 - 1.00 mg/dL -  Sodium 135 - 145 mmol/L -  Potassium 3.5 - 5.1 mmol/L -  Chloride 98 - 111 mmol/L -  CO2 22 - 32 mmol/L -  Calcium 8.9 - 10.3 mg/dL -  Total Protein 6.5 - 8.1 g/dL -  Total Bilirubin 0.3 - 1.2 mg/dL -  Alkaline Phos 38 - 126 U/L -  AST 15 - 41 U/L -  ALT 0 - 44 U/L -   Edinburgh Score: Edinburgh Postnatal Depression Scale Screening Tool 04/30/2021  I have been able to laugh and see the funny side of things. 0  I have looked forward with enjoyment to things. 0  I have blamed myself unnecessarily when things went wrong. 0  I have been anxious or worried for no good reason. 0  I have felt scared or panicky for no good reason. 0  Things  have been getting on top of me. 1  I have been so unhappy that I have had difficulty sleeping. 1  I have felt sad or miserable. 0  I have been so unhappy that I have been crying. 1  The thought of harming myself has occurred to me. 0  Edinburgh Postnatal Depression Scale Total 3     After visit meds:  Allergies as of 05/02/2021       Reactions   Morphine And Related Nausea And Vomiting   Latex Rash   Causes UTI when condom used.        Medication List     STOP taking these medications    Accu-Chek Guide w/Device Kit   Accu-Chek Softclix Lancets lancets   cetirizine 10 MG tablet Commonly known as: ZYRTEC   glucose blood test strip   Insulin Pen Needle 31G X 6 MM Misc   Levemir FlexTouch 100 UNIT/ML FlexPen Generic drug: insulin detemir   metFORMIN  500 MG tablet Commonly known as: Glucophage   ondansetron 4 MG disintegrating tablet Commonly known as: Zofran ODT   pyridOXINE 50 MG tablet Commonly known as: VITAMIN B-6   scopolamine 1 MG/3DAYS Commonly known as: TRANSDERM-SCOP       TAKE these medications    acetaminophen 325 MG tablet Commonly known as: Tylenol Take 2 tablets (650 mg total) by mouth every 4 (four) hours as needed (for pain scale < 4).   hydrocortisone 2.5 % rectal cream Commonly known as: ANUSOL-HC Place rectally 2 (two) times daily.   ibuprofen 600 MG tablet Commonly known as: ADVIL Take 1 tablet (600 mg total) by mouth every 6 (six) hours.   levothyroxine 100 MCG tablet Commonly known as: Synthroid Take 1 tablet (100 mcg total) by mouth daily before breakfast.   omeprazole 20 MG tablet Commonly known as: PriLOSEC OTC Take 1 tablet (20 mg total) by mouth daily.   Prenate Pixie 10-0.6-0.4-200 MG Caps Take 1 capsule by mouth daily.   promethazine 25 MG suppository Commonly known as: PHENERGAN Place 1 suppository (25 mg total) rectally every 6 (six) hours as needed for nausea or vomiting.         Discharge home in stable condition Infant Feeding: Breast Infant Disposition:home with mother Discharge instruction: per After Visit Summary and Postpartum booklet. Activity: Advance as tolerated. Pelvic rest for 6 weeks.  Diet: routine diet Future Appointments: Future Appointments  Date Time Provider [place]  06/03/2021  3:10 PM Truett Mainland, DO [facility] None   Follow up Visit: Message sent to   Please schedule this patient for a In person postpartum visit in 6 weeks with the following provider: MD. Additional Postpartum F/U: None   High risk pregnancy complicated by: GDM Delivery mode:  Vaginal, Spontaneous  Anticipated Birth Control:  PP IUD placed  Mallie Snooks, MSN, CNM Certified Nurse Midwife, [employer] for [employer], [place] 05/02/21 7:33 AM

## 2021-04-30 NOTE — Anesthesia Procedure Notes (Signed)
Epidural Patient location during procedure: OB Start time: 04/30/2021 1:34 AM End time: 04/30/2021 1:51 AM  Staffing Anesthesiologist: Lidia Collum, MD Performed: anesthesiologist   Preanesthetic Checklist Completed: patient identified, IV checked, risks and benefits discussed, monitors and equipment checked, pre-op evaluation and timeout performed  Epidural Patient position: sitting Prep: DuraPrep Patient monitoring: heart rate, continuous pulse ox and blood pressure Approach: midline Location: L3-L4 Injection technique: LOR air  Needle:  Needle type: Tuohy  Needle gauge: 17 G Needle length: 9 cm Needle insertion depth: 7 cm Catheter type: closed end flexible Catheter size: 19 Gauge Catheter at skin depth: 12 cm Test dose: negative  Assessment Events: blood not aspirated, injection not painful, no injection resistance, no paresthesia and negative IV test  Additional Notes Reason for block:procedure for pain

## 2021-04-30 NOTE — Lactation Note (Signed)
This note was copied from a baby's chart. Lactation Consultation Note  Patient Name: Michele Armstrong [id number] Date: 04/30/2021 Reason for consult: Initial assessment;Mother's request;Early term 37-38.6wks;Infant < 6lbs;Maternal endocrine disorder Age:35 hours  Mom initially did not want to see LC from L and D. Mom working on latching but stated not able to wake infant to put him to breast since on the floor.   LC reviewed with mother behavior of LPTI under 6 lbs. Infant can be sleepy and need to feed based on cues 8-12x in 24 hr period no more than 3 hrs without an attempt. Mom to keep hat on all times, keep STS and keep total feeding under 30 min. LC provided LPTI handout. Infant has thin labial attachment and high palate. Infant some fluid in his mouth harder for him to latch with suck training or at the breast.  RN, Cathlean Cower, to set up DEBP and review pump parts, cleaning and assembly with mother.  Mom breasts are large with small nipples. LC reviewed with mother placing infant in sideline position with breast on pillow when latching.   Mom easy flowing colostrum. LC demonstrated to mother feeding on spoon if infant not able to latch.   Plan 1. To feed based on cues 8-12x in24 hr period no more than 3 hrs without an attempt. Mom to offer breast, STS and look for signs of milk transfer.  2. If unable to latch, infant take colostrum from spoon.  3. Mom to pump using DEBP q 3 hrs for 15 min. Mom to offer EBM via spoon or pace bottle feeding with slow flow nipple.  4. I and O sheet provided.  5. [place] brochure of inpatient and outpatient services provided.   All questions answered at the end of the visit.   Maternal Data Has patient been taught Hand Expression?: Yes Does the patient have breastfeeding experience prior to this delivery?: Yes How long did the patient breastfeed?: 3 years  Feeding Mother's Current Feeding Choice: Breast Milk  LATCH Score                     Lactation Tools Discussed/Used    Interventions Interventions: Breast feeding basics reviewed;Support pillows;Education;Assisted with latch;Skin to skin;Expressed milk;Breast massage;Hand express;Breast compression;DEBP;Adjust position  Discharge    Consult Status Consult Status: Follow-up Date: 05/01/21 Follow-up type: In-patient    Laloni Rowton  Nicholson-Springer 04/30/2021, 11:37 PM

## 2021-05-01 LAB — GLUCOSE, RANDOM: Glucose, Bld: 102 mg/dL — ABNORMAL HIGH (ref 70–99)

## 2021-05-01 NOTE — Progress Notes (Addendum)
POSTPARTUM PROGRESS NOTE  Post Partum Day 1  Subjective:  Michele Armstrong is a 35 y.o. [id number] s/p SVD at [redacted]w[redacted]d  She reports she is doing well. No acute events overnight. She denies any problems with ambulating, voiding or po intake. Denies nausea or vomiting.  Pain is moderately controlled.  Lochia is mild.  Objective: Blood pressure 106/70, pulse 69, temperature 98.2 F (36.8 C), temperature source Oral, resp. rate 18, height '5\' 4"'$  (1.626 m), weight 102.5 kg, last menstrual period 08/13/2020, SpO2 100 %, unknown if currently breastfeeding.  Physical Exam:  General: alert, cooperative and no distress Chest: no respiratory distress Heart:regular rate, distal pulses intact Abdomen: soft, nontender,  Uterine Fundus: firm, appropriately tender DVT Evaluation: No calf swelling or tenderness Extremities: no edema Skin: warm, dry  Recent Labs    04/29/21 0817  HGB 11.4*  HCT 35.1*    Assessment/Plan: Michele Armstrong a 35y.o. [id number]/p SVD at 335w1d PPD#1 - Doing well  Routine postpartum care #GDMA2: Fasting BG OOG, consider discharging on metformin Contraception: PP IUD placed Feeding: Breast and bottle feeding. Having difficulty with latch, lactation seeing patient Dispo: Plan for discharge PPD1-2.   LOS: 2 days   Michele Armstrong  05/01/2021, 7:25 AM  Midwife attestation Post Partum Day 1 I have seen and examined this patient and agree with above documentation in the resident's note.   Michele Armstrong a [age].o. [id number]/p vaginal delivery.  Pt denies problems with ambulating, voiding or po intake. Pain is well controlled.  Plan for birth control is  PP IUD placed .  Method of Feeding: breast and bottle  PE:  BP 119/88 (BP Location: Left Arm)   Pulse 83   Temp 98.1 F (36.7 C)   Resp 20   Ht '5\' 4"'$  (1.626 m)   Wt 102.5 kg   LMP 08/13/2020 (Exact Date)   SpO2 100%   Breastfeeding Unknown   BMI 38.79 kg/m  Gen: well appearing Heart: reg rate Lungs:  normal WOB Fundus firm Ext: soft, no pain, no edema  Plan for discharge: tomorrow. PP 2 hour GTT in office.   LiFatima BlankCNM 9:56 AM

## 2021-05-01 NOTE — Progress Notes (Signed)
Circumcision Consent  Discussed with mom at bedside about circumcision.   Circumcision is a surgery that removes the skin that covers the tip of the penis, called the "foreskin." Circumcision is usually done when a boy is between 72 and 68 days old, sometimes up to 72-8 weeks old.  The most common reasons boys are circumcised include for cultural/religious beliefs or for parental preference (potentially easier to clean, so baby looks like daddy, etc).  There may be some medical benefits for circumcision:   Circumcised boys seem to have slightly lower rates of: ? Urinary tract infections (per the American Academy of Pediatrics an uncircumcised boy has a 1/100 chance of developing a UTI in the first year of life, a circumcised boy at a 10/998 chance of developing a UTI in the first year of life- a 10% reduction) ? Penis cancer (typically rare- an uncircumcised female has a 1 in 100,000 chance of developing cancer of the penis) ? Sexually transmitted infection (in endemic areas, including HIV, HPV and Herpes- circumcision does NOT protect against gonorrhea, chlamydia, trachomatis, or syphilis) ? Phimosis: a condition where that makes retraction of the foreskin over the glans impossible (0.4 per 1000 boys per year or 0.6% of boys are affected by their 15th birthday)  Boys and men who are not circumcised can reduce these extra risks by: ? Cleaning their penis well ? Using condoms during sex  What are the risks of circumcision?  As with any surgical procedure, there are risks and complications. In circumcision, complications are rare and usually minor, the most common being: ? Bleeding- risk is reduced by holding each clamp for 30 seconds prior to a cut being made, and by holding pressure after the procedure is done ? Infection- the penis is cleaned prior to the procedure, and the procedure is done under sterile technique ? Damage to the urethra or amputation of the penis  How is circumcision done  in baby boys?  The baby will be placed on a special table and the legs restrained for their safety. Numbing medication is injected into the penis, and the skin is cleansed with betadine to decrease the risk of infection.   What to expect:  The penis will look red and raw for 5-7 days as it heals. We expect scabbing around where the cut was made, as well as clear-pink fluid and some swelling of the penis right after the procedure. If your baby's circumcision starts to bleed or develops pus, please contact your pediatrician immediately.  All questions were answered and mother consented.  Fatima Blank, CNM 9:49 AM

## 2021-05-01 NOTE — Progress Notes (Deleted)
Post Partum Day 1 Subjective: no complaints, up ad lib, voiding, tolerating PO, and + flatus  Objective: Blood pressure 119/88, pulse 83, temperature 98.1 F (36.7 C), resp. rate 20, height '5\' 4"'$  (1.626 m), weight 102.5 kg, last menstrual period 08/13/2020, SpO2 100 %, unknown if currently breastfeeding.  Physical Exam:  General: alert, cooperative, and no distress Lochia: appropriate Uterine Fundus: firm Incision: n/a DVT Evaluation: No evidence of DVT seen on physical exam.  Recent Labs    04/29/21 0817  HGB 11.4*  HCT 35.1*    Assessment/Plan: Plan for discharge tomorrow, Breastfeeding, Social Work consult, and Contraception PP IUD placed   LOS: 2 days   Fatima Blank 05/01/2021, 9:48 AM

## 2021-05-01 NOTE — Lactation Note (Signed)
This note was copied from a baby's chart. Lactation Consultation Note  Patient Name: Michele Armstrong [id number] Date: 05/01/2021 Reason for consult: Mother's request;Difficult latch Age:35 hours  LC in to room per mother's request. Mother states latching and pumping are going well. Mother reports right nipple inverts with compression or expression. Noted exceedingly large, soft breasts with some edema. Nipples WNL. LC demonstrated reverse pressure around right nipple. Observed pitting.  Mother explains she does not own bras due to breast size. LC provided a white stretchy binder top to hold shells. Educated about taking off shells when pumping, breastfeeding and sleeping. Mother agrees. Urged to contact [facility] for any needs or concerns.      Feeding Mother's Current Feeding Choice: Breast Milk  LATCH Score Latch: Repeated attempts needed to sustain latch, nipple held in mouth throughout feeding, stimulation needed to elicit sucking reflex.  Audible Swallowing: A few with stimulation  Type of Nipple: Everted at rest and after stimulation  Comfort (Breast/Nipple): Soft / non-tender  Hold (Positioning): Assistance needed to correctly position infant at breast and maintain latch.  LATCH Score: 7   Lactation Tools Discussed/Used Tools: Pump Breast pump type: Double-Electric Breast Pump Reason for Pumping: supplementation Pumping frequency: as needed Pumped volume: 35 mL  Interventions Interventions: Shells;DEBP;Education;Reverse pressure  Discharge Discharge Education: Engorgement and breast care Pump: Personal;DEBP;Manual  Consult Status Consult Status: Follow-up Date: 05/02/21 Follow-up type: In-patient    Lejuan Botto A Higuera Ancidey 05/01/2021, 7:06 PM
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
## 2021-05-01 NOTE — Anesthesia Postprocedure Evaluation (Signed)
Anesthesia Post Note  Patient: Leisure centre manager  Procedure(s) Performed: AN AD HOC LABOR EPIDURAL     Patient location during evaluation: Mother Baby Anesthesia Type: Epidural Level of consciousness: awake, oriented and awake and alert Pain management: pain level controlled Vital Signs Assessment: post-procedure vital signs reviewed and stable Respiratory status: spontaneous breathing, respiratory function stable and nonlabored ventilation Cardiovascular status: stable Postop Assessment: adequate PO intake, no headache, patient able to bend at knees, able to ambulate and no apparent nausea or vomiting Anesthetic complications: no   No notable events documented.  Last Vitals:  Vitals:   05/01/21 0408 05/01/21 0745  BP: 106/70 119/88  Pulse: 69 83  Resp: 18 20  Temp: 36.8 C 36.7 C  SpO2: 100%     Last Pain:  Vitals:   05/01/21 0745  TempSrc:   PainSc: 2    Pain Goal: Patients Stated Pain Goal: 6 (04/29/21 2041)                 Devyon Keator

## 2021-05-02 MED ORDER — ACETAMINOPHEN 325 MG PO TABS: 650.0000 mg | ORAL_TABLET | ORAL | 0 refills | Status: AC | PRN

## 2021-05-02 MED ORDER — IBUPROFEN 600 MG PO TABS
600.0000 mg | ORAL_TABLET | Freq: Four times a day (QID) | ORAL | 0 refills | Status: DC
Start: 2021-05-02 — End: 2021-06-10

## 2021-05-02 MED ORDER — HYDROXYZINE HCL 10 MG PO TABS
10.0000 mg | ORAL_TABLET | Freq: Once | ORAL | Status: AC
  Administered 2021-05-02: 10 mg via ORAL
  Filled 2021-05-02: qty 1

## 2021-05-02 MED ORDER — CLONAZEPAM 0.5 MG PO TABS: 0.5000 mg | ORAL_TABLET | Freq: Two times a day (BID) | ORAL | 0 refills | Status: AC | PRN

## 2021-05-02 NOTE — Progress Notes (Signed)
RN request CSW to speak with MOB, in regards to last night panic attack. MOB reported, last night infant was tearful, and both were not able to sleep. MOB reported, prior to pregnancy,  psychiatry prescribed her Klonopin to relieve symptoms. MOB agreed, to resume Klonopin prior to d/c.  Darcus Austin, MSW, LCSW-A Clinical Social Worker- Weekends [phone]

## 2021-05-02 NOTE — Lactation Note (Signed)
This note was copied from a baby's chart. Lactation Consultation Note  Patient Name: Michele Armstrong [id number] Date: 05/02/2021 Reason for consult: Follow-up assessment;Mother's request;Early term 37-38.6wks;Infant < 6lbs Age:35 years Called to mom's room to assist with pumping, mom c/o milk not expressing from left breast x2. Mom reports current pump session has been going for 74mns. Mom tearful and concerned lack of milk expressing will result in low milk supply. Mom obtained 515mEBM from right.  Per request mom fitted with 2135m53m8mnd 27mm48mnge, advised 21mm 61mtoo small, recommended use 27mm. 8m parts are all working appropriately.   Mom reports a h/o Raynaud's Syndrome (nipples turning purple-black when going from warm shower to cold room). Advised lack of milk expression may be r/t syndrome as well as current emotional state. Reinforced milk storage guidelines.  Plan - feed on cue 8-12 times a day - warm compress to nipples for 5mins p3mr to pumping - pump x15mins w13mwarm compress on left breast - wash pump parts after q use - mom to rest, eat, hydrate, positive thoughts while pumping  Mom voiced understanding and with no further concerns. Left the room with mom resting in bed, MGM on couch, baby  asleep in bassinet swaddled by biliblanket. BGilliam, RN, IBCLC    Feeding Mother's Current Feeding Choice: Breast Milk and Formula  LATCH Score Latch: Grasps breast easily, tongue down, lips flanged, rhythmical sucking.  Audible Swallowing: A few with stimulation  Type of Nipple: Everted at rest and after stimulation  Comfort (Breast/Nipple): Soft / non-tender  Hold (Positioning): Assistance needed to correctly position infant at breast and maintain latch.  LATCH Score: 8     Interventions Interventions: DEBP;Breast feeding basics reviewed   Consult Status Consult Status: Follow-up Date: 05/03/21 Follow-up type: In-patient    Michele Armstrong W Michele Armstrong Buffy2, 3:16 PM

## 2021-05-02 NOTE — Progress Notes (Signed)
Patient requests refill of Klonopin. Reports last took it prior to pregnancy 0.'5mg'$  PRN. Patient has psychiatrist DR. Jessy Oto (at [place]) who usually prescribes. Reports will be reaching out via MyChart to request refill  At time of discussion patient experiencing panic symptoms and anxiety due to stress of baby's status (baby needing bili lights and under bili blanket in patient's arm during discussion). Discussed patient can keep taking Hydroxyzine as needed for anxiety. She feels it takes edge off but her feeling of panic is still present.  Provided reassurance and support. Discussed Klonopin can be used in low doses while breastfeeding however there is a risk of infant sedation. Upon shared decision making a short course (10 pills) of Klonopin sent to patient's pharmacy.   Renard Matter, MD, MPH OB Fellow, Faculty [facility]

## 2021-05-02 NOTE — Lactation Note (Signed)
This note was copied from a baby's chart. Lactation Consultation Note  Patient Name: Michele Armstrong [id number] Date: 05/02/2021 Reason for consult: Follow-up assessment;Early term 37-38.6wks;Infant < 6lbs Age:35 hours  Mom sitting in bed pumping, MGM holding sleeping baby. Mom reports with tough night, baby cluster fed, feedings are ~1hr long, baby not taking bottle unsure if because of nipple type. Mom reports sending baby to nursery overnight d/t rough night. Mom obtained 35m with this pump session and is concerned about decreased volume from yesterday.  Peds in to see mom, advised will check bili levels, speech to see baby, and switch to 22cal formula for supplementation with a goal of 15-268msupplementation per feeding.   Mom with everted nipples bilat that are free of damage. Mom latched baby to right breast cross cradle, noted active sucks with swallows x1551m LC assisted mom with giving 5ml83mM via bottle, baby consumed within 5min8molerated well. Mom gave 22cal formula via bottle (8ml),42mby tolerated well. LC attempted to offer more, baby uninterested, milk coming out of baby's mouth.  SW in to see mom to discuss emotional state last night and devise plan.  RN in to setup biliblanket, mom tearful.  Plan - feed on cue 8-12 times a day - limit nursing to 20mins31msupplement EBM and FO via bottle each feeding (per peds ~10ml of20m 22cal FO) - keep total feeding to ~30mins -55mp after/ before feeds - wake if >3hrs since last feeding - monitor stools and voids as indicators of adequate feedings - discontinue use of breast shells - continuous skin to skin once phototherapy d/c - wash pump parts after each pumping - call for lactation support if needed prior to next visit - will update feeding plan as needed on next visit  Mom voiced understanding and with no further concerns. Left the room with baby swaddled by biliblanket in bassinet. Mom crying to MGM regar[facility]g baby needing  phototherapy. BGilliam, RN, IBCLC  Feeding Mother's Current Feeding Choice: Breast Milk and Formula Nipple Type: Extra Slow Flow  LATCH Score Latch: Grasps breast easily, tongue down, lips flanged, rhythmical sucking.  Audible Swallowing: A few with stimulation  Type of Nipple: Everted at rest and after stimulation  Comfort (Breast/Nipple): Soft / non-tender  Hold (Positioning): Assistance needed to correctly position infant at breast and maintain latch.  LATCH Score: 8   Lactation Tools Discussed/Used Tools: Bottle;Pump Breast pump type: Double-Electric Breast Pump Reason for Pumping: supplementation Pumping frequency: q feedings Pumped volume: 5 mL  Interventions Interventions: Breast feeding basics reviewed;Assisted with latch;Skin to skin;Support pillows;Expressed milk;DEBP    Consult Status Consult Status: Follow-up Date: 05/03/21 Follow-up type: In-patient    Shuntay Everetts W Bernita Buffy2, 11:56 AM

## 2021-05-03 ENCOUNTER — Other Ambulatory Visit: Payer: Self-pay

## 2021-05-03 ENCOUNTER — Ambulatory Visit: Payer: Self-pay

## 2021-05-03 NOTE — Lactation Note (Signed)
This note was copied from a baby's chart. Lactation Consultation Note  Patient Name: Boy Celeste Gong [id number] Date: 05/03/2021 Reason for consult: Follow-up assessment;Early term 37-38.6wks Age:35 hours  LC in to room for follow up. Mother shares her excitement due to 20 mL of EBM collected after pumping. Mother explains she is breastfeeding for ~10 minutes, offering EBM and then supplementing with formula. Last feeding infant got ~ 35 mL total of supplementation. Mother expresses her joy because bili levels have improved. Discussed appropriate volume of supplementation according to age and encouraged to offer more when infant is actively interested.  LC reinforced the importance of self-care. Provided some coconut oil for nipple sensitivity.   Feeding Mother's Current Feeding Choice: Breast Milk and Formula  Lactation Tools Discussed/Used Breast pump type: Double-Electric Breast Pump Pumped volume: 20 mL  Interventions Interventions: Education;Expressed milk;DEBP;Coconut oil  Consult Status Consult Status: Follow-up Date: 05/04/21 Follow-up type: In-patient    Fedora Knisely A Higuera Ancidey 05/03/2021, 10:45 PM

## 2021-05-03 NOTE — Lactation Note (Signed)
This note was copied from a baby's chart. Lactation Consultation Note  Patient Name: Michele Armstrong [id number] Date: 05/03/2021 Reason for consult: Follow-up assessment;Infant < 6lbs;Early term 37-38.6wks;Hyperbilirubinemia Age:35 hours   P2 mother whose infant is now 71 hours old.  This is an ETI at 37+1 weeks weighing < 6 lbs.  Baby is under phototherapy with bili blanket and bank light.  Weight loss is 10% this morning.  Mother was pumping when I arrived; very tearful and stressed.  Provided emotional support to attempt to calm mother's anxiety level.  She is very concerned with her baby's condition and wanted to know if he was going to "get better and turn around."  Reviewed his current medical situation regarding the phototherapy and weight loss.  Reviewed key concepts about the early term infant to help mother and grandmother understand the plan of care.    Reviewed the importance of keeping the total feeding time to less than 30 minutes.  It appears that mother has been breast feeding longer durations and, at times, having difficulty with a routine for feeding.  Grandmother has been formula feeding the supplementation while mother pumps.  Praised them for working together with this.  Grandmother reported that baby has been "all over" with the biliblanket and not staying calm on the blanket.  Placed him inside blanket rolls with support to keep him more contained.  Eye patches in place.  Mother finished pumping and was able to obtain 9 mls of EBM.  Demonstrated paced bottle feeding and he consumed this amount easily; burped and fell asleep.   Encouraged 30 mls of supplementation as the day progresses and to call for any difficulties with feeding.    Mother will need continued reassurance and emotional support; grandmother very helpful.  Suggested family ask for some "quiet time" today this afternoon in order to obtain some quality rest.  Discussed importance of rest, hydration and proper  nutrition.  Family appreciative.  RN updated.    Maternal Data Has patient been taught Hand Expression?: Yes  Feeding Mother's Current Feeding Choice: Breast Milk and Formula Nipple Type: Extra Slow Flow  LATCH Score                    Lactation Tools Discussed/Used Tools: Pump;Flanges Flange Size: 24 Breast pump type: Double-Electric Breast Pump;Manual Pump Education: Setup, frequency, and cleaning (Reviewed) Reason for Pumping: ETI with hyperbilirubenia Pumping frequency: Every three hours Pumped volume: 9 mL  Interventions    Discharge    Consult Status Consult Status: Follow-up Date: 05/04/21 Follow-up type: In-patient    Duran Ohern R Froilan Mclean 05/03/2021, 5:34 AM

## 2021-05-03 NOTE — Lactation Note (Signed)
This note was copied from a baby's chart. Lactation Consultation Note  Patient Name: Michele Armstrong [id number] Date: 05/03/2021 Reason for consult: Follow-up assessment Age:35 hours   LC Follow Up Consult:  Followed up with mother for emotional support and reassurance from my 0450 visit this a.m.  Mother is feeling much better now since the phototherapy was discontinued this morning.  She is now in a good routine with feeding, supplementing and pumping.  Observed her finishing a 15 minutes pumping session where she was able to obtain 15 mls of EBM.  Mother was so excited!  Praised her for her hard work with caring for her baby and pumping.  Grandmother not currently here but has been a good support for mother.    Encouraged mother to continue focusing on providing the appropriate supplementation and less time on trying to obtain good breast feeding sessions.  Reiterated the importance of calories and nourishment to help minimize further weight loss (10% this a.m.) She will limit total feeding time to < 30 minutes.  Mother verbalized understanding.  Suggested she rest now before baby needs to feed again.  Mother appreciative.   Maternal Data    Feeding    LATCH Score                    Lactation Tools Discussed/Used    Interventions    Discharge    Consult Status Consult Status: Follow-up Date: 05/04/21 Follow-up type: In-patient    Ramona Ruark R Monick Rena 05/03/2021, 2:09 PM

## 2021-05-06 ENCOUNTER — Encounter: Payer: Medicaid Other | Admitting: Obstetrics and Gynecology

## 2021-05-10 ENCOUNTER — Other Ambulatory Visit: Payer: Self-pay

## 2021-05-14 ENCOUNTER — Telehealth (HOSPITAL_COMMUNITY): Payer: Self-pay

## 2021-05-14 NOTE — Telephone Encounter (Signed)
Discharge follow-up call, left message with return contact number.

## 2021-06-03 ENCOUNTER — Ambulatory Visit: Payer: Medicaid Other | Admitting: Family Medicine

## 2021-06-10 ENCOUNTER — Ambulatory Visit (INDEPENDENT_AMBULATORY_CARE_PROVIDER_SITE_OTHER): Payer: Medicaid Other | Admitting: Family Medicine

## 2021-06-10 ENCOUNTER — Encounter: Payer: Self-pay | Admitting: Family Medicine

## 2021-06-10 ENCOUNTER — Other Ambulatory Visit: Payer: Self-pay

## 2021-06-10 DIAGNOSIS — Z8632 Personal history of gestational diabetes: Secondary | ICD-10-CM | POA: Diagnosis not present

## 2021-06-10 DIAGNOSIS — F319 Bipolar disorder, unspecified: Secondary | ICD-10-CM | POA: Diagnosis not present

## 2021-06-10 DIAGNOSIS — N393 Stress incontinence (female) (male): Secondary | ICD-10-CM | POA: Diagnosis not present

## 2021-06-10 DIAGNOSIS — E038 Other specified hypothyroidism: Secondary | ICD-10-CM

## 2021-06-10 DIAGNOSIS — F4310 Post-traumatic stress disorder, unspecified: Secondary | ICD-10-CM

## 2021-06-10 NOTE — Progress Notes (Signed)
Post Partum Visit Note  Michele Armstrong is a 35 y.o. G7P2012 female who presents for a postpartum visit. She is 5 weeks postpartum following a normal spontaneous vaginal delivery.  I have fully reviewed the prenatal and intrapartum course. The delivery was at 54 gestational weeks.  Anesthesia: epidural. Postpartum course has been normal. Baby is doing well. Baby is feeding by both breast and bottle - Neosure  . Bleeding staining only. Bowel function is normal. Bladder function is normal. Patient is not sexually active. Contraception method is IUD. Postpartum depression screening: negative.   The pregnancy intention screening data noted above was reviewed. Potential methods of contraception were discussed. The patient elected to proceed with No data recorded.   Edinburgh Postnatal Depression Scale - 06/10/21 0854       Edinburgh Postnatal Depression Scale:  In the Past 7 Days   I have been able to laugh and see the funny side of things. 0    I have looked forward with enjoyment to things. 0    I have blamed myself unnecessarily when things went wrong. 2    I have been anxious or worried for no good reason. 1    I have felt scared or panicky for no good reason. 2    Things have been getting on top of me. 1    I have been so unhappy that I have had difficulty sleeping. 0    I have felt sad or miserable. 0    I have been so unhappy that I have been crying. 0    The thought of harming myself has occurred to me. 0    Edinburgh Postnatal Depression Scale Total 6             Health Maintenance Due  Topic Date Due   Pneumococcal Vaccine 1-86 Years old (1 - PCV) Never done   URINE MICROALBUMIN  Never done   COVID-19 Vaccine (4 - Booster for Pfizer series) 12/21/2020   INFLUENZA VACCINE  05/03/2021    The following portions of the patient's history were reviewed and updated as appropriate: allergies, current medications, past family history, past medical history, past social history,  past surgical history, and problem list.  Review of Systems Pertinent items are noted in HPI.  Objective:  BP 106/72   Pulse 82   Wt 219 lb (99.3 kg)   Breastfeeding Yes   BMI 37.59 kg/m    General:  alert, cooperative, and no distress   Breasts:  not indicated  Lungs: clear to auscultation bilaterally  Heart:  regular rate and rhythm, S1, S2 normal, no murmur, click, rub or gallop  Abdomen: soft, non-tender; bowel sounds normal; no masses,  no organomegaly   Wound N/a  GU exam:  normal. IUD strings visualized       Assessment:   1. Postpartum care and examination   2. History of gestational diabetes   3. Bipolar 1 disorder ([place])   4. Stress incontinence, female   5. PTSD (post-traumatic stress disorder)   6. Other specified hypothyroidism      Plan:   Essential components of care per ACOG recommendations:  1.  Mood and well being: Patient with negative depression screening today. Reviewed local resources for support.  - Patient tobacco use? No.   - hx of drug use? No.    2. Infant care and feeding:  -Patient currently breastmilk feeding? Yes. Reviewed importance of draining breast regularly to support lactation.  -Social determinants of health (SDOH)  reviewed in EPIC. No concerns  3. Sexuality, contraception and birth spacing - Patient does not want a pregnancy in the next year.   - Reviewed forms of contraception in tiered fashion. Patient desired IUD today.   - Discussed birth spacing of 18 months  4. Sleep and fatigue -Encouraged family/partner/community support of 4 hrs of uninterrupted sleep to help with mood and fatigue  5. Physical Recovery  - Discussed patients delivery and complications. She describes her labor as good. - Patient had a Vaginal, no problems at delivery. Patient had  no  laceration. Perineal healing reviewed. Patient expressed understanding - Patient has urinary incontinence? Yes - history of incontinence following last delivery. Using  biofeedback for next 6 months, then will reassess. - Patient is safe to resume physical and sexual activity  6.  Health Maintenance - HM due items addressed Yes - Last pap smear  Diagnosis  Date Value Ref Range Status  04/02/2020   Final   - Negative for intraepithelial lesion or malignancy (NILM)   Pap smear not done at today's visit.  -Breast Cancer screening indicated? No.   7. Chronic Disease/Pregnancy Condition follow up: Hypothyroidism Check TSH today.  - PCP follow up  Andrew for [facility]

## 2021-06-11 LAB — GLUCOSE TOLERANCE, 2 HOURS
Glucose, 2 hour: 97 mg/dL (ref 65–139)
Glucose, GTT - Fasting: 95 mg/dL (ref 65–99)

## 2021-06-11 LAB — TSH: TSH: 4.69 u[IU]/mL — ABNORMAL HIGH (ref 0.450–4.500)

## 2021-06-14 ENCOUNTER — Emergency Department (HOSPITAL_COMMUNITY)
Admission: EM | Admit: 2021-06-14 | Discharge: 2021-06-15 | Disposition: A | Discharge status: Home / Self Care | Payer: Medicaid Other | Attending: Emergency Medicine | Admitting: Emergency Medicine

## 2021-06-14 ENCOUNTER — Other Ambulatory Visit: Payer: Self-pay

## 2021-06-14 ENCOUNTER — Emergency Department (HOSPITAL_COMMUNITY): Payer: Medicaid Other

## 2021-06-14 ENCOUNTER — Encounter (HOSPITAL_COMMUNITY): Payer: Self-pay

## 2021-06-14 DIAGNOSIS — R1031 Right lower quadrant pain: Secondary | ICD-10-CM | POA: Diagnosis not present

## 2021-06-14 DIAGNOSIS — Z20822 Contact with and (suspected) exposure to covid-19: Secondary | ICD-10-CM | POA: Insufficient documentation

## 2021-06-14 DIAGNOSIS — Z87891 Personal history of nicotine dependence: Secondary | ICD-10-CM | POA: Insufficient documentation

## 2021-06-14 DIAGNOSIS — E039 Hypothyroidism, unspecified: Secondary | ICD-10-CM | POA: Diagnosis not present

## 2021-06-14 DIAGNOSIS — Z91040 Latex allergy status: Secondary | ICD-10-CM | POA: Diagnosis not present

## 2021-06-14 DIAGNOSIS — R112 Nausea with vomiting, unspecified: Secondary | ICD-10-CM | POA: Insufficient documentation

## 2021-06-14 LAB — COMPREHENSIVE METABOLIC PANEL
ALT: 16 U/L (ref 0–44)
AST: 19 U/L (ref 15–41)
Albumin: 3.8 g/dL (ref 3.5–5.0)
Alkaline Phosphatase: 100 U/L (ref 38–126)
Anion gap: 13 (ref 5–15)
BUN: 8 mg/dL (ref 6–20)
CO2: 18 mmol/L — ABNORMAL LOW (ref 22–32)
Calcium: 9 mg/dL (ref 8.9–10.3)
Chloride: 108 mmol/L (ref 98–111)
Creatinine, Ser: 0.76 mg/dL (ref 0.44–1.00)
GFR, Estimated: >60 mL/min (ref >60)
Glucose, Bld: 101 mg/dL — ABNORMAL HIGH (ref 70–99)
Potassium: 4.1 mmol/L (ref 3.5–5.1)
Sodium: 139 mmol/L (ref 135–145)
Total Bilirubin: 0.8 mg/dL (ref 0.3–1.2)
Total Protein: 7 g/dL (ref 6.5–8.1)

## 2021-06-14 LAB — CBC
HCT: 46.1 % — ABNORMAL HIGH (ref 36.0–46.0)
Hemoglobin: 14.5 g/dL (ref 12.0–15.0)
MCH: 29 pg (ref 26.0–34.0)
MCHC: 31.5 g/dL (ref 30.0–36.0)
MCV: 92.2 fL (ref 80.0–100.0)
Platelets: 344 10*3/uL (ref 150–400)
RBC: 5 MIL/uL (ref 3.87–5.11)
RDW: 15.5 % (ref 11.5–15.5)
WBC: 18.4 10*3/uL — ABNORMAL HIGH (ref 4.0–10.5)
nRBC: 0 % (ref 0.0–0.2)

## 2021-06-14 LAB — I-STAT BETA HCG BLOOD, ED (MC, WL, AP ONLY): I-stat hCG, quantitative: <5 m[IU]/mL (ref <5)

## 2021-06-14 LAB — LIPASE, BLOOD: Lipase: 36 U/L (ref 11–51)

## 2021-06-14 MED ORDER — ONDANSETRON HCL 4 MG/2ML IJ SOLN
4.0000 mg | Freq: Once | INTRAMUSCULAR | Status: AC
  Administered 2021-06-14: 4 mg via INTRAVENOUS
  Filled 2021-06-14: qty 2

## 2021-06-14 MED ORDER — IOHEXOL 350 MG/ML SOLN
80.0000 mL | Freq: Once | INTRAVENOUS | Status: AC | PRN
  Administered 2021-06-14: 80 mL via INTRAVENOUS

## 2021-06-14 MED ORDER — HYDROMORPHONE HCL 1 MG/ML IJ SOLN
0.5000 mg | Freq: Once | INTRAMUSCULAR | Status: AC
  Administered 2021-06-14: 0.5 mg via INTRAVENOUS
  Filled 2021-06-14: qty 1

## 2021-06-14 NOTE — ED Notes (Signed)
Patient returned from CT

## 2021-06-14 NOTE — ED Notes (Signed)
Pt complaining of severe abdominal pain. Vitals rechecked.

## 2021-06-14 NOTE — ED Provider Notes (Signed)
Emergency Medicine Provider Triage Evaluation Note  Michele Armstrong , a 35 y.o. female  was evaluated in triage.  Pt complains of right lower quadrant abdominal pain.  Patient says it started 2 days ago.  Is gradually worsening.  She has had associated nausea and vomiting.  Denies diarrhea, chest pain, shortness of breath.  She says she should not be pregnant as she has an IUD in.  She recently gave birth.  Denies fever and chills..  Review of Systems  Positive: Right lower quadrant abdominal pain.  Nausea, vomiting. Negative: Fever, chills, chest pain, shortness of breath  Physical Exam  BP (!) 131/97 (BP Location: Right Arm)   Pulse 100   Temp 98.6 F (37 C) (Oral)   Resp 20   Ht '5\' 4"'$  (1.626 m)   Wt 99.8 kg   SpO2 100%   BMI 37.76 kg/m  Gen:   Awake, in acute distress Resp:  Normal effort MSK:   Moves extremities without difficulty Other:  Right lower quadrant tenderness to palpation at McBurney's point.  Not peritonitic.  Order CT scan.  Medical Decision Making  Medically screening exam initiated at 8:55 PM.  Appropriate orders placed.  Cieanna Stephens was informed that the remainder of the evaluation will be completed by another provider, this initial triage assessment does not replace that evaluation, and the importance of remaining in the ED until their evaluation is complete.    Sheila Oats 06/14/21 2057    Dorie Rank, MD 06/16/21 [phone]

## 2021-06-14 NOTE — ED Notes (Signed)
Patient transported to CT 

## 2021-06-14 NOTE — ED Triage Notes (Signed)
Pt is crying and is doubled over in pain, hurts to touch the lower abdomen

## 2021-06-14 NOTE — ED Provider Notes (Signed)
[facility] DEPARTMENT Provider Note   CSN: [id number] Arrival date & time: 06/14/21  1834     History Chief Complaint  Patient presents with   Abdominal Pain    Pt complaining of pain in the rt lower quadrant of abdomen, started yesterday and worse now, had a bowel movement this am, still has appendix but no gall bladder    Michele Armstrong is a 35 y.o. female.  Patient with history of hypothyroid, PTSD, cholecystectomy presents with RLQ pain for the past 3 days. No know fever. She reports nausea and vomiting. No urinary or vaginal symptoms. No chest pain or SOB.   The history is provided by the patient. No language interpreter was used.  Abdominal Pain Associated symptoms: nausea and vomiting   Associated symptoms: no chills, no constipation, no diarrhea, no dysuria, no fever and no vaginal discharge       Past Medical History:  Diagnosis Date   Anxiety    Depression    Fatigue    Gall stone    Gestational diabetes    Headache(784.0)    Hypothyroid    Internal hemorrhoids    PTSD (post-traumatic stress disorder)    tachycardia    tachycardia    Patient Active Problem List   Diagnosis Date Noted   Adjustment disorder with mixed anxiety and depressed mood 01/05/2021   Bipolar 1 disorder ([place]) 04/21/2020   Elevated LFTs 03/26/2020   Obesity, Class III, BMI 40-49.9 (morbid obesity) ([place]) 01/23/2020   Gastroesophageal reflux disease 05/30/2018   Hemorrhoids 05/30/2018   Stress incontinence, female 10/27/2017   Nevus of right shoulder 10/24/2017   Chronic back pain 08/28/2017   Chronic pain of both knees 08/28/2017   Insomnia 08/28/2017   Prolapsed internal hemorrhoids, grade 2 06/07/2017   Latex allergy 06/07/2017   Large breasts 01/10/2017   Pain aggravated by breast feeding 01/10/2017   PTSD (post-traumatic stress disorder) 08/22/2011   Depression, major, recurrent ([place]) 08/22/2011   Hypothyroid 08/22/2011    Past Surgical  History:  Procedure Laterality Date   CHOLECYSTECTOMY     FLEXIBLE SIGMOIDOSCOPY N/A 06/01/2017   Procedure: FLEXIBLE SIGMOIDOSCOPY;  Surgeon: Milus Banister, MD;  Location: [facility];  Service: Endoscopy;  Laterality: N/A;   HEMORRHOID BANDING  2018   WISDOM TOOTH EXTRACTION       OB History     Gravida  3   Para  2   Term  2   Preterm      AB  1   Living  2      SAB      IAB      Ectopic      Multiple  0   Live Births  2           Family History  Problem Relation Age of Onset   Alcohol abuse Father    Drug abuse Father    Hypertension Father    Hypertension Mother    Diabetes Mother    Leukemia Maternal Grandmother    Breast cancer Maternal Aunt    Colon cancer Neg Hx    Stomach cancer Neg Hx     Social History   Tobacco Use   Smoking status: Former    Types: Cigarettes    Quit date: 01/01/2021    Years since quitting: 0.4   Smokeless tobacco: Never   Tobacco comments:    quit smoking about 2 days ago  Vaping Use   Vaping Use: Former  Substances: Nicotine, Flavoring  Substance Use Topics   Alcohol use: Not Currently   Drug use: Not Currently    Types: Marijuana    Home Medications Prior to Admission medications   Medication Sig Start Date End Date Taking? Authorizing Provider  cetirizine (ZYRTEC) 10 MG tablet Take 10 mg by mouth daily.    [provider]  clonazePAM (KLONOPIN) 0.5 MG tablet Take 1 tablet (0.5 mg total) by mouth 2 (two) times daily as needed for up to 5 days for anxiety. 05/02/21 05/07/21  Renard Matter, MD  DULoxetine (CYMBALTA) 60 MG capsule Take 60 mg by mouth daily.    [provider]  lamoTRIgine (LAMICTAL) 25 MG tablet Take 25 mg by mouth daily.    [provider]  levothyroxine (SYNTHROID) 100 MCG tablet Take 1 tablet (100 mcg total) by mouth daily before breakfast. 12/01/20   Truett Mainland, DO  Prenat-FeAsp-Meth-FA-DHA w/o A (PRENATE PIXIE) 10-0.6-0.4-200 MG CAPS Take 1 capsule by mouth  daily.    [provider]    Allergies    Morphine and related and Latex  Review of Systems   Review of Systems  Constitutional:  Negative for chills and fever.  HENT: Negative.    Respiratory: Negative.    Cardiovascular: Negative.   Gastrointestinal:  Positive for abdominal pain, nausea and vomiting. Negative for constipation and diarrhea.  Genitourinary:  Negative for dysuria and vaginal discharge.  Musculoskeletal: Negative.  Negative for back pain.  Skin: Negative.   Neurological: Negative.    Physical Exam Updated Vital Signs BP 110/73   Pulse 78   Temp 98.6 F (37 C) (Oral)   Resp 12   Ht '5\' 4"'$  (1.626 m)   Wt 99.8 kg   SpO2 97%   BMI 37.76 kg/m   Physical Exam Vitals and nursing note reviewed.  Constitutional:      Appearance: She is well-developed. She is not ill-appearing.  Cardiovascular:     Rate and Rhythm: Normal rate and regular rhythm.     Heart sounds: No murmur heard. Pulmonary:     Effort: Pulmonary effort is normal.     Breath sounds: No rhonchi or rales.  Chest:     Chest wall: No tenderness.  Abdominal:     Palpations: Abdomen is soft.     Tenderness: There is abdominal tenderness in the right lower quadrant.  Skin:    General: Skin is warm and dry.  Neurological:     Mental Status: She is alert.    ED Results / Procedures / Treatments   Labs (all labs ordered are listed, but only abnormal results are displayed) Labs Reviewed  WET PREP, GENITAL - Abnormal; Notable for the following components:      Result Value   WBC, Wet Prep HPF POC MANY (*)    All other components within normal limits  COMPREHENSIVE METABOLIC PANEL - Abnormal; Notable for the following components:   CO2 18 (*)    Glucose, Bld 101 (*)    All other components within normal limits  CBC - Abnormal; Notable for the following components:   WBC 18.4 (*)    HCT 46.1 (*)    All other components within normal limits  URINALYSIS, ROUTINE W REFLEX MICROSCOPIC -  Abnormal; Notable for the following components:   Specific Gravity, Urine 1.045 (*)    All other components within normal limits  RESP PANEL BY RT-PCR (FLU A&B, COVID) ARPGX2  LIPASE, BLOOD  I-STAT BETA HCG BLOOD, ED ([facility],  [facility], [facility] ONLY)  GC/CHLAMYDIA PROBE AMP ([place]) NOT AT [facility]   Results for orders placed or performed during the hospital encounter of 06/14/21  Resp Panel by RT-PCR (Flu A&B, Covid) Nasopharyngeal Swab   Specimen: Nasopharyngeal Swab; Nasopharyngeal(NP) swabs in vial transport medium  Result Value Ref Range   SARS Coronavirus 2 by RT PCR NEGATIVE NEGATIVE   Influenza A by PCR NEGATIVE NEGATIVE   Influenza B by PCR NEGATIVE NEGATIVE  Wet prep, genital   Specimen: PATH Cytology Cervicovaginal Ancillary Only  Result Value Ref Range   Yeast Wet Prep HPF POC NONE SEEN NONE SEEN   Trich, Wet Prep NONE SEEN NONE SEEN   Clue Cells Wet Prep HPF POC NONE SEEN NONE SEEN   WBC, Wet Prep HPF POC MANY (A) NONE SEEN   Sperm NONE SEEN   Lipase, blood  Result Value Ref Range   Lipase 36 11 - 51 U/L  Comprehensive metabolic panel  Result Value Ref Range   Sodium 139 135 - 145 mmol/L   Potassium 4.1 3.5 - 5.1 mmol/L   Chloride 108 98 - 111 mmol/L   CO2 18 (L) 22 - 32 mmol/L   Glucose, Bld 101 (H) 70 - 99 mg/dL   BUN 8 6 - 20 mg/dL   Creatinine, Ser 0.76 0.44 - 1.00 mg/dL   Calcium 9.0 8.9 - 10.3 mg/dL   Total Protein 7.0 6.5 - 8.1 g/dL   Albumin 3.8 3.5 - 5.0 g/dL   AST 19 15 - 41 U/L   ALT 16 0 - 44 U/L   Alkaline Phosphatase 100 38 - 126 U/L   Total Bilirubin 0.8 0.3 - 1.2 mg/dL   GFR, Estimated >60 >60 mL/min   Anion gap 13 5 - 15  CBC  Result Value Ref Range   WBC 18.4 (H) 4.0 - 10.5 K/uL   RBC 5.00 3.87 - 5.11 MIL/uL   Hemoglobin 14.5 12.0 - 15.0 g/dL   HCT 46.1 (H) 36.0 - 46.0 %   MCV 92.2 80.0 - 100.0 fL   MCH 29.0 26.0 - 34.0 pg   MCHC 31.5 30.0 - 36.0 g/dL   RDW 15.5 11.5 - 15.5 %   Platelets 344 150 - 400 K/uL   nRBC 0.0 0.0 - 0.2 %  Urinalysis,  Routine w reflex microscopic Urine, Clean Catch  Result Value Ref Range   Color, Urine YELLOW YELLOW   APPearance CLEAR CLEAR   Specific Gravity, Urine 1.045 (H) 1.005 - 1.030   pH 6.0 5.0 - 8.0   Glucose, UA NEGATIVE NEGATIVE mg/dL   Hgb urine dipstick NEGATIVE NEGATIVE   Bilirubin Urine NEGATIVE NEGATIVE   Ketones, ur NEGATIVE NEGATIVE mg/dL   Protein, ur NEGATIVE NEGATIVE mg/dL   Nitrite NEGATIVE NEGATIVE   Leukocytes,Ua NEGATIVE NEGATIVE  I-Stat beta hCG blood, ED  Result Value Ref Range   I-stat hCG, quantitative <5.0 <5 mIU/mL   Comment 3            EKG None  Radiology CT ABDOMEN PELVIS W CONTRAST  Result Date: 06/14/2021 CLINICAL DATA:  Right lower quadrant pain. EXAM: CT ABDOMEN AND PELVIS WITH CONTRAST TECHNIQUE: Multidetector CT imaging of the abdomen and pelvis was performed using the standard protocol following bolus administration of intravenous contrast. CONTRAST:  83m OMNIPAQUE IOHEXOL 350 MG/ML SOLN COMPARISON:  Abdominal ultrasound, dated April 02, 2021 and MR abdomen dated January 06, 2021. FINDINGS: Lower chest: No acute abnormality. Hepatobiliary: There is diffuse fatty infiltration of the liver parenchyma.  A 1.4 cm x 0.8 cm focus of parenchymal low attenuation is seen within the posterior aspect of the right lobe of the liver (axial CT image 28, CT series 3/coronal reformatted image 88, CT series 6). This is not clearly identified on the prior studies. Status post cholecystectomy. No biliary dilatation. Pancreas: Unremarkable. No pancreatic ductal dilatation or surrounding inflammatory changes. Spleen: Normal in size without focal abnormality. Adrenals/Urinary Tract: Adrenal glands are unremarkable. Kidneys are normal, without renal calculi, focal lesion, or hydronephrosis. Bladder is unremarkable. Stomach/Bowel: There is a small hiatal hernia. Appendix appears normal. No evidence of bowel wall thickening, distention, or inflammatory changes. Vascular/Lymphatic: No  significant vascular findings are present. No enlarged abdominal or pelvic lymph nodes. Reproductive: An IUD is in place. Uterus and bilateral adnexa are unremarkable. Other: No abdominal wall hernia or abnormality. No abdominopelvic ascites. Musculoskeletal: No acute or significant osseous findings. IMPRESSION: 1. Hepatic steatosis. 2. Small hiatal hernia. 3. Evidence of prior cholecystectomy. 4. Findings likely consistent with a small hepatic cyst versus hemangioma. Correlation with nonemergent hepatic ultrasound is recommended. Electronically Signed   By: Virgina Norfolk M.D.   On: 06/14/2021 23:36   US PELVIC COMPLETE W TRANSVAGINAL AND TORSION R/O  Result Date: 06/15/2021 CLINICAL DATA:  Right lower quadrant pain EXAM: TRANSABDOMINAL AND TRANSVAGINAL ULTRASOUND OF PELVIS DOPPLER ULTRASOUND OF OVARIES TECHNIQUE: Both transabdominal and transvaginal ultrasound examinations of the pelvis were performed. Transabdominal technique was performed for global imaging of the pelvis including uterus, ovaries, adnexal regions, and pelvic cul-de-sac. It was necessary to proceed with endovaginal exam following the transabdominal exam to visualize the ovaries and endometrium. Color and duplex Doppler ultrasound was utilized to evaluate blood flow to the ovaries. COMPARISON:  Preceding abdominal CT FINDINGS: Uterus Measurements: 8 x 5 x 7 cm = volume: 140 mL. No fibroids or other mass visualized. Small volume fluid is seen in the endocervical canal, nonspecific. Endometrium 6-7 mm thickness. There is an IUD which is low with crossbar 2.5 cm below the fundic endometrial convexity. By ultrasound and CT, no serosal penetration. Right ovary Measurements: 26 x 22 x 18 mm = volume: 5 mL. Normal appearance/no adnexal mass. Left ovary Measurements: 33 x 18 x 22 mm = volume: 7 mL. Normal appearance/no adnexal mass. Pulsed Doppler evaluation of both ovaries demonstrates normal low-resistance arterial and venous waveforms. Other  findings No abnormal free fluid. IMPRESSION: 1. Normal ovaries and blood flow. 2. Low positioned IUD. Electronically Signed   By: Jorje Guild M.D.   On: 06/15/2021 04:18    Procedures Procedures   Medications Ordered in ED Medications  ondansetron (ZOFRAN) injection 4 mg (4 mg Intravenous Given 06/14/21 2252)  HYDROmorphone (DILAUDID) injection 0.5 mg (0.5 mg Intravenous Given 06/14/21 2253)  iohexol (OMNIPAQUE) 350 MG/ML injection 80 mL (80 mLs Intravenous Contrast Given 06/14/21 2327)  HYDROmorphone (DILAUDID) injection 0.5 mg (0.5 mg Intravenous Given 06/15/21 0359)    ED Course  I have reviewed the triage vital signs and the nursing notes.  Pertinent labs & imaging results that were available during my care of the patient were reviewed by me and considered in my medical decision making (see chart for details).    MDM Rules/Calculators/A&P                           Patient to ED with RLQ abdominal pain for 2-3 days, nausea, vomiting. No fever.   Labs reviewed. Leukocytosis of 18, no urinary infection, denies vaginal discharge. She  is 6 weeks post-partum but had a normal post-partum exam by OB. CT scan negative for appy, or other abdominal abnormalities. [place] ordered to evaluate for torsion and is negative.   Patient has required multiple doses of pain medication. Nausea controlled with Zofran x 1. She is tearful on discussion of negative results of evaluation and is worried something is being missed. Patient reassured.   Strongly encouraged 2 day recheck with her doctor, and return to the ED with any new or concerning symptoms.   Final Clinical Impression(s) / ED Diagnoses Final diagnoses:  RLQ abdominal pain    Rx / DC Orders ED Discharge Orders     None        Charlann Lange, PA-C 06/15/21 0520    Merryl Hacker, MD 06/16/21 [phone]

## 2021-06-15 ENCOUNTER — Emergency Department (HOSPITAL_COMMUNITY): Payer: Medicaid Other

## 2021-06-15 LAB — RESP PANEL BY RT-PCR (FLU A&B, COVID) ARPGX2
Influenza A by PCR: NEGATIVE
Influenza B by PCR: NEGATIVE
SARS Coronavirus 2 by RT PCR: NEGATIVE

## 2021-06-15 LAB — URINALYSIS, ROUTINE W REFLEX MICROSCOPIC
Bilirubin Urine: NEGATIVE
Glucose, UA: NEGATIVE mg/dL
Hgb urine dipstick: NEGATIVE
Ketones, ur: NEGATIVE mg/dL
Leukocytes,Ua: NEGATIVE
Nitrite: NEGATIVE
Protein, ur: NEGATIVE mg/dL
Specific Gravity, Urine: 1.045 — ABNORMAL HIGH (ref 1.005–1.030)
pH: 6 (ref 5.0–8.0)

## 2021-06-15 LAB — WET PREP, GENITAL
Clue Cells Wet Prep HPF POC: NONE SEEN
Sperm: NONE SEEN
Trich, Wet Prep: NONE SEEN
Yeast Wet Prep HPF POC: NONE SEEN

## 2021-06-15 LAB — GC/CHLAMYDIA PROBE AMP (~~LOC~~) NOT AT ARMC
Chlamydia: NEGATIVE
Comment: NEGATIVE
Comment: NORMAL
Neisseria Gonorrhea: NEGATIVE

## 2021-06-15 MED ORDER — HYDROMORPHONE HCL 1 MG/ML IJ SOLN
0.5000 mg | Freq: Once | INTRAMUSCULAR | Status: AC
  Administered 2021-06-15: 0.5 mg via INTRAVENOUS
  Filled 2021-06-15: qty 1

## 2021-06-15 MED ORDER — HYDROCODONE-ACETAMINOPHEN 5-325 MG PO TABS: 1.0000 | ORAL_TABLET | ORAL | 0 refills | Status: DC | PRN

## 2021-06-15 MED ORDER — ONDANSETRON 4 MG PO TBDP: 4.0000 mg | ORAL_TABLET | Freq: Three times a day (TID) | ORAL | 0 refills | Status: DC | PRN

## 2021-06-15 NOTE — Discharge Instructions (Signed)
Take the medications as prescribed. Follow up with your doctor in 2 days for re-examination of your abdominal pain.   Return to the ED with any new or concerning symptoms.

## 2021-06-15 NOTE — ED Notes (Signed)
Pt hooked up to the monitor but then taken to [place].

## 2021-06-15 NOTE — ED Notes (Signed)
[place] notified RN of pt experiencing severe pain. PA [place] notified, this RN gave pain medication while pt was in [place].

## 2021-06-15 NOTE — ED Notes (Signed)
Pt verbalized understanding of discharge instructions. Pt wheeled out to lobby, family/friend picking pt up.

## 2021-06-24 ENCOUNTER — Telehealth: Payer: Self-pay

## 2021-06-24 ENCOUNTER — Ambulatory Visit: Payer: Medicaid Other | Admitting: Family Medicine

## 2021-06-24 NOTE — Telephone Encounter (Signed)
Called patient to check on her and follow up from her call on Monday.  When she informed us her newborn had passed unexpectedly on 06-24-2023  Kathrene Alu RN

## 2021-07-08 ENCOUNTER — Telehealth: Payer: Self-pay

## 2021-07-08 NOTE — Telephone Encounter (Signed)
Called and left patient a message and told her she could return call to office or send my chart message. We were just calling to check on her. Kathrene Alu RN

## 2021-07-15 ENCOUNTER — Encounter (HOSPITAL_COMMUNITY): Payer: Self-pay | Admitting: Radiology

## 2021-10-19 ENCOUNTER — Telehealth: Payer: Self-pay | Admitting: General Practice

## 2021-10-19 NOTE — Telephone Encounter (Signed)
-----   Message from Maurine Minister, [place] sent at 06/10/2021  9:39 AM EDT ----- Regarding: Follow up Return in about 6 months (around 12/08/2021) for f/u urinary incontinence with Dr. Nehemiah Settle.

## 2021-10-19 NOTE — Telephone Encounter (Signed)
Left message VM for patient to contact our office to schedule 6 month follow up in March 2023 with Dr. Nehemiah Settle.

## 2021-10-20 ENCOUNTER — Ambulatory Visit: Payer: Medicaid Other | Admitting: Obstetrics & Gynecology

## 2021-10-20 ENCOUNTER — Encounter: Payer: Self-pay | Admitting: Obstetrics & Gynecology

## 2021-10-20 ENCOUNTER — Other Ambulatory Visit: Payer: Self-pay

## 2021-10-20 VITALS — BP 139/82 | HR 104 | Wt 222.0 lb

## 2021-10-20 DIAGNOSIS — N764 Abscess of vulva: Secondary | ICD-10-CM

## 2021-10-20 NOTE — Progress Notes (Signed)
History:  36 y.o. [id number] here today for eval of painful lesion on vulva with wiping. Pt reports that she is on her menses and wasn't sure it the pain was related. The sx have been present for about 3 days. She has never had anything like this.    The following portions of the patient's history were reviewed and updated as appropriate: allergies, current medications, past family history, past medical history, past social history, past surgical history and problem list.  Review of Systems:  Pertinent items are noted in HPI.    Objective:  Physical Exam Blood pressure 139/82, pulse (!) 104, weight 222 lb (100.7 kg), last menstrual period 10/14/2021, currently breastfeeding.  CONSTITUTIONAL: Well-developed, well-nourished female in no acute distress.  HENT:  Normocephalic, atraumatic EYES: Conjunctivae and EOM are normal. No scleral icterus.  NECK: Normal range of motion SKIN: Skin is warm and dry. No rash noted. Not diaphoretic.No pallor. [place]: Alert and oriented to person, place, and time. Normal coordination.  Pelvic: On th eright labia majus there is a small abscess 2.5cm. This is tender to touch but, is not fluctuant. There is no surrounding erythemas. The lesion is not pointing at all. The rest of the external genitalia is WNL. There is blood noted from the vaginal vault. Internal exam not done.     Assessment & Plan:  Vulvar abscess- small  Reviewed conservative management vs attempting to drain. It is very small and drainage likely not helpful.   Warm compresses 5x/ day until lesion drains.  F/u if abscess gets larger and more painful.  F/u in 1 year for annual.    Total face-to-face time with patient, review of chart, discussion with consultant and coordination of care was 10min.    Josip Merolla L. Harraway-Smith, M.D., [place] June

## 2021-10-20 NOTE — Progress Notes (Signed)
Patient noticed a pain and boil like spot about three days ago. Patient denies any fever. Kathrene Alu RN

## 2021-11-24 ENCOUNTER — Encounter: Payer: Self-pay | Admitting: Family Medicine

## 2021-11-24 ENCOUNTER — Other Ambulatory Visit: Payer: Self-pay

## 2021-11-24 ENCOUNTER — Ambulatory Visit (INDEPENDENT_AMBULATORY_CARE_PROVIDER_SITE_OTHER): Payer: Medicaid Other | Admitting: Family Medicine

## 2021-11-24 VITALS — BP 125/79 | HR 93 | Ht 64.0 in | Wt 229.0 lb

## 2021-11-24 DIAGNOSIS — Z8632 Personal history of gestational diabetes: Secondary | ICD-10-CM

## 2021-11-24 DIAGNOSIS — Z30432 Encounter for removal of intrauterine contraceptive device: Secondary | ICD-10-CM | POA: Diagnosis not present

## 2021-11-24 DIAGNOSIS — E038 Other specified hypothyroidism: Secondary | ICD-10-CM

## 2021-11-24 NOTE — Progress Notes (Signed)
Patient presents with a desire to have IUD removed.  She recently delivered on 04/30/2021.  She had a post placental IUD placed. The baby died of SIDS. I discussed risks of malnutrition, SGA, preterm birth, postpartum depression. Patient still adamant about IUD removal.    IUD Removal  Patient was in the dorsal lithotomy position, normal external genitalia was noted.  A speculum was placed in the patient's vagina, normal discharge was noted, no lesions. The multiparous cervix was visualized, no lesions, no abnormal discharge,  and was swabbed with Betadine using scopettes.  The strings of the IUD was grasped and pulled using ring forceps.  The IUD was successfully removed in its entirety.  Patient tolerated the procedure well.

## 2021-11-24 NOTE — Progress Notes (Signed)
Patient would like IUD removed today. Kathrene Alu RN

## 2021-11-25 LAB — THYROID PANEL WITH TSH
Free Thyroxine Index: 0.9 — ABNORMAL LOW (ref 1.2–4.9)
T3 Uptake Ratio: 24 % (ref 24–39)
T4, Total: 3.6 ug/dL — ABNORMAL LOW (ref 4.5–12.0)
TSH: 7.52 u[IU]/mL — ABNORMAL HIGH (ref 0.450–4.500)

## 2021-11-25 LAB — HEMOGLOBIN A1C
Est. average glucose Bld gHb Est-mCnc: 114 mg/dL
Hgb A1c MFr Bld: 5.6 % (ref 4.8–5.6)

## 2021-11-26 MED ORDER — LEVOTHYROXINE SODIUM 100 MCG PO TABS: 100.0000 ug | ORAL_TABLET | Freq: Every day | ORAL | 3 refills | Status: DC

## 2021-11-26 NOTE — Addendum Note (Signed)
Addended by: Truett Mainland on: 11/26/2021 12:03 PM   Modules accepted: Orders

## 2022-01-11 ENCOUNTER — Other Ambulatory Visit: Payer: Medicaid Other

## 2022-01-11 ENCOUNTER — Other Ambulatory Visit (INDEPENDENT_AMBULATORY_CARE_PROVIDER_SITE_OTHER): Payer: Medicaid Other

## 2022-01-11 DIAGNOSIS — R7989 Other specified abnormal findings of blood chemistry: Secondary | ICD-10-CM

## 2022-01-11 NOTE — Progress Notes (Addendum)
Pt presents for TSH lab. Pt was sent to the lab to have labs drawn. ?Abimbola Aki l Yahye Siebert, CMA  ? ?Patient was assessed and managed by nursing staff during this encounter. I have reviewed the chart and agree with the documentation and plan. I have also made any necessary editorial changes. ? ?Hansel Feinstein, CNM ?01/12/2022 11:44 PM  ? ?

## 2022-01-12 ENCOUNTER — Ambulatory Visit: Payer: Medicaid Other | Admitting: Obstetrics & Gynecology

## 2022-01-12 LAB — TSH: TSH: 0.596 u[IU]/mL (ref 0.450–4.500)

## 2022-01-18 ENCOUNTER — Ambulatory Visit: Payer: Medicaid Other | Admitting: Advanced Practice Midwife

## 2022-01-18 ENCOUNTER — Encounter: Payer: Self-pay | Admitting: Advanced Practice Midwife

## 2022-01-18 VITALS — BP 134/83 | HR 120 | Wt 214.0 lb

## 2022-01-18 DIAGNOSIS — N644 Mastodynia: Secondary | ICD-10-CM | POA: Diagnosis not present

## 2022-01-18 NOTE — Progress Notes (Signed)
Patient states that her left nipple was so sore and had a spot that did drain from the area. Patient states she had same spot come back last week. Kathrene Alu RN  ?

## 2022-01-18 NOTE — Progress Notes (Signed)
GYNECOLOGY ANNUAL PREVENTATIVE CARE ENCOUNTER NOTE ? ?Subjective:  ? Michele Armstrong is a 36 y.o. [id number] female here for complaints of intermittent abscess/lump on left nipple.  Had a large abscess that drained weeks ago, treated with antibiotics.  Has since had some tenderness at times and expresses a little while fluid from area just adjacent to tip of nipple  Not sure if it is milk or from abscess.  .   Denies abnormal vaginal bleeding, discharge, pelvic pain, problems with intercourse or other gynecologic concerns.  ?  ?Recently had a SIDS death of her baby in 2021-07-09 ? ?Obstetric History ?OB History  ?Gravida Para Term Preterm AB Living  ?'3 2 2   1 2  '$ ?SAB IAB Ectopic Multiple Live Births  ?      0 2  ?  ?# Outcome Date GA Lbr Len/2nd Weight Sex Delivery Anes PTL Lv  ?3 Term 04/30/21 [id number]:51 / 00:09 5 lb 10.8 oz (2.574 kg) M Vag-Spont EPI  LIV  ?2 Term 11/24/16 427w1d 00:35 7 lb 2.5 oz (3.246 kg) M Vag-Spont EPI  LIV  ?1 AB           ? ? ?Past Medical History:  ?Diagnosis Date  ? Anxiety   ? Depression   ? Fatigue   ? Gall stone   ? Gestational diabetes   ? Headache(784.0)   ? Hypothyroid   ? Internal hemorrhoids   ? PTSD (post-traumatic stress disorder)   ? tachycardia   ? tachycardia  ? ? ?Past Surgical History:  ?Procedure Laterality Date  ? CHOLECYSTECTOMY    ? FLEXIBLE SIGMOIDOSCOPY N/A 06/01/2017  ? Procedure: FLEXIBLE SIGMOIDOSCOPY;  Surgeon: JaMilus BanisterMD;  [facility];  Service: Endoscopy;  Laterality: N/A;  ? HEMORRHOID BANDING  2018  ? WISDOM TOOTH EXTRACTION    ? ? ?Current Outpatient Medications on File Prior to Visit  ?Medication Sig Dispense Refill  ? DULoxetine (CYMBALTA) 60 MG capsule Take 60 mg by mouth daily.    ? lamoTRIgine (LAMICTAL) 25 MG tablet Take 25 mg by mouth daily.    ? levothyroxine (SYNTHROID) 100 MCG tablet Take 1 tablet (100 mcg total) by mouth daily before breakfast. 90 tablet 3  ? Prenat-FeAsp-Meth-FA-DHA w/o A (PRENATE PIXIE) 10-0.6-0.4-200 MG  CAPS Take 1 capsule by mouth daily.    ? cetirizine (ZYRTEC) 10 MG tablet Take 10 mg by mouth daily. (Patient not taking: Reported on 01/18/2022)    ? clonazePAM (KLONOPIN) 0.5 MG tablet Take 1 tablet (0.5 mg total) by mouth 2 (two) times daily as needed for up to 5 days for anxiety. 10 tablet 0  ? HYDROcodone-acetaminophen (NORCO/VICODIN) 5-325 MG tablet Take 1 tablet by mouth every 4 (four) hours as needed. (Patient not taking: Reported on 01/18/2022) 8 tablet 0  ? ondansetron (ZOFRAN ODT) 4 MG disintegrating tablet Take 1 tablet (4 mg total) by mouth every 8 (eight) hours as needed for nausea or vomiting. (Patient not taking: Reported on 01/18/2022) 20 tablet 0  ? ?No current facility-administered medications on file prior to visit.  ? ? ?Allergies  ?Allergen Reactions  ? Morphine And Related Nausea And Vomiting  ? Latex Rash  ?  Causes UTI when condom used.  ? ? ?Social History  ? ?Socioeconomic History  ? Marital status: Divorced  ?  Spouse name: Not on file  ? Number of children: 1  ? Years of education: Not on file  ? Highest education level: Not on file  ?  Occupational History  ? Occupation: homemaker  ?Tobacco Use  ? Smoking status: Former  ?  Types: Cigarettes  ?  Quit date: 01/01/2021  ?  Years since quitting: 1.0  ? Smokeless tobacco: Never  ? Tobacco comments:  ?  quit smoking about 2 days ago  ?Vaping Use  ? Vaping Use: Former  ? Substances: Nicotine, Flavoring  ?Substance and Sexual Activity  ? Alcohol use: Not Currently  ? Drug use: Not Currently  ?  Types: Marijuana  ? Sexual activity: Yes  ?  Partners: Male  ?Other Topics Concern  ? Not on file  ?Social History Narrative  ? Married, homemaker, son Malachi born 2018  ?   ? Patient has moved back to [place] from [place] due to [place] having mental issues (schizo with hallucinations)  ? ?Social Determinants of Health  ? ?Financial Resource Strain: Not on file  ?Food Insecurity: Not on file  ?Transportation Needs: Not on file  ?Physical  Activity: Not on file  ?Stress: Not on file  ?Social Connections: Not on file  ?Intimate Partner Violence: Not on file  ? ? ?Family History  ?Problem Relation Age of Onset  ? Alcohol abuse Father   ? Drug abuse Father   ? Hypertension Father   ? Hypertension Mother   ? Diabetes Mother   ? Leukemia Maternal Grandmother   ? Breast cancer Maternal Aunt   ? Colon cancer Neg Hx   ? Stomach cancer Neg Hx   ? ? ?The following portions of the patient's history were reviewed and updated as appropriate: allergies, current medications, past family history, past medical history, past social history, past surgical history and problem list. ? ?Review of Systems ?Pertinent items noted in HPI and remainder of comprehensive ROS otherwise negative. ?  ?Objective:  ?BP 134/83   Pulse (!) 120   Wt 214 lb (97.1 kg)   LMP 01/03/2022 (Exact Date)   Breastfeeding No   BMI 36.73 kg/m?  ?CONSTITUTIONAL: Well-developed, well-nourished female in no acute distress.  ?PSYCHIATRIC: Normal mood and affect. Normal behavior. Normal judgment and thought content. ? ?BREASTS: Symmetric in size. No skin changes, nipple drainage, or lymphadenopathy.  There is a tiny area of induration on left nipple at what appears to be [place] gland.  No erethema   Unable to appreciate any sign of abscess. ? ? ?Assessment:  ?Left nipple abscess, resolving.  ?Anxiety over pain and induration. ?  ?Plan:  ?Discussed this has probably mostly resolved ?Likely abscess of the oil gland there ?Since she has pain and is worried about the area, will order a breast US to confirm that there are no suspicious areas there.  ? ? ? ?

## 2022-01-19 ENCOUNTER — Telehealth: Payer: Self-pay | Admitting: General Practice

## 2022-01-19 ENCOUNTER — Other Ambulatory Visit: Payer: Self-pay | Admitting: Advanced Practice Midwife

## 2022-01-19 DIAGNOSIS — N644 Mastodynia: Secondary | ICD-10-CM

## 2022-01-19 NOTE — Telephone Encounter (Signed)
Informed patient of appt with [facility] in [place] on 02/04/2022 at 1:00pm.  Pt verbalized understanding. ?

## 2022-02-04 ENCOUNTER — Ambulatory Visit
Admission: RE | Admit: 2022-02-04 | Discharge: 2022-02-04 | Disposition: A | Discharge status: Home / Self Care | Payer: Medicaid Other | Source: Ambulatory Visit | Attending: Advanced Practice Midwife | Admitting: Advanced Practice Midwife

## 2022-02-04 DIAGNOSIS — N644 Mastodynia: Secondary | ICD-10-CM

## 2022-12-24 ENCOUNTER — Other Ambulatory Visit: Payer: Self-pay | Admitting: Family Medicine

## 2022-12-28 ENCOUNTER — Other Ambulatory Visit: Payer: Self-pay

## 2022-12-28 MED ORDER — LEVOTHYROXINE SODIUM 100 MCG PO TABS: 100.0000 ug | ORAL_TABLET | Freq: Every day | ORAL | 0 refills | Status: DC

## 2022-12-28 NOTE — Telephone Encounter (Signed)
Responded in another way

## 2023-02-07 IMAGING — MR MR PELVIS W/O CM
14 of 16 series · 38 of 48 positions shown · non-contrast
Comparison: None.

CLINICAL DATA: Twenty weeks pregnant with abdominal pain.

EXAM:
MRI ABDOMEN AND PELVIS WITHOUT CONTRAST
TECHNIQUE: Multiplanar multisequence MR imaging of the abdomen and pelvis was
performed. No intravenous contrast was administered.

[Series 4: cor haste · coronal · 5.0mm · 1.04mm/px · 4 of 45 slices shown]
[im 1/45]
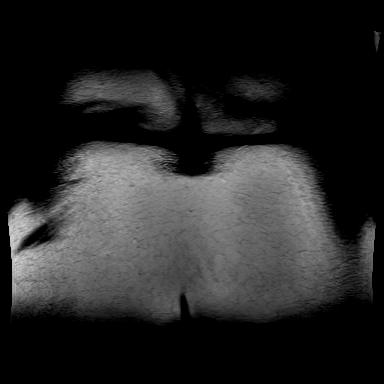
[im 15/45]
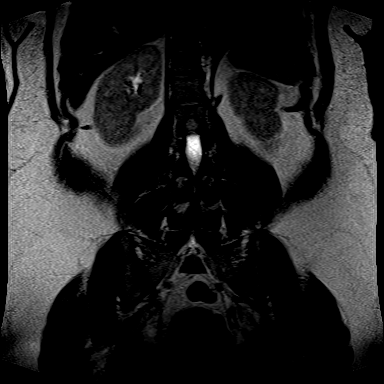
[im 30/45]
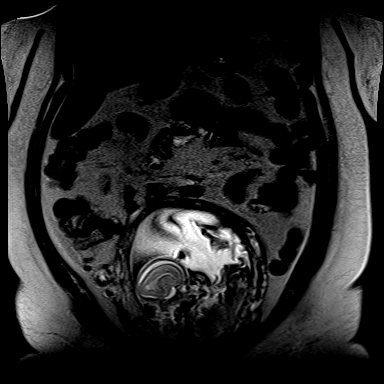
[im 45/45]
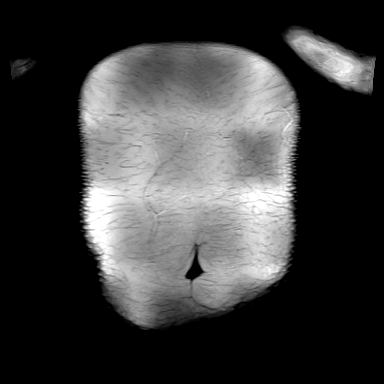

[Series 6: cor haste fs · coronal · 5.0mm · 1.04mm/px · 4 of 44 slices shown]
[im 1/44]
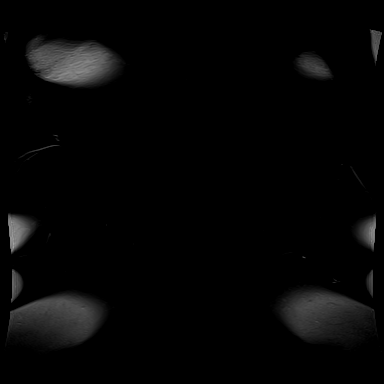
[im 15/44]
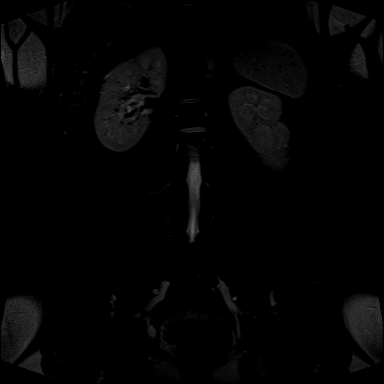
[im 29/44]
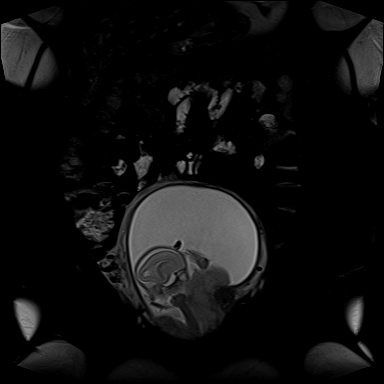
[im 44/44]
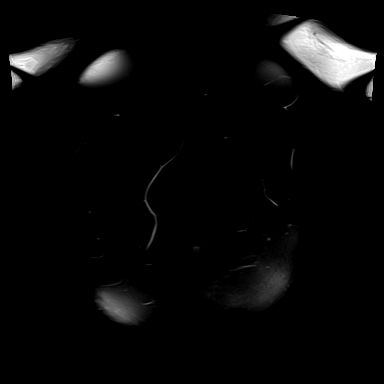

[Series 8: bSSFP · coronal · 5.0mm · 1.90mm/px · 3 of 45 slices shown (1 of 3)]
[im 1/45]
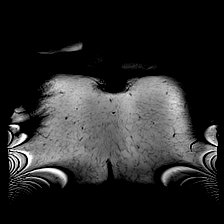
[im 23/45]
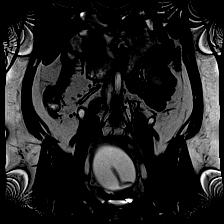
[im 45/45]
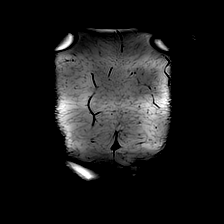

[Series 11: ax haste fs · axial · 5.0mm · 0.99mm/px · z∈[-87,+87]mm · 2 of 30 slices shown]
[im 1/30]
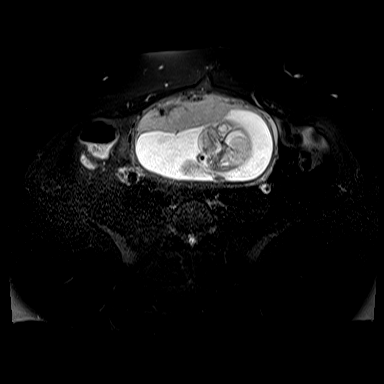
[im 30/30]
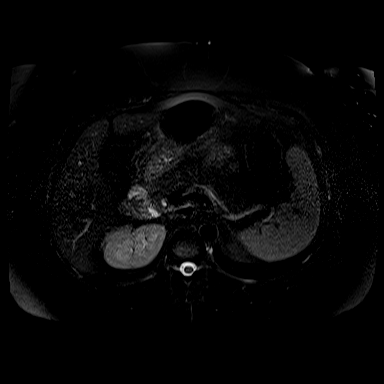

[Series 14: ax haste fs_comp · axial · 5.0mm · 1.19mm/px · z∈[-267,-93]mm · 2 of 30 slices shown (1 of 2)]
[im 1/30]
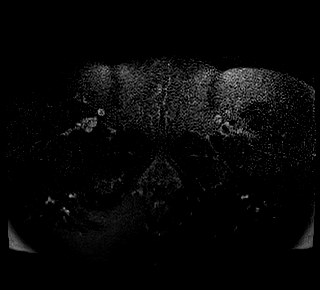
[im 30/30]
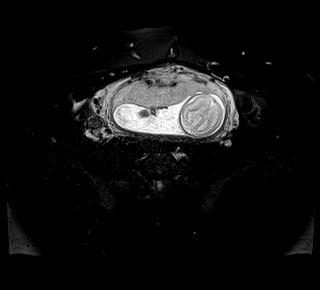

[Series 14: ax haste fs_comp · axial · 5.0mm · 0.99mm/px · z∈[-87,+87]mm · 2 of 30 slices shown (2 of 2)]
[im 1/30]
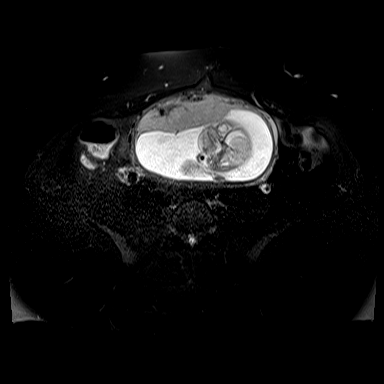
[im 30/30]
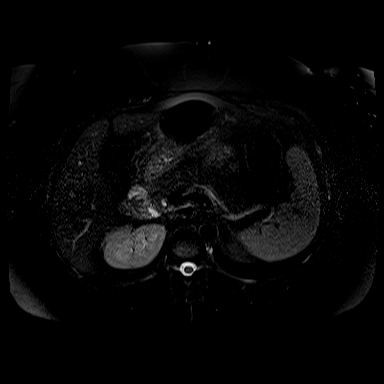

[Series 20: T2 fat-sat · axial · 5.0mm · 1.19mm/px · z∈[-267,+87]mm · 4 of 60 slices shown]
[im 1/60]
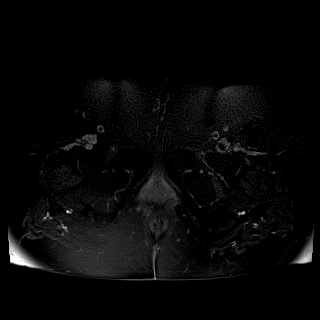
[im 20/60]
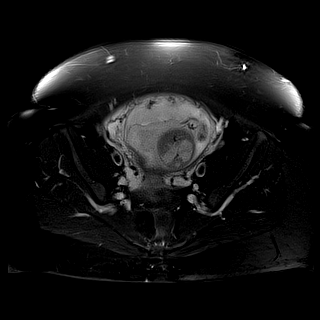
[im 40/60]
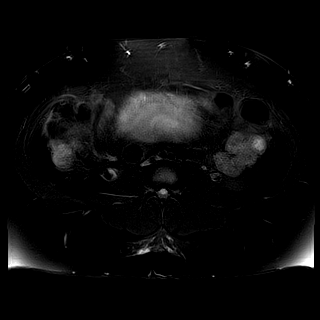
[im 60/60]
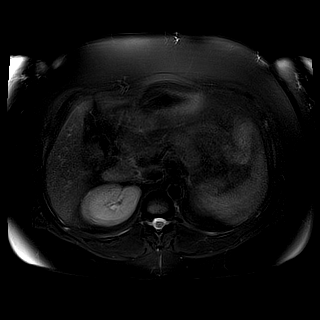

[Series 25: ax haste_comp · axial · 5.0mm · 0.99mm/px · z∈[-74,+100]mm · 2 of 30 slices shown (1 of 2)]
[im 1/30]
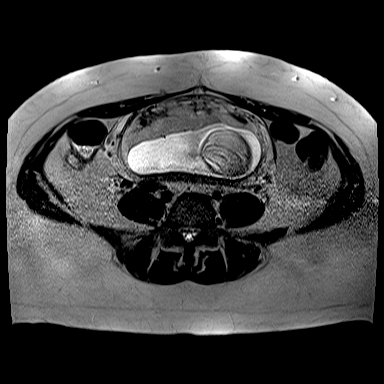
[im 30/30]
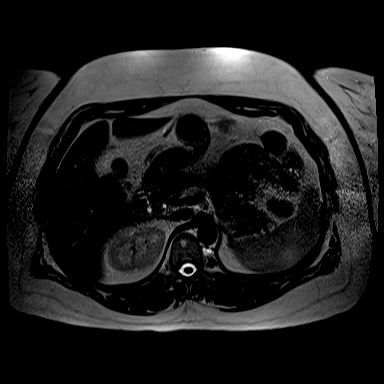

[Series 25: ax haste_comp · axial · 5.0mm · 1.19mm/px · z∈[-254,-80]mm · 2 of 30 slices shown (2 of 2)]
[im 1/30]
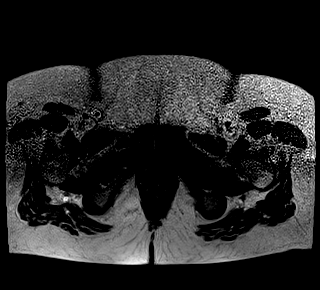
[im 30/30]
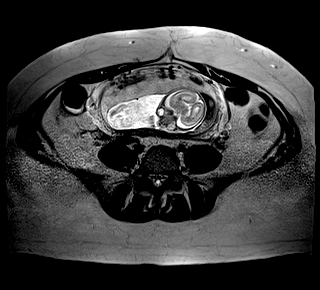

[Series 28: bSSFP · axial · 5.0mm · 0.74mm/px · z∈[-254,-80]mm · 2 of 30 slices shown (2 of 3)]
[im 1/30]
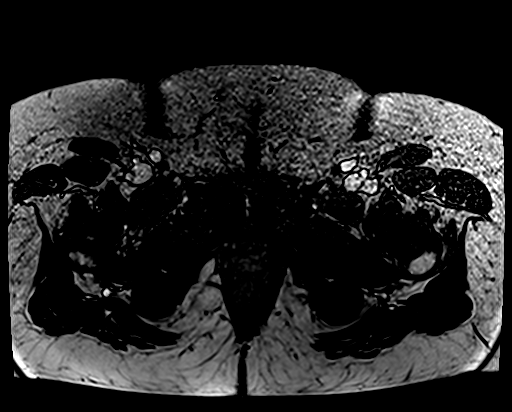
[im 30/30]
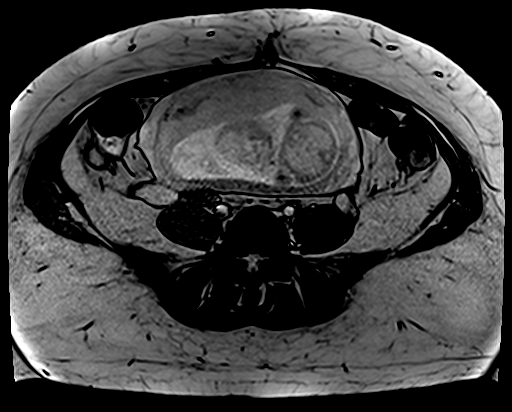

[Series 28: bSSFP · axial · 5.0mm · 1.61mm/px · z∈[-74,+100]mm · 2 of 30 slices shown (3 of 3)]
[im 1/30]
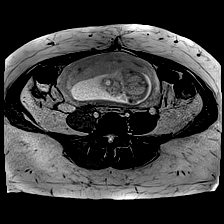
[im 30/30]
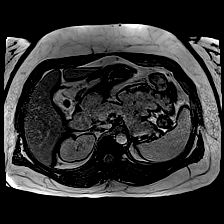

[Series 31: T1 · axial · 6.0mm · 1.48mm/px · z∈[-269,-60]mm · 2 of 30 slices shown (1 of 2)]
[im 1/30]
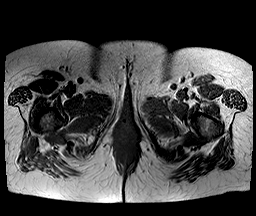
[im 30/30]
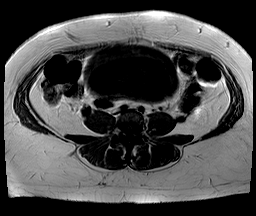

[Series 31: T1 · axial · 3.0mm · 0.70mm/px · z∈[-52,+52]mm · 2 of 30 slices shown (2 of 2)]
[im 1/30]
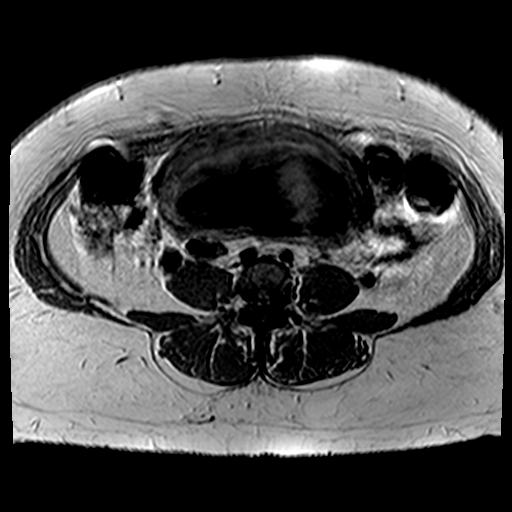
[im 30/30]
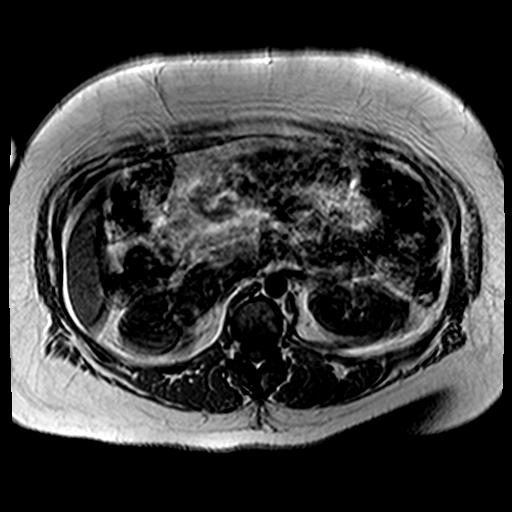

[Series 34: T1 dynamic · axial · 3.0mm · 1.41mm/px · z∈[-51,+138]mm · 5 of 80 slices shown]
[im 1/80]
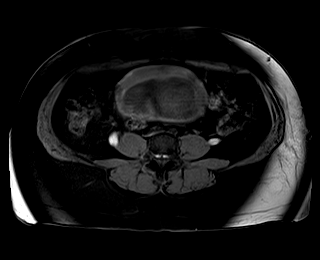
[im 16/80]
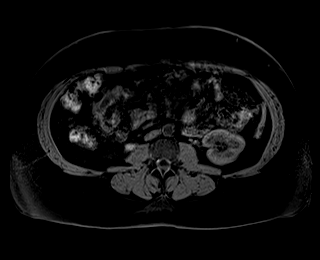
[im 32/80]
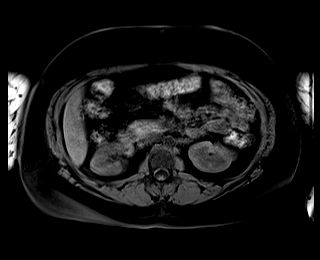
[im 48/80]
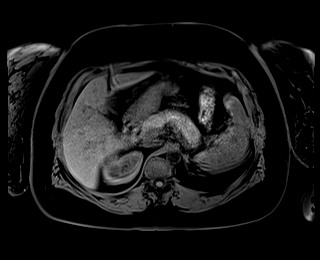
[im 64/80]
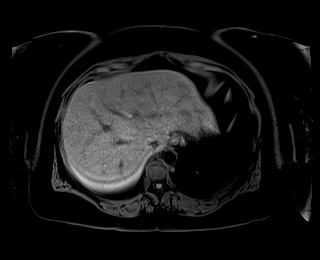

[38 of 48 positions shown; findings below may reference images not displayed]

FINDINGS: COMBINED FINDINGS FOR BOTH MR ABDOMEN AND PELVIS

Lower chest: Very limited imaging of the lower chest based on field
of view without gross effusion or consolidation.

Hepatobiliary: Liver with limited coverage as well, grossly normal.
Post cholecystectomy without biliary duct distension.

Pancreas: No sign of peripancreatic edema within visualized portions
of the pancreas on T2 weighted imaging. Normal intrinsic T1 signal
throughout the pancreas. No sign of pancreatic ductal dilation.

Spleen:  Normal size and contour.

Adrenals/Urinary Tract:  Limited assessment, no gross abnormality.

No sign of hydronephrosis. Smooth renal contours without perinephric
stranding. Limited assessment of all upper abdominal viscera due to
coverage as described. Seen best on coronal images.

Stomach/Bowel: Appendix is normal. No pericecal inflammation. No
small bowel dilation.

Vascular/Lymphatic: Limited assessment in the absence of contrast.
No aneurysmal dilation of the abdominal aorta. No adenopathy in the
abdomen or pelvis.

Reproductive: Gravid uterus with anterior placenta. Study not
performed for fetal evaluation. Fetus in breech position. Trace
increased signal seen in the cervix and about the placenta without
retroplacental hematoma. Some of the signal could be flow related
artifact within the placenta.

Ovaries grossly normal with robust venous engorgement of bilateral
ovarian veins and parametrial vessels in the setting of pregnancy.
Coronal images earlier in the study shows variation from sequence to
sequence, likely myometrial contraction.

Other:  No abdominal or pelvic ascites.

Musculoskeletal: No suspicious bone lesions identified.
IMPRESSION: 1. No acute findings within the abdomen or pelvis. Appendix is
normal
2. Gravid uterus with anterior placenta, fetus in breech position at
this point with nuchal cord. Study not performed for fetal
evaluation.
3. Signs of myometrial contraction
4. Trace increased signal on T1 seen in the cervix and about the
placenta without retroplacental hematoma. Correlate with any signs
of vaginal bleeding. Scattered small areas of increased T1 signal in
the placenta could be related to flow related artifact.
5. No hydronephrosis or perinephric stranding.

These results will be called to the ordering clinician or
representative by the Radiologist Assistant, and communication
documented in the PACS or [REDACTED].

## 2023-02-15 ENCOUNTER — Other Ambulatory Visit (HOSPITAL_COMMUNITY)
Admission: RE | Admit: 2023-02-15 | Discharge: 2023-02-15 | Disposition: A | Discharge status: Home / Self Care | Payer: Medicaid Other | Source: Ambulatory Visit | Attending: Family Medicine | Admitting: Family Medicine

## 2023-02-15 ENCOUNTER — Ambulatory Visit (INDEPENDENT_AMBULATORY_CARE_PROVIDER_SITE_OTHER): Payer: Medicaid Other | Admitting: Family Medicine

## 2023-02-15 ENCOUNTER — Encounter: Payer: Self-pay | Admitting: Family Medicine

## 2023-02-15 VITALS — BP 116/70 | HR 94 | Ht 64.0 in | Wt 208.0 lb

## 2023-02-15 DIAGNOSIS — E038 Other specified hypothyroidism: Secondary | ICD-10-CM

## 2023-02-15 DIAGNOSIS — N979 Female infertility, unspecified: Secondary | ICD-10-CM | POA: Diagnosis not present

## 2023-02-15 DIAGNOSIS — N912 Amenorrhea, unspecified: Secondary | ICD-10-CM

## 2023-02-15 DIAGNOSIS — Z01419 Encounter for gynecological examination (general) (routine) without abnormal findings: Secondary | ICD-10-CM | POA: Diagnosis not present

## 2023-02-15 DIAGNOSIS — Z1339 Encounter for screening examination for other mental health and behavioral disorders: Secondary | ICD-10-CM

## 2023-02-15 LAB — POCT URINE PREGNANCY: Preg Test, Ur: NEGATIVE

## 2023-02-15 NOTE — Progress Notes (Signed)
ANNUAL EXAM Patient name: Michele Armstrong MRN [id number]  Date of birth: [birth year] Chief Complaint:   Annual Exam  History of Present Illness:   Michele Armstrong is a 37 y.o.  [phone]  female  being seen today for a routine annual exam.  Current complaints: not able to get pregnant despite trying for over a year. She is having regular cycles - interval about 28-30 days. Did have an extra cycle this month. She does have  anew partner. Both have children.  Patient's last menstrual period was 01/07/2023 (exact date).    Last pap 2021. Results were: NILM w/ HRHPV negative. H/O abnormal pap: no Last mammogram: n/a     02/15/2023    3:49 PM 02/15/2021    9:25 AM 12/16/2020    1:16 PM  Depression screen PHQ 2/9  Decreased Interest 1 0 0  Down, Depressed, Hopeless 1 0 0  PHQ - 2 Score 2 0 0  Altered sleeping 0 0 0  Tired, decreased energy 1 1 1   Change in appetite 1 0 0  Feeling bad or failure about yourself  0 0 0  Trouble concentrating 0 0 0  Moving slowly or fidgety/restless 0 0 0  Suicidal thoughts 0 0 0  PHQ-9 Score 4 1 1         02/15/2023    3:50 PM 02/15/2021    9:25 AM 12/16/2020    1:16 PM  GAD 7 : Generalized Anxiety Score  Nervous, Anxious, on Edge 1 1 1   Control/stop worrying 1 1 1   Worry too much - different things 0 1 3  Trouble relaxing 1 1 3   Restless 2 0 0  Easily annoyed or irritable 1 1 2   Afraid - awful might happen 0 0 1  Total GAD 7 Score 6 5 11      Review of Systems:   Pertinent items are noted in HPI Denies any headaches, blurred vision, fatigue, shortness of breath, chest pain, abdominal pain, abnormal vaginal discharge/itching/odor/irritation, problems with periods, bowel movements, urination, or intercourse unless otherwise stated above. Pertinent History Reviewed:  Reviewed past medical,surgical, social and family history.  Reviewed problem list, medications and allergies. Physical Assessment:   Vitals:   02/15/23 1541  BP: 116/70  Pulse: 94   Weight: 208 lb (94.3 kg)  Height: 5\' 4"  (1.626 m)  Body mass index is 35.7 kg/m.        Physical Examination:   General appearance - well appearing, and in no distress  Mental status - alert, oriented to person, place, and time  Psych:  She has a normal mood and affect  Skin - warm and dry, normal color, no suspicious lesions noted  Chest - effort normal, all lung fields clear to auscultation bilaterally  Heart - normal rate and regular rhythm  Neck:  midline trachea, no thyromegaly or nodules  Breasts - breasts appear normal, no suspicious masses, no skin or nipple changes or axillary nodes  Abdomen - soft, nontender, nondistended, no masses or organomegaly  Pelvic - VULVA: normal appearing vulva with no masses, tenderness or lesions  VAGINA: normal appearing vagina with normal color and discharge, no lesions  CERVIX: normal appearing cervix without discharge or lesions, no CMT  Thin prep pap is done with HR HPV cotesting  UTERUS: uterus is felt to be normal size, shape, consistency and nontender   ADNEXA: No adnexal masses or tenderness noted.  Extremities:  No swelling or varicosities noted  Chaperone present for exam  Assessment &  Plan:  1. Well woman exam with routine gynecological exam  2. Amenorrhea Neg UPT - POCT urine pregnancy  3. Obesity, Class III, BMI 40-49.9 (morbid obesity) (HCC)  4. Other specified hypothyroidism Check TSH  5. Infertility, female Check TSH, HgA1c, FSH, testosterone.  If all normal, then will need semen sample for analysis and possible HSG   Labs/procedures today: as above  Orders Placed This Encounter  Procedures   POCT urine pregnancy    Meds: No orders of the defined types were placed in this encounter.   Follow-up: No follow-ups on file.  Levie Heritage, DO 02/15/2023 4:08 PM

## 2023-02-15 NOTE — Progress Notes (Signed)
Patient reports that her and partner have been trying to get pregnant for approx one year. Armandina Stammer RN

## 2023-02-18 LAB — FOLLICLE STIMULATING HORMONE: FSH: 14.1 m[IU]/mL

## 2023-02-18 LAB — HEMOGLOBIN A1C
Est. average glucose Bld gHb Est-mCnc: 117 mg/dL
Hgb A1c MFr Bld: 5.7 % — ABNORMAL HIGH (ref 4.8–5.6)

## 2023-02-18 LAB — TSH: TSH: 0.598 u[IU]/mL (ref 0.450–4.500)

## 2023-02-20 ENCOUNTER — Encounter: Payer: Self-pay | Admitting: Family Medicine

## 2023-02-20 LAB — CYTOLOGY - PAP
Adequacy: ABSENT
Comment: NEGATIVE
Diagnosis: NEGATIVE
High risk HPV: NEGATIVE

## 2023-02-24 LAB — TESTOSTERONE, FREE, TOTAL, SHBG
Sex Hormone Binding: 56.9 nmol/L (ref 24.6–122.0)
Testosterone, Free: 1.7 pg/mL (ref 0.0–4.2)
Testosterone: 24 ng/dL (ref 8–60)

## 2023-04-04 ENCOUNTER — Other Ambulatory Visit: Payer: Self-pay | Admitting: Family Medicine

## 2023-06-08 ENCOUNTER — Ambulatory Visit (INDEPENDENT_AMBULATORY_CARE_PROVIDER_SITE_OTHER): Payer: Medicaid Other | Admitting: Family Medicine

## 2023-06-08 ENCOUNTER — Encounter: Payer: Self-pay | Admitting: Family Medicine

## 2023-06-08 VITALS — BP 111/77 | HR 96 | Wt 206.0 lb

## 2023-06-08 DIAGNOSIS — Z3202 Encounter for pregnancy test, result negative: Secondary | ICD-10-CM | POA: Diagnosis not present

## 2023-06-08 DIAGNOSIS — N912 Amenorrhea, unspecified: Secondary | ICD-10-CM | POA: Diagnosis not present

## 2023-06-08 LAB — POCT URINE PREGNANCY: Preg Test, Ur: NEGATIVE

## 2023-06-09 ENCOUNTER — Other Ambulatory Visit: Payer: Medicaid Other

## 2023-06-09 ENCOUNTER — Ambulatory Visit (HOSPITAL_BASED_OUTPATIENT_CLINIC_OR_DEPARTMENT_OTHER)
Admission: RE | Admit: 2023-06-09 | Discharge: 2023-06-09 | Disposition: A | Discharge status: Home / Self Care | Payer: Medicaid Other | Source: Ambulatory Visit | Attending: Family Medicine | Admitting: Family Medicine

## 2023-06-09 DIAGNOSIS — N912 Amenorrhea, unspecified: Secondary | ICD-10-CM | POA: Insufficient documentation

## 2023-06-09 MED ORDER — MEDROXYPROGESTERONE ACETATE 10 MG PO TABS: 10.0000 mg | ORAL_TABLET | Freq: Every day | ORAL | 2 refills | Status: DC

## 2023-06-09 NOTE — Progress Notes (Signed)
   Subjective:    Patient ID: Michele Armstrong, female    DOB: [birth year], 37 y.o.   MRN: [id number]  HPI Patient seen for amenorrhea x 3 months. Until then, she had regular monthly menses with normal duration. No new medications - did have an increase in Strattera. She has been compliant with her levothyroxine. No other changes. Denies gallactorrhea, although will get an occasional areolar infection, but these go away fairly quickly. No headaches, vision changes.    Review of Systems     Objective:   Physical Exam Vitals reviewed.  Constitutional:      Appearance: Normal appearance.  HENT:     Head: Normocephalic and atraumatic.  Skin:    Capillary Refill: Capillary refill takes less than 2 seconds.  Neurological:     General: No focal deficit present.     Mental Status: She is alert.  Psychiatric:        Mood and Affect: Mood normal.        Behavior: Behavior normal.        Thought Content: Thought content normal.        Judgment: Judgment normal.        Assessment & Plan:  1. Amenorrhea UPT is negative Recheck TSH - last TSH 3 months ago was in normal range Check prolactin. [place] ordered. Progestin withdrawal following [place].  - POCT urine pregnancy - TSH - Prolactin - US PELVIC COMPLETE WITH TRANSVAGINAL; Future

## 2023-06-10 LAB — PROLACTIN: Prolactin: 3.8 ng/mL — ABNORMAL LOW (ref 4.8–33.4)

## 2023-06-10 LAB — TSH: TSH: 1.07 u[IU]/mL (ref 0.450–4.500)

## 2023-07-03 ENCOUNTER — Encounter: Payer: Self-pay | Admitting: Family Medicine

## 2023-07-17 ENCOUNTER — Encounter: Payer: Self-pay | Admitting: Family Medicine

## 2023-09-07 ENCOUNTER — Encounter: Payer: Self-pay | Admitting: Family Medicine

## 2023-09-07 ENCOUNTER — Ambulatory Visit (INDEPENDENT_AMBULATORY_CARE_PROVIDER_SITE_OTHER): Payer: Medicaid Other | Admitting: Family Medicine

## 2023-09-07 VITALS — BP 119/81 | HR 86 | Wt 213.0 lb

## 2023-09-07 DIAGNOSIS — N979 Female infertility, unspecified: Secondary | ICD-10-CM

## 2023-09-07 DIAGNOSIS — N914 Secondary oligomenorrhea: Secondary | ICD-10-CM | POA: Diagnosis not present

## 2023-09-08 ENCOUNTER — Encounter: Payer: Self-pay | Admitting: Family Medicine

## 2023-09-08 NOTE — Progress Notes (Signed)
Subjective:    Patient ID: Michele Armstrong, female    DOB: Jul 19, 1986, 37 y.o.   MRN: [id number]  HPI  Patient seen for infertility.  Her last pregnancy was in 2022.  Over the last year and a half, the patient has been trying to get pregnant with her partner without success.  Up until May, she has had regular cycles, but since that she has been having oligomenorrhea: 3 to 4 months without menses, followed by menses and September and October, but not in November.  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  She did see a fertility specialist to told her that her chances of responding to hormones to this ovulation be extremely low recommended that she use donor eggs.  She is very emotional about this.  She would really like to use her own eggs.    Review of Systems     Objective:  BP 119/81   Pulse 86   Wt 213 lb (96.6 kg)   LMP 08/02/2023 (Exact Date)   BMI 36.56 kg/m    Physical Exam Vitals reviewed.  Constitutional:      Appearance: Normal appearance.  Skin:    Capillary Refill: Capillary refill takes less than 2 seconds.  Neurological:     General: No focal deficit present.     Mental Status: She is alert and oriented to person, place, and time.  Psychiatric:        Thought Content: Thought content normal.        Judgment: Judgment normal.        Assessment & Plan:  1. Infertility, female I discussed significance of AML with the patient.  A result greater than 1 is correlates with higher rates of egg production as a result of abnormal stimulation.  This would allow greater success for egg retrieval and, with more eggs, higher success rate for fertilization and embryo transfer.  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  She asked what my thoughts were and whether she and her partner should still try.  I explained that with limited finances, better option would be  to use donor eggs as there would more certainty in fertilization of eggs and better possibilities of embryo transfer.  If she were to spend money in hormone stimulation of and have limited egg production with minimal retrieval, she may not get any viable embryos during the examination process.  She was quite emotional during the entire conversation.  She did have a question about whether she has any eggs remaining.  I discussed that the only way to determine this would be an antral follicle count which we will do at the beginning of her next menses.  I discussed that while I can order the ultrasound and have it done with our general ultrasonographer, there may be limitations because our ultrasonographer is a radiologist or nonspecifically trained in looking for these things.  She would still like to try getting an ultrasound, which we will try to do within the first 4 to 5 days of her next menses. - US PELVIC COMPLETE WITH TRANSVAGINAL; Future  2. Secondary oligomenorrhea Hormone testing has been done.  She does not appear like this is PCOS.  With her AML as suppressed as it is, it is likely that she is perimenopausal and will likely have early menopause within the next few years. - US PELVIC COMPLETE WITH TRANSVAGINAL; Future  30  minutes spent with patient in counseling, explaining treatment options, explaining results, etc.

## 2023-09-11 ENCOUNTER — Ambulatory Visit (HOSPITAL_BASED_OUTPATIENT_CLINIC_OR_DEPARTMENT_OTHER)
Admission: RE | Admit: 2023-09-11 | Discharge: 2023-09-11 | Disposition: A | Discharge status: Home / Self Care | Payer: Medicaid Other | Source: Ambulatory Visit | Attending: Family Medicine | Admitting: Family Medicine

## 2023-09-11 ENCOUNTER — Encounter (HOSPITAL_BASED_OUTPATIENT_CLINIC_OR_DEPARTMENT_OTHER): Payer: Self-pay

## 2023-09-11 DIAGNOSIS — N914 Secondary oligomenorrhea: Secondary | ICD-10-CM

## 2023-09-11 DIAGNOSIS — N979 Female infertility, unspecified: Secondary | ICD-10-CM

## 2024-01-31 ENCOUNTER — Other Ambulatory Visit: Payer: Self-pay | Admitting: Family Medicine

## 2024-03-01 ENCOUNTER — Encounter: Payer: Self-pay | Admitting: Family Medicine

## 2024-03-05 MED ORDER — MEDROXYPROGESTERONE ACETATE 10 MG PO TABS: 10.0000 mg | ORAL_TABLET | Freq: Every day | ORAL | 2 refills | Status: AC
